# Patient Record
Sex: Female | Born: 1970 | Race: White | Hispanic: No | Marital: Married | Smoking: Former smoker
Health system: Southern US, Community
[De-identification: ages and names within clinical notes are randomized; demographics above are authoritative.]

## PROBLEM LIST (undated history)

## (undated) DIAGNOSIS — E039 Hypothyroidism, unspecified: Secondary | ICD-10-CM

## (undated) DIAGNOSIS — F419 Anxiety disorder, unspecified: Secondary | ICD-10-CM

## (undated) DIAGNOSIS — I10 Essential (primary) hypertension: Secondary | ICD-10-CM

## (undated) DIAGNOSIS — E559 Vitamin D deficiency, unspecified: Secondary | ICD-10-CM

## (undated) DIAGNOSIS — M722 Plantar fascial fibromatosis: Secondary | ICD-10-CM

## (undated) DIAGNOSIS — G4700 Insomnia, unspecified: Secondary | ICD-10-CM

## (undated) DIAGNOSIS — K219 Gastro-esophageal reflux disease without esophagitis: Secondary | ICD-10-CM

## (undated) DIAGNOSIS — F32A Depression, unspecified: Secondary | ICD-10-CM

## (undated) DIAGNOSIS — G4730 Sleep apnea, unspecified: Secondary | ICD-10-CM

## (undated) DIAGNOSIS — F329 Major depressive disorder, single episode, unspecified: Secondary | ICD-10-CM

## (undated) HISTORY — DX: Major depressive disorder, single episode, unspecified: F32.9

## (undated) HISTORY — DX: Depression, unspecified: F32.A

## (undated) HISTORY — PX: MYRINGOTOMY: SUR874

## (undated) HISTORY — DX: Vitamin D deficiency, unspecified: E55.9

## (undated) HISTORY — DX: Plantar fascial fibromatosis: M72.2

## (undated) HISTORY — DX: Insomnia, unspecified: G47.00

## (undated) HISTORY — DX: Sleep apnea, unspecified: G47.30

---

## 2000-10-26 ENCOUNTER — Other Ambulatory Visit: Admission: RE | Admit: 2000-10-26 | Discharge: 2000-10-26 | Payer: Self-pay | Admitting: Gynecology

## 2001-12-25 ENCOUNTER — Emergency Department (HOSPITAL_COMMUNITY): Admission: EM | Admit: 2001-12-25 | Discharge: 2001-12-25 | Payer: Self-pay | Admitting: Emergency Medicine

## 2003-11-04 ENCOUNTER — Ambulatory Visit (HOSPITAL_BASED_OUTPATIENT_CLINIC_OR_DEPARTMENT_OTHER): Admission: RE | Admit: 2003-11-04 | Discharge: 2003-11-04 | Payer: Self-pay | Admitting: Family Medicine

## 2006-04-28 ENCOUNTER — Other Ambulatory Visit: Admission: RE | Admit: 2006-04-28 | Discharge: 2006-04-28 | Payer: Self-pay | Admitting: Obstetrics and Gynecology

## 2009-01-16 ENCOUNTER — Ambulatory Visit: Payer: Self-pay | Admitting: Sports Medicine

## 2009-01-16 DIAGNOSIS — M76899 Other specified enthesopathies of unspecified lower limb, excluding foot: Secondary | ICD-10-CM

## 2010-02-08 ENCOUNTER — Encounter: Payer: Self-pay | Admitting: Podiatry

## 2010-06-05 NOTE — Procedures (Signed)
NAME:  Katherine Lewis, Katherine Lewis                ACCOUNT NO.:  [id number]   MEDICAL RECORD NO.:  [id number]          PATIENT TYPE:  OUT   LOCATION:  SLEEP CENTER                 FACILITY:  [facility]   PHYSICIAN:  Clinton D. Maple Hudson, M.D. DATE OF BIRTH:  [birth year]   DATE OF STUDY:  11/04/2003                              NOCTURNAL POLYSOMNOGRAM   STUDY DATE:  11/04/03   REFERRING PHYSICIAN:  Dario Guardian, M.D.   INDICATION FOR STUDY:  Insomnia with sleep apnea.  Other sleep disturbance.   Epworth Sleepiness score 6/24, neck size 14.5 inches, BMI 34.7, weight 209  pounds.   SLEEP ARCHITECTURE:  Ambien taken at 9:29 p.m. with sleep onset at 10:03  p.m.  She awakened spontaneously from 10:45 p.m. until midnight.  Total  sleep time:  353 minutes with sleep efficiency 77%.  Stage I was 5%, stage  II 72% and stages III and IV 2%.  REM was 22% of total sleep time.  Latency  to sleep onset was 34 minutes.  Latency to REM was 203 minutes.  Awake after  sleep onset 72 minutes.  Arousal index 14.   RESPIRATORY DATA:  RDI 2.7 per hour which is within normal limits.  This  reflected 2 obstructive apneas and 14 hypopnea's.  Events were not  positional.  REM RDI was 6.2 per hour.   OXYGEN DATA:  Mild snoring with oxygen desaturation to a nadir of 86%.  Mean  oxygen saturation through the study was 95% to 96% on room air.   CARDIAC DATA:  Normal cardiac rhythm.   MOVEMENTS/PARASOMNIA:  A total of 111 limb jerks were recorded of which 7  were associated with arousal or awakening.  Poor periodic limb movement with  arousal index of 1.2 per hour which is of doubtful significance.   IMPRESSION/RECOMMENDATION:  Occasional sleep disordered breathing events,  obstructive apneas and hypopneas, at a frequency of 2.7 per hour which is  within normal limits.  Brief oxygen desaturation to 86% with mean saturation  of 95% to 96% on room air.  Minimal periodic limb movement with arousal.  Sleep architecture was  significant for an interval of spontaneous awakening  about 1 hour after sleep onset, despite Ambien.  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                                                           Clinton D. Maple Hudson, M.D.  Diplomate, [employer] Board  CDY/MEDQ  D:  11/10/2003 08:55:18  T:  11/10/2003 19:14:14  Job:  [id number]

## 2011-01-14 ENCOUNTER — Other Ambulatory Visit (HOSPITAL_COMMUNITY)
Admission: RE | Admit: 2011-01-14 | Discharge: 2011-01-14 | Disposition: A | Discharge status: Home / Self Care | Payer: BC Managed Care – PPO | Source: Ambulatory Visit | Attending: Obstetrics and Gynecology | Admitting: Obstetrics and Gynecology

## 2011-01-14 ENCOUNTER — Other Ambulatory Visit: Payer: Self-pay | Admitting: Nurse Practitioner

## 2011-01-14 DIAGNOSIS — Z1159 Encounter for screening for other viral diseases: Secondary | ICD-10-CM | POA: Insufficient documentation

## 2011-01-14 DIAGNOSIS — Z01419 Encounter for gynecological examination (general) (routine) without abnormal findings: Secondary | ICD-10-CM | POA: Insufficient documentation

## 2011-01-14 DIAGNOSIS — N760 Acute vaginitis: Secondary | ICD-10-CM | POA: Insufficient documentation

## 2011-06-11 ENCOUNTER — Ambulatory Visit (INDEPENDENT_AMBULATORY_CARE_PROVIDER_SITE_OTHER): Payer: BC Managed Care – PPO | Admitting: Family Medicine

## 2011-06-11 VITALS — BP 135/100 | Ht 65.0 in | Wt 235.0 lb

## 2011-06-11 DIAGNOSIS — M25561 Pain in right knee: Secondary | ICD-10-CM | POA: Insufficient documentation

## 2011-06-11 DIAGNOSIS — M25569 Pain in unspecified knee: Secondary | ICD-10-CM

## 2011-06-11 NOTE — Assessment & Plan Note (Signed)
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 Injection as above. Continue Aleve as needed. Ace wrap for compression. I will give her some exercises to do. X-ray. I would like to see her back in 3-4 weeks to reassess her symptoms.

## 2011-06-11 NOTE — Progress Notes (Signed)
  Subjective:    Patient ID: Katherine Lewis, female    DOB: 02-02-1970, 41 y.o.   MRN: [id number]  HPI Katherine Lewis is a very pleasant 41 year old female who comes in with approximately 3 week history of right knee pain. She localizes the pain to the anteromedial joint line. She first noted it when getting out of bed one day, and the knee buckled. Since then she has not had any other mechanical symptoms, however it has buckled on prior occasions. She does note some swelling, and as she is moving her furniture over the past couple of days, it is flaring. She has already used some Aleve, which is essentially ineffective. She is desiring a cortisone injection.  Past medical history, surgical history, family history, social history, allergies, and medications reviewed from the medical record and no changes needed. Review of Systems    No fevers, chills, night sweats, weight loss, chest pain, or shortness of breath.  Social History: Non-smoker. Objective:   Physical Exam General:  Well developed, well nourished, and in no acute distress. Neuro:  Alert and oriented x3, extra-ocular muscles intact. Skin: Warm and dry, no rashes noted. Respiratory:  Not using accessory muscles, speaking in full sentences. Musculoskeletal: Right Knee: Mild effusion present. Tender to palpation medial joint line. Range of motion 0-95, pain past this. Ligaments with solid consistent endpoints including ACL, PCL, LCL, MCL. Negative McMurray's, and Thessalonian tests. Non painful patellar compression. Patellar glide without crepitus. Patellar and quadriceps tendons unremarkable. Hamstring and quadriceps strength is normal.   Procedure: right knee injection. Verbal informed consent obtained. Time-out conducted. Noted no overlying erythema, induration, or other signs of local infection. Skin prepped in a sterile fashion. Topical analgesic spray: Ethyl chloride. Joint: right knee Completed without difficulty. Meds: 1 cc  Depo-Medrol 40, 4 cc lidocaine. Pain immediately improved suggesting accurate placement of the medication. Advised to call if fevers/chills, erythema, induration, drainage, or persistent bleeding.     Assessment & Plan:

## 2011-06-18 ENCOUNTER — Ambulatory Visit
Admission: RE | Admit: 2011-06-18 | Discharge: 2011-06-18 | Disposition: A | Discharge status: Home / Self Care | Payer: BC Managed Care – PPO | Source: Ambulatory Visit | Attending: Sports Medicine | Admitting: Sports Medicine

## 2011-06-18 DIAGNOSIS — M25561 Pain in right knee: Secondary | ICD-10-CM

## 2011-06-21 ENCOUNTER — Telehealth: Payer: Self-pay | Admitting: *Deleted

## 2011-06-21 NOTE — Telephone Encounter (Signed)
Spoke with pt- gave her x-ray results.  She is concerned because the injection has not helped her knee pain yet.  Advised sometimes it takes up to 10 days for injections to be effective, and sometimes they do not significantly relieve pain.  She would like Dr. Karie Schwalbe to call her back if he has any further suggestions.  Advised pt I would make sure he sees the x-rays and calls with any further instructions.

## 2011-06-21 NOTE — Telephone Encounter (Signed)
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  The next step if the steroid injection didn't work is a Management consultant of 5 shots qweekly.  If she would like to proceed then please set this up and I can start the series immediately.  Thanks!

## 2011-06-21 NOTE — Telephone Encounter (Signed)
Left pt a voicemail to return my call.

## 2011-06-22 NOTE — Telephone Encounter (Signed)
Called pt.  Left VM to return call.

## 2011-06-24 NOTE — Telephone Encounter (Signed)
Spoke with pt- she wants to do suppartz.  Scheduled her for next tues with Dr. Karie Schwalbe.

## 2011-06-25 NOTE — Telephone Encounter (Signed)
Can you make sure we have supartz available? Preesh!

## 2011-06-29 ENCOUNTER — Encounter: Payer: Self-pay | Admitting: Sports Medicine

## 2011-06-29 ENCOUNTER — Ambulatory Visit (INDEPENDENT_AMBULATORY_CARE_PROVIDER_SITE_OTHER): Payer: BC Managed Care – PPO | Admitting: Sports Medicine

## 2011-06-29 VITALS — BP 143/92 | HR 79

## 2011-06-29 DIAGNOSIS — M25561 Pain in right knee: Secondary | ICD-10-CM

## 2011-06-29 DIAGNOSIS — M25569 Pain in unspecified knee: Secondary | ICD-10-CM

## 2011-06-29 NOTE — Progress Notes (Signed)
Patient ID: Amalea Ottey, female   DOB: [birth year], 41 y.o.   MRN: [id number]  Subjective:   WU:[id number] right knee pain.  HPI:Thelma was seen here several weeks ago with pain that she localizes along the medial tibiofemoral joint line. At that point she was getting occasional buckling. X-rays showed medial compartment as well as patellofemoral compartment mild DJD. I injected her knee, unfortunately she only had an hour or so of benefit.  She returns here today for consideration of Visco supplementation, however today she is noting some locking of the knee. She still does not have any effusion or swelling.   Past medical history, surgical history, family history, social history, allergies, and medications reviewed from the medical record and no changes needed.  Review of Systems: No fevers, chills, night sweats, weight loss, chest pain, or shortness of breath.    Objective:  General:  Well Developed, well nourished, and in no acute distress. Neuro:  Alert and oriented x3, extra-ocular muscles intact. Skin: Warm and dry, no rashes noted. Respiratory:  Not using accessory muscles, speaking in full sentences. Musculoskeletal: Tender to palpation along the medial joint line. Otherwise full range of motion, and all ligamentous structures are stable and intact. She has a negative McMurray's.  Assessment & Plan:

## 2011-06-29 NOTE — Patient Instructions (Addendum)
You have been scheduled for an appointment for MRI of your knee at [facility] on 07/01/11 at 6:30 am.  The office is located at [address] and the phone number is [phone].

## 2011-06-29 NOTE — Assessment & Plan Note (Addendum)
Katherine Lewis continues to have knee pain, and is now with locking. I do suspect that she may have some meniscal pathology, as well as a plica. Certainly she will also have some chondromalacia in the tibiofemoral as well as patellofemoral compartments. Should she have a meniscal tear, it may be prudent to send her for an arthroscopy, however if there is only degenerative changes, she would certainly be an even stronger candidate for Visco supplementation. She will return to see [place] for followup of her MRI results.

## 2011-07-01 ENCOUNTER — Ambulatory Visit
Admission: RE | Admit: 2011-07-01 | Discharge: 2011-07-01 | Disposition: A | Discharge status: Home / Self Care | Payer: BC Managed Care – PPO | Source: Ambulatory Visit | Attending: Sports Medicine | Admitting: Sports Medicine

## 2011-07-01 DIAGNOSIS — M25561 Pain in right knee: Secondary | ICD-10-CM

## 2011-07-06 ENCOUNTER — Telehealth: Payer: Self-pay | Admitting: *Deleted

## 2011-07-06 NOTE — Telephone Encounter (Signed)
Left pt a VM to return my call  

## 2011-07-06 NOTE — Telephone Encounter (Signed)
Message copied by Mora Bellman on Tue Jul 06, 2011  3:35 PM ------      Message from: Monica Becton      Created: Tue Jul 06, 2011  1:20 PM      Regarding: RE: Shirlee Latch results      Contact: [phone]       I will be seeing patients till 10pm tonight, let her know the MRI showed a dent in the cartilage and stress injuries.  No further intervention needed yet but if painful when walking needs to be NWB on crutches or the rolling knee cruiser thing.            Ihor Austin. Benjamin Stain, M.D.                   ----- Message -----         From: Mora Bellman, RN         Sent: 07/05/2011   3:15 PM           To: Monica Becton, MD      Subject: Annell GreeningShirlee Latch results                                                      ----- Message -----         From: Lizbeth Bark         Sent: 07/05/2011   3:06 PM           To: Mora Bellman, RN      Subject: mri results

## 2011-07-07 NOTE — Telephone Encounter (Signed)
Called pt- gave her message from Dr. Karie Schwalbe.  She states she does not think she will use crutches, and she cannot put any pressure on knee so she does not think the knee cruiser would work.  Per Dr. Karie Schwalbe she must be weight bearing for injury to heal, crutches or wheel chair are best options.  Called pt to let her know this, left a VM.

## 2011-07-09 NOTE — Telephone Encounter (Signed)
Left pt a VM to return my call 07/08/11 at 07/09/11.

## 2011-07-12 NOTE — Telephone Encounter (Signed)
Left pt a message to return my call.

## 2011-07-13 NOTE — Telephone Encounter (Signed)
Spoke with pt- she says she will use crutches to be non-weight bearing.  Advised her she can get these at [facility] supply on [address].  Also advised pt to schedule a f/u appt the week of 08/04/11 with Dr. Margaretha Sheffield, and she should be completely non weight bearing on rt leg until then.

## 2011-08-05 ENCOUNTER — Ambulatory Visit (INDEPENDENT_AMBULATORY_CARE_PROVIDER_SITE_OTHER): Payer: BC Managed Care – PPO | Admitting: Sports Medicine

## 2011-08-05 ENCOUNTER — Encounter: Payer: Self-pay | Admitting: Sports Medicine

## 2011-08-05 VITALS — BP 141/89 | HR 78

## 2011-08-05 DIAGNOSIS — M25561 Pain in right knee: Secondary | ICD-10-CM

## 2011-08-05 DIAGNOSIS — M25569 Pain in unspecified knee: Secondary | ICD-10-CM

## 2011-08-05 NOTE — Patient Instructions (Addendum)
You have an osteochondral injury to your knee It is important for you to be nonweightbearing in order for this to heal Use your Ace wrap for compression It is okay to continue using naproxen See me again in 6 weeks. Make sure you get your x-rays done the day before that visit.

## 2011-08-06 NOTE — Progress Notes (Signed)
  Subjective:    Patient ID: Katherine Lewis, female    DOB: 1970-09-01, 41 y.o.   MRN: [id number]  HPI chief complaint: Followup on right knee pain  Patient comes in today for followup on her right knee. A recent MRI showed significant edema in the lateral aspect of the medial femoral condyle. This appears to be a stress reaction or potentially early osteonecrosis of the bone. No meniscal tear. No significant degenerative changes. Knee has been symptomatic now for about 3 months. No known trauma. She does get intermittent swelling. She admits that she has been somewhat noncompliant with her nonweightbearing status. She is supposed to be using crutches. Despite this, she feels like her symptoms are improving. Still getting pain and swelling and she's up on her legs too long.    Review of Systems     Objective:   Physical Exam Well-developed, well-nourished. No acute distress. Right knee: Range of motion is 0-110-120. Trace effusion. Slight tenderness along the medial joint line but a negative McMurray's. Knee is stable ligamentous exam. Neurovascular intact distally. Appears to walk with only a slight limp.  MRI is as above      Assessment & Plan:  #1. Right knee pain with MRI evidence of medial femoral condyle edema concerning for possible early osteonecrosis versus stress reaction  I reviewed the MRI scan with the patient. I explained to her my concern that this may be early osteonecrosis and that she should continue to be nonweightbearing on crutches, especially if she is having symptoms. She's taking naproxen for pain and swelling. She can continue with this. We will also give her Ace wrap for compression. I'll see the patient back in 6 weeks for followup. I will check an AP, lateral, and tunnel view plain x-ray prior to that visit. The possibility of Visco supplementation had been raised at an earlier visit. I do not see significant degenerative changes in with the edema in the medial  femoral condyle, I do not believe this will be helpful.

## 2011-09-08 ENCOUNTER — Other Ambulatory Visit: Payer: Self-pay | Admitting: Sports Medicine

## 2011-09-08 ENCOUNTER — Ambulatory Visit
Admission: RE | Admit: 2011-09-08 | Discharge: 2011-09-08 | Disposition: A | Discharge status: Home / Self Care | Payer: BC Managed Care – PPO | Source: Ambulatory Visit | Attending: Sports Medicine | Admitting: Sports Medicine

## 2011-09-08 DIAGNOSIS — M25561 Pain in right knee: Secondary | ICD-10-CM

## 2011-09-14 ENCOUNTER — Ambulatory Visit (INDEPENDENT_AMBULATORY_CARE_PROVIDER_SITE_OTHER): Payer: BC Managed Care – PPO | Admitting: Sports Medicine

## 2011-09-14 VITALS — BP 136/90 | Ht 65.0 in | Wt 235.0 lb

## 2011-09-14 DIAGNOSIS — M8430XA Stress fracture, unspecified site, initial encounter for fracture: Secondary | ICD-10-CM

## 2011-09-14 DIAGNOSIS — M25569 Pain in unspecified knee: Secondary | ICD-10-CM

## 2011-09-14 MED ORDER — MELOXICAM 15 MG PO TABS: 15.0000 mg | ORAL_TABLET | Freq: Every day | ORAL | Status: DC

## 2011-09-14 NOTE — Progress Notes (Signed)
  Subjective:    Patient ID: Katherine Lewis, female    DOB: November 08, 1970, 41 y.o.   MRN: [id number]  HPI Lexus comes in today for followup on her right knee. She's been followed for a stress reaction in the medial femoral condyle. She was completely pain-free up until about a week ago. That was when she decided to wean off of her crutches. Since then she's had returning pain and swelling. She's noticed some stiffness in the knee as well as some muscle weakness. Pain is a little different in nature than what she was experiencing previous. Some catching and popping. She's taking intermittent naproxen.  Plain x-rays done last week showed no evidence of OCD or osteonecrosis.    Review of Systems     Objective:   Physical Exam Well-developed, well-nourished. No acute distress.  Right knee: Range of motion 0-100. Trace to 1+ effusion. Slight tenderness along the medial femoral condyle but not marked. No tenderness laterally. Negative McMurray's. Knee is stable to ligamentous exam. She walks with only a minimal limp and is neurovascular intact distally.       Assessment & Plan:  1. Right knee pain secondary to medial femoral condyle stress reaction versus possible early osteonecrosis  Patient will resume nonweightbearing status for 2 additional week. Prescription for Mobic 15 mg daily with food for the next 2 weeks. In 2 weeks she will start physical therapy at [facility] and will followup with me in 6 weeks. If symptoms persist, I may need to repeat her MRI scan to ensure that she has not developing osteonecrosis. She will let me know if she has problems in the interim.

## 2012-02-10 ENCOUNTER — Other Ambulatory Visit: Payer: Self-pay | Admitting: Family Medicine

## 2012-02-10 DIAGNOSIS — Z1231 Encounter for screening mammogram for malignant neoplasm of breast: Secondary | ICD-10-CM

## 2012-02-24 ENCOUNTER — Ambulatory Visit: Payer: BC Managed Care – PPO

## 2012-02-25 ENCOUNTER — Ambulatory Visit: Payer: BC Managed Care – PPO

## 2012-03-09 ENCOUNTER — Ambulatory Visit
Admission: RE | Admit: 2012-03-09 | Discharge: 2012-03-09 | Disposition: A | Discharge status: Home / Self Care | Payer: BC Managed Care – PPO | Source: Ambulatory Visit | Attending: Family Medicine | Admitting: Family Medicine

## 2012-03-31 ENCOUNTER — Encounter: Payer: Self-pay | Admitting: Pulmonary Disease

## 2012-03-31 ENCOUNTER — Encounter: Payer: Self-pay | Admitting: *Deleted

## 2012-04-03 ENCOUNTER — Ambulatory Visit (INDEPENDENT_AMBULATORY_CARE_PROVIDER_SITE_OTHER): Payer: BC Managed Care – PPO | Admitting: Pulmonary Disease

## 2012-04-03 ENCOUNTER — Encounter: Payer: Self-pay | Admitting: Pulmonary Disease

## 2012-04-03 VITALS — BP 142/98 | HR 84 | Temp 98.0°F | Ht 64.0 in | Wt 253.8 lb

## 2012-04-03 DIAGNOSIS — R05 Cough: Secondary | ICD-10-CM | POA: Insufficient documentation

## 2012-04-03 MED ORDER — PANTOPRAZOLE SODIUM 40 MG PO TBEC: 40.0000 mg | DELAYED_RELEASE_TABLET | Freq: Two times a day (BID) | ORAL | Status: DC

## 2012-04-03 NOTE — Progress Notes (Signed)
Subjective:    Patient ID: Katherine Lewis, female    DOB: 1970-12-19, 42 y.o.   MRN: [id number]  HPI The patient is a 42 year old female who I have been asked to see for recurrent pulmonary symptoms.  For the last one year, the patient states that she has had 7 episodes that involved pulmonary symptoms, and each lasts a prolonged period of time.  She describes them as a feeling of fatigue, followed by a persistent dry hacking cough with occasional mucus in small amounts.  She does not expectorate the mucus, and therefore is unsure of its color.  She also has sinus congestion with this, and some purulence from her nose.  She also has postnasal drip with this.  The past 2 episodes, she has developed some chest congestion and rattling with some increased shortness of breath.  She will typically have symptoms for 3-4 weeks, they will slowly improved, and then they recur in a few weeks.  She has been on numerous antibiotics, and really does not feel they help.  She is also been on prednisone.  She currently has a tickle in her throat with throat clearing, but feels this has not started until she has been on the inhaler.  She feels that her cough is better since she has been on the antihistamine, proton pump inhibitor, and the dulera, but it is unclear which has helped her the most.  She denies any issues with postnasal drip in between her episodes.  She has had intermittent reflux symptoms, but no chronic GERD symptoms.  She has no history of asthma as a child or during adult, and denies any allergy issues.  She has had a few issues with sinus infections.  She has had a chest x-ray in December of last year that showed small lung volumes secondary to obesity, as well as prominent bronchovascular markings.  She has not had pulmonary function studies.   Review of Systems  Constitutional: Negative for fever and unexpected weight change.  HENT: Positive for congestion, sore throat, rhinorrhea, sneezing and postnasal  drip. Negative for ear pain, nosebleeds, trouble swallowing, dental problem and sinus pressure.   Eyes: Negative for redness and itching.  Respiratory: Positive for cough and shortness of breath. Negative for chest tightness and wheezing.   Cardiovascular: Negative for palpitations and leg swelling.  Gastrointestinal: Negative for nausea and vomiting.  Genitourinary: Negative for dysuria.  Musculoskeletal: Positive for joint swelling.  Skin: Negative for rash.  Neurological: Positive for headaches.  Hematological: Does not bruise/bleed easily.  Psychiatric/Behavioral: Negative for dysphoric mood. The patient is not nervous/anxious.        Objective:   Physical Exam Constitutional:  Obese female, no acute distress  HENT:  Nares patent without discharge, but mild mucosal erythema  Oropharynx without exudate, palate and uvula are elongated with mild tonsillar hypertrophy  Eyes:  Perrla, eomi, no scleral icterus  Neck:  No JVD, no TMG  Cardiovascular:  Normal rate, regular rhythm, no rubs or gallops.  2/6 sem        Intact distal pulses  Pulmonary :  Normal breath sounds, no stridor or respiratory distress   No rales, rhonchi, or wheezing  Abdominal:  Soft, nondistended, bowel sounds present.  No tenderness noted.   Musculoskeletal:  No lower extremity edema noted.  Lymph Nodes:  No cervical lymphadenopathy noted  Skin:  No cyanosis noted  Neurologic:  Alert, appropriate, moves all 4 extremities without obvious deficit.         Assessment &  Plan:

## 2012-04-03 NOTE — Assessment & Plan Note (Signed)
The patient is having recurrent episodes of chronic cough that may or may not be related to lower airway disease.  Based on her history and exam, I am suspicious that she has more of an upper airway issue, and this can include chronic sinus disease, allergies with postnasal drip, and finally laryngopharyngeal reflux.  I am most suspicious of the latter.  I would like to discontinue her current inhaler, and treat her more aggressively for postnasal drip and reflux.  I will see her back in 3 weeks, and we'll check spirometry at that time looking for evidence for airflow obstruction.  The patient is agreeable with this approach.

## 2012-04-03 NOTE — Patient Instructions (Addendum)
Stop dulera starting today Would take protonix 40mg  one in am AND pm for next 3 weeks. Get chlorpheniramine 4mg  otc and take 2 at bedtime.  Can also continue claritin in the am if still having postnasal drip Try to avoid throat clearing if this is an issue for you, and can use hard candy during the day to help with this.  Will see you back in 3 weeks.

## 2012-04-19 ENCOUNTER — Other Ambulatory Visit: Payer: Self-pay | Admitting: Pulmonary Disease

## 2012-04-20 NOTE — Telephone Encounter (Signed)
LMOM x 1  Patient needs appt for 3 week follow up. Patient is requesting refill of Pantoprazole 40mg  but in [facility] note it states that the pt was to take this medication for the next 3 weeks then follow up to review with KC.  Looks as though when patient was here for OV she never made her f/u appt.

## 2012-04-27 NOTE — Telephone Encounter (Signed)
KC, pt is refusing appt due to high co pay Please advise thanks

## 2012-04-27 NOTE — Telephone Encounter (Signed)
Pt returned call. I offered to schedule an rov. However pt states that the med is working "fine" and she couldn't really afford a 100.00 co-pay at this time. Call her back at the same # as before. Katherine Lewis

## 2012-04-28 ENCOUNTER — Telehealth: Payer: Self-pay | Admitting: *Deleted

## 2012-04-28 ENCOUNTER — Telehealth: Payer: Self-pay | Admitting: Pulmonary Disease

## 2012-04-28 NOTE — Telephone Encounter (Signed)
ATC patient, no answer LMOMTCB 

## 2012-04-28 NOTE — Telephone Encounter (Signed)
Error

## 2012-04-28 NOTE — Telephone Encounter (Signed)
Spoke with patient, refusing to make 3wk f/u appt. Patient wants refills of Protonix. States that her symptoms have improved; she never took Chlorpheniramine bc she states she does not have allergies. Pt states that she is taking Protonix bid  FYI patient seen by Laurann [place] PCP on regular basis.  KC please advise if okay for refills. Thanks

## 2012-04-28 NOTE — Telephone Encounter (Signed)
If she is doing that much better, would give her another 30 days of protonix with no refills, and she doesn't need further pulmonary followup.  That is a medication her primary can fill in the future for her since it seems to have helped.

## 2012-05-01 NOTE — Telephone Encounter (Signed)
Did you call this patient? Katherine Lewis, CMA

## 2012-05-01 NOTE — Telephone Encounter (Signed)
Pt states she is returning a call from [place].  She can be reached @ [phone]. Leanora Ivanoff

## 2012-05-01 NOTE — Telephone Encounter (Signed)
Yes this is a duplicate message. I have attempted to call patient in regards to meds that have been called into pharm and recs per [facility]. Patient is very hard to get in touch with.  Please refer to refill message encounter. (04/19/12) Pantoprazole has already been sent to patients [facility] on 04/28/12  #30 with NO REFILLS per KC> Pt needs to follow up with PCP for refills on this medication if in need of further refills.

## 2012-05-03 ENCOUNTER — Telehealth: Payer: Self-pay | Admitting: Pulmonary Disease

## 2012-05-03 NOTE — Telephone Encounter (Signed)
Per 4.11.14 phone note: Nita Sells, CMA at 05/01/2012 3:51 PM     Yes this is a duplicate message. I have attempted to call patient in regards to meds that have been called into pharm and recs per [facility]. Patient is very hard to get in touch with.  Please refer to refill message encounter. (04/19/12)  Pantoprazole has already been sent to patients Pharm[facility] on 04/28/12 #30 with NO REFILLS per KC> Pt needs to follow up with PCP for refills on this medication if in need of further refills.    Called [place], spoke with Lawson Fiscal who verified that they have an order on hold for pt for this pantoprazole, but it is for a 15-day supply > pt takes twice daily Advised Lawson Fiscal this amount is correct and requested a note be added that future refill requests are to go to pt's PCP - Lawson Fiscal verbalized her understanding Called spoke with patient, advised of the above Pt very grateful and verbalized her understanding and seek ensure that she sees her PCP for future refills Nothing further needed; will sign off

## 2012-08-20 ENCOUNTER — Ambulatory Visit (INDEPENDENT_AMBULATORY_CARE_PROVIDER_SITE_OTHER): Payer: BC Managed Care – PPO | Admitting: Family Medicine

## 2012-08-20 DIAGNOSIS — S81851A Open bite, right lower leg, initial encounter: Secondary | ICD-10-CM

## 2012-08-20 DIAGNOSIS — M79609 Pain in unspecified limb: Secondary | ICD-10-CM

## 2012-08-20 DIAGNOSIS — Z23 Encounter for immunization: Secondary | ICD-10-CM

## 2012-08-20 DIAGNOSIS — S81809A Unspecified open wound, unspecified lower leg, initial encounter: Secondary | ICD-10-CM

## 2012-08-20 MED ORDER — AMOXICILLIN-POT CLAVULANATE 875-125 MG PO TABS: 1.0000 | ORAL_TABLET | Freq: Two times a day (BID) | ORAL | Status: DC

## 2012-08-20 NOTE — Patient Instructions (Addendum)

## 2012-08-20 NOTE — Progress Notes (Signed)
[address]   [place], [place]  [address]   [phone]  Subjective:    Patient ID: Katherine Lewis, female    DOB: 05-Aug-1970, 42 y.o.   MRN: [id number]  HPI This 42 y.o. female presents for evaluation of cat bite.  Cat bite this morning.  Stepped on cat and he bit her.  R posterior calf.  Washed with soap and water; applied Neosporin to area.  Last Tetanus vaccine unknown.  Cats immunizations UTD; behavior normal; inside cat.    Review of Systems  Constitutional: Negative for chills, diaphoresis and fatigue.  Skin: Positive for wound. Negative for color change, pallor and rash.  Neurological: Negative for weakness and numbness.  Hematological: Does not bruise/bleed easily.    Past Medical History  Diagnosis Date  . Depression   . Insomnia   . Plantar fasciitis   . Vitamin D deficiency   . Sleep apnea     Past Surgical History  Procedure Laterality Date  . No past surgeries      Prior to Admission medications   Medication Sig Start Date End Date Taking? Authorizing Provider  buPROPion (WELLBUTRIN XL) 150 MG 24 hr tablet Take 150 mg by mouth daily.   Yes Historical Provider, MD  ergocalciferol (VITAMIN D2) 50000 UNITS capsule Take 50,000 Units by mouth once a week.   Yes Historical Provider, MD  escitalopram (LEXAPRO) 5 MG tablet Take 5 mg by mouth daily.   Yes Historical Provider, MD  levonorgestrel (MIRENA) 20 MCG/24HR IUD 1 each by Intrauterine route once.   Yes Historical Provider, MD  pantoprazole (PROTONIX) 40 MG tablet TAKE 1 TABLET TWICE DAILY. 04/19/12  Yes Barbaraann Share, MD  amoxicillin-clavulanate (AUGMENTIN) 875-125 MG per tablet Take 1 tablet by mouth 2 (two) times daily. 08/20/12   Ethelda Chick, MD  mometasone-formoterol (DULERA) 100-5 MCG/ACT AERO Inhale 2 puffs into the lungs 2 (two) times daily.    Historical Provider, MD    Allergies  Allergen Reactions  . Codeine     History   Social History  . Marital Status: Single    Spouse Name: N/A    Number  of Children: N/A  . Years of Education: N/A   Occupational History  . Educator [employer]   Social History Main Topics  . Smoking status: Former Smoker -- 0.50 packs/day for 6 years    Types: Cigarettes    Quit date: 01/19/1992  . Smokeless tobacco: Never Used     Comment: social   . Alcohol Use: Yes     Comment: social   . Drug Use: No  . Sexually Active: Not on file   Other Topics Concern  . Not on file   Social History Narrative  . No narrative on file    Family History  Problem Relation Age of Onset  . Lung cancer Paternal Grandmother   . Lung cancer Paternal Grandfather   . Diabetes type II Mother   . Diabetes Mother        Objective:   Physical Exam  Nursing note and vitals reviewed. Constitutional: She is oriented to person, place, and time. She appears well-developed and well-nourished. No distress.  Neurological: She is alert and oriented to person, place, and time.  Skin: She is not diaphoretic.  RLE lateral-posterior calf with superficial abrasion linear distribution with adjacent puncture wound with dried residual blood.     TDAP administered by Georg Ruddle, CMA.    Assessment & Plan:  Pain, lower  leg, right  Cat bite of lower leg, right, initial encounter  1. Pain RLE:  New.  Secondary to cat bite; recommend Tylenol or Motrin. 2.  Cat bite RLE:  New.  Cleansed in office with peroxide; bandage applied.  S/p TDAP.  Rx for Augmentin provided if becomes painful, red, drainage, swelling.  Meds ordered this encounter  Medications  . DISCONTD: amoxicillin-clavulanate (AUGMENTIN) 875-125 MG per tablet    Sig: Take 1 tablet by mouth 2 (two) times daily.    Dispense:  20 tablet    Refill:  0  . amoxicillin-clavulanate (AUGMENTIN) 875-125 MG per tablet    Sig: Take 1 tablet by mouth 2 (two) times daily.    Dispense:  20 tablet    Refill:  0

## 2013-02-18 ENCOUNTER — Ambulatory Visit (INDEPENDENT_AMBULATORY_CARE_PROVIDER_SITE_OTHER): Payer: BC Managed Care – PPO | Admitting: Internal Medicine

## 2013-02-18 VITALS — BP 112/76 | HR 82 | Temp 98.1°F | Resp 16 | Ht 64.0 in | Wt 246.6 lb

## 2013-02-18 DIAGNOSIS — J019 Acute sinusitis, unspecified: Secondary | ICD-10-CM

## 2013-02-18 MED ORDER — AMOXICILLIN-POT CLAVULANATE 875-125 MG PO TABS: 1.0000 | ORAL_TABLET | Freq: Two times a day (BID) | ORAL | Status: DC

## 2013-02-18 NOTE — Progress Notes (Addendum)
   Subjective:    Patient ID: Katherine Lewis, female    DOB: [birth year], 43 y.o.   MRN: [id number] This chart was scribed for Tami Lin, MD by Vernell Barrier, Medical Scribe. This patient's care was started at 2:49 PM.  Sore Throat    HPI Comments: Katherine Lewis is a 43 y.o. female who presents to the [facility] complaining of constant sore throat, bloody /brown colored mucous with mild cough, onset 2 weeks ago. Pt may have had a fever but is not sure. Pt states she has felt bad for a while but not bad enough to see a doctor. Expresses no difficulty sleeping. Denies nasal pressure.  Review of Systems  Objective:   Physical Exam  Nursing note and vitals reviewed. Constitutional: She is oriented to person, place, and time. She appears well-developed and well-nourished. No distress.  HENT:  Head: Normocephalic and atraumatic.  Right Ear: External ear normal.  Left Ear: External ear normal.  Mild inflammation in the throat. Purulent  Discharge sinuses Lmax tend perc  Eyes: Conjunctivae and EOM are normal. Pupils are equal, round, and reactive to light.  Neck: Neck supple. No thyromegaly present.  Cardiovascular: Normal rate.   Pulmonary/Chest: Effort normal. No respiratory distress. She has no wheezes. She has no rales.  Musculoskeletal: Normal range of motion.  Lymphadenopathy:    She has no cervical adenopathy.  Neurological: She is alert and oriented to person, place, and time.  Skin: Skin is warm and dry.  Psychiatric: She has a normal mood and affect. Her behavior is normal.   Assessment & Plan:  Pt will be treated with Augmentin for 10 days. Pt is advised she can take probiotics if the antibiotic gives her any abdominal discomfort.  I have completed the patient encounter in its entirety as documented by the scribe, with editing by me where necessary. Sharnika Binney P. Laney Pastor, M.D.  Acute sinusitis, unspecified  Meds ordered this encounter    Medications  . amoxicillin-clavulanate (AUGMENTIN) 875-125 MG per tablet    Sig: Take 1 tablet by mouth 2 (two) times daily.    Dispense:  20 tablet    Refill:  0

## 2013-04-11 ENCOUNTER — Telehealth: Payer: Self-pay

## 2013-04-11 NOTE — Telephone Encounter (Signed)
Request came in to release immunizatons to [employer] at [place].   Waiting for authorization from patient to confirm she is okay with release   [phone]

## 2013-04-11 NOTE — Telephone Encounter (Signed)
Pt returned call and stated that she is authorizing [place] to release her immunizations to Harlan Stains at [place]. Thank you

## 2013-04-17 NOTE — Telephone Encounter (Signed)
Faxed records from [place].

## 2013-08-04 ENCOUNTER — Ambulatory Visit (INDEPENDENT_AMBULATORY_CARE_PROVIDER_SITE_OTHER): Payer: BC Managed Care – PPO | Admitting: Emergency Medicine

## 2013-08-04 VITALS — BP 142/96 | HR 85 | Temp 98.2°F | Resp 18 | Ht 64.5 in | Wt 256.8 lb

## 2013-08-04 DIAGNOSIS — J209 Acute bronchitis, unspecified: Secondary | ICD-10-CM

## 2013-08-04 DIAGNOSIS — R109 Unspecified abdominal pain: Secondary | ICD-10-CM

## 2013-08-04 LAB — CBC WITH DIFFERENTIAL/PLATELET
BASOS PCT: 0 % (ref 0–1)
Basophils Absolute: 0 10*3/uL (ref 0.0–0.1)
EOS ABS: 0.2 10*3/uL (ref 0.0–0.7)
EOS PCT: 2 % (ref 0–5)
HCT: 39.8 % (ref 36.0–46.0)
Hemoglobin: 13.9 g/dL (ref 12.0–15.0)
Lymphocytes Relative: 24 % (ref 12–46)
Lymphs Abs: 1.9 10*3/uL (ref 0.7–4.0)
MCH: 30.2 pg (ref 26.0–34.0)
MCHC: 34.9 g/dL (ref 30.0–36.0)
MCV: 86.5 fL (ref 78.0–100.0)
MONOS PCT: 7 % (ref 3–12)
Monocytes Absolute: 0.6 10*3/uL (ref 0.1–1.0)
NEUTROS PCT: 67 % (ref 43–77)
Neutro Abs: 5.4 10*3/uL (ref 1.7–7.7)
PLATELETS: 271 10*3/uL (ref 150–400)
RBC: 4.6 MIL/uL (ref 3.87–5.11)
RDW: 13.2 % (ref 11.5–15.5)
WBC: 8.1 10*3/uL (ref 4.0–10.5)

## 2013-08-04 LAB — COMPREHENSIVE METABOLIC PANEL
ALK PHOS: 59 U/L (ref 39–117)
ALT: 29 U/L (ref 0–35)
AST: 18 U/L (ref 0–37)
Albumin: 4 g/dL (ref 3.5–5.2)
BILIRUBIN TOTAL: 0.6 mg/dL (ref 0.2–1.2)
BUN: 10 mg/dL (ref 6–23)
CALCIUM: 8.7 mg/dL (ref 8.4–10.5)
CHLORIDE: 100 meq/L (ref 96–112)
CO2: 29 mEq/L (ref 19–32)
CREATININE: 0.85 mg/dL (ref 0.50–1.10)
Glucose, Bld: 93 mg/dL (ref 70–99)
Potassium: 3.9 mEq/L (ref 3.5–5.3)
Sodium: 139 mEq/L (ref 135–145)
Total Protein: 6.5 g/dL (ref 6.0–8.3)

## 2013-08-04 LAB — POCT UA - MICROSCOPIC ONLY
CASTS, UR, LPF, POC: NEGATIVE
Crystals, Ur, HPF, POC: NEGATIVE
MUCUS UA: NEGATIVE
YEAST UA: NEGATIVE

## 2013-08-04 LAB — POCT URINALYSIS DIPSTICK
BILIRUBIN UA: NEGATIVE
Glucose, UA: NEGATIVE
LEUKOCYTES UA: NEGATIVE
NITRITE UA: NEGATIVE
PH UA: 5.5
RBC UA: NEGATIVE
Spec Grav, UA: 1.015
UROBILINOGEN UA: 0.2

## 2013-08-04 LAB — LIPASE: Lipase: 11 U/L (ref 0–75)

## 2013-08-04 LAB — AMYLASE: Amylase: 25 U/L (ref 0–105)

## 2013-08-04 MED ORDER — AZITHROMYCIN 250 MG PO TABS: ORAL_TABLET | ORAL | Status: DC

## 2013-08-04 NOTE — Patient Instructions (Signed)

## 2013-08-04 NOTE — Progress Notes (Signed)
[facility] [address], [place] [address] [phone]  Date:  08/04/2013   Name:  Katherine Lewis   DOB:  06/05/1970   MRN:  [id number]  PCP:  Vidal Schwalbe, MD    Chief Complaint: Generalized Body Aches, Nausea and Headache   History of Present Illness:  Katherine Lewis is a 43 y.o. very pleasant female patient who presents with the following:  Ill since Monday with nausea and diarrhea.  Was dizzy.  Forced to sit down and rest.  Had a fever and chills.  Loose stools only on Monday.  The patient has no complaint of blood, mucous, or pus in her stools.. Continues to have dizziness and that is agravating her nausea.  No further diarrhea documented fever.  Appetite is mostly normal.  Pain in upper abdomen.   Some chronic food intolerance.  No improvement with over the counter medications or other home remedies. Denies other complaint or health concern today.   Patient Active Problem List   Diagnosis Date Noted  . Chronic cough 04/03/2012  . Right knee pain 06/11/2011  . TROCHANTERIC BURSITIS, LEFT 01/16/2009    Past Medical History  Diagnosis Date  . Depression   . Insomnia   . Plantar fasciitis   . Vitamin D deficiency   . Sleep apnea     Past Surgical History  Procedure Laterality Date  . No past surgeries      History  Substance Use Topics  . Smoking status: Former Smoker -- 0.50 packs/day for 6 years    Types: Cigarettes    Quit date: 01/19/1992  . Smokeless tobacco: Never Used     Comment: social   . Alcohol Use: Yes     Comment: social     Family History  Problem Relation Age of Onset  . Lung cancer Paternal Grandmother   . Lung cancer Paternal Grandfather   . Diabetes type II Mother   . Diabetes Mother     Allergies  Allergen Reactions  . Codeine Nausea Only    Medication list has been reviewed and updated.  Current Outpatient Prescriptions on File Prior to Visit  Medication Sig Dispense Refill  . buPROPion (WELLBUTRIN  XL) 150 MG 24 hr tablet Take 150 mg by mouth daily.      Marland Kitchen escitalopram (LEXAPRO) 5 MG tablet Take 5 mg by mouth daily.      Marland Kitchen levonorgestrel (MIRENA) 20 MCG/24HR IUD 1 each by Intrauterine route once.      . pantoprazole (PROTONIX) 40 MG tablet TAKE 1 TABLET TWICE DAILY.  30 tablet  0   No current facility-administered medications on file prior to visit.    Review of Systems:  As per HPI, otherwise negative.    Physical Examination: Filed Vitals:   08/04/13 1141  BP: 142/96  Pulse: 85  Temp: 98.2 F (36.8 C)  Resp: 18   Filed Vitals:   08/04/13 1141  Height: 5' 4.5" (1.638 m)  Weight: 256 lb 12.8 oz (116.484 kg)   Body mass index is 43.41 kg/(m^2). Ideal Body Weight: Weight in (lb) to have BMI = 25: 147.6  GEN: morbid obesity, NAD, Non-toxic, A & O x 3 HEENT: Atraumatic, Normocephalic. Neck supple. No masses, No LAD. Ears and Nose: No external deformity. CV: RRR, No M/G/R. No JVD. No thrill. No extra heart sounds. PULM: CTA B, no wheezes, crackles, rhonchi. No retractions. No resp. distress. No accessory muscle use. ABD: S, NT, ND, +BS. No rebound. No  HSM. EXTR: No c/c/e NEURO Normal gait.  PSYCH: Normally interactive. Conversant. Not depressed or anxious appearing.  Calm demeanor.    Assessment and Plan: Bronchitis zpak Abdominal discomfort  Labs  Signed,  Ellison Carwin, MD

## 2013-08-07 ENCOUNTER — Telehealth: Payer: Self-pay

## 2013-08-07 MED ORDER — FLUCONAZOLE 150 MG PO TABS: ORAL_TABLET | ORAL | Status: DC

## 2013-08-07 NOTE — Telephone Encounter (Signed)
Sent in Diflucan per protocol- Can pt have a script for Tessalon?

## 2013-08-07 NOTE — Telephone Encounter (Signed)
Patient requesting Diflucan two dozes.  Also Tessolon Pearl Drops for cough.   [employer]   [phone]

## 2013-12-19 ENCOUNTER — Other Ambulatory Visit: Payer: Self-pay

## 2013-12-19 DIAGNOSIS — Z1231 Encounter for screening mammogram for malignant neoplasm of breast: Secondary | ICD-10-CM

## 2014-01-02 ENCOUNTER — Ambulatory Visit (INDEPENDENT_AMBULATORY_CARE_PROVIDER_SITE_OTHER): Payer: BC Managed Care – PPO | Admitting: Physician Assistant

## 2014-01-02 VITALS — BP 122/86 | HR 103 | Temp 98.2°F | Resp 18 | Ht 65.0 in | Wt 259.0 lb

## 2014-01-02 DIAGNOSIS — J3489 Other specified disorders of nose and nasal sinuses: Secondary | ICD-10-CM

## 2014-01-02 DIAGNOSIS — R0981 Nasal congestion: Secondary | ICD-10-CM

## 2014-01-02 MED ORDER — FLUTICASONE PROPIONATE 50 MCG/ACT NA SUSP: 2.0000 | Freq: Every day | NASAL | Status: DC

## 2014-01-02 MED ORDER — CETIRIZINE HCL 10 MG PO TABS: 10.0000 mg | ORAL_TABLET | Freq: Every day | ORAL | Status: DC

## 2014-01-02 MED ORDER — PSEUDOEPHEDRINE HCL 60 MG PO TABS: 60.0000 mg | ORAL_TABLET | ORAL | Status: DC | PRN

## 2014-01-02 NOTE — Patient Instructions (Signed)
I think your symptoms at this point are most likely due to congestion from either a viral infection or allergies. Please take the zyrtec 49m once daily for allergies. Please take the sudafed every 4 hours as needed for congestion. Please use the flonase nasal spray two sprays in each nostril once daily for the congestion. If these measures don't help please contact [facility] a few days and I'll send in an antibiotic at that time. We may refer you to a specialist if your symptoms persist.

## 2014-01-02 NOTE — Progress Notes (Signed)
Subjective:    Patient ID: Katherine Lewis, female    DOB: Jun 08, 1970, 43 y.o.   MRN: [id number]  PCP: Vidal Schwalbe, MD  Chief Complaint  Patient presents with  . Sinus Problem    sxs started past few weeks  . Fever  . Sore Throat  . Adenopathy  . Ear Pain   Patient Active Problem List   Diagnosis Date Noted  . Chronic cough 04/03/2012  . Right knee pain 06/11/2011   Prior to Admission medications   Medication Sig Start Date End Date Taking? Authorizing Provider  buPROPion (WELLBUTRIN XL) 150 MG 24 hr tablet Take 150 mg by mouth daily.   Yes Historical Provider, MD  escitalopram (LEXAPRO) 5 MG tablet Take 5 mg by mouth daily.   Yes Historical Provider, MD  levonorgestrel (MIRENA) 20 MCG/24HR IUD 1 each by Intrauterine route once.   Yes Historical Provider, MD  pantoprazole (PROTONIX) 40 MG tablet TAKE 1 TABLET TWICE DAILY. 04/19/12  Yes Kathee Delton, MD  cetirizine (ZYRTEC) 10 MG tablet Take 1 tablet (10 mg total) by mouth daily. 01/02/14   Leann Mayweather, PA  fluticasone (FLONASE) 50 MCG/ACT nasal spray Place 2 sprays into both nostrils daily. 01/02/14   Araceli Bouche, PA  pseudoephedrine (SUDAFED) 60 MG tablet Take 1 tablet (60 mg total) by mouth every 4 (four) hours as needed for congestion. 01/02/14   Araceli Bouche, PA   Medications, allergies, past medical history, surgical history, family history, social history and problem list reviewed and updated.  HPI  72 yof with no pertinent [place] presents to clinic today with 2 wk h/o ear congestion and head congestion.   She feels as if her entire is swollen. She felt warm at work yest so went home, did not actually check her temp. She feels her congestion has intensified past couple days. Has also had a sore throat past couple days. Been around several sick contacts at work. Denies cough, abd pain, vomiting, diarrhea. No chills.   Has taken alkaseltzer cold and sinus along with doing daily saline nasal flushes.   She has never  been diagnosed with allergies. Denies itchy eyes or h/o seasonal allergies.   Was seen here 2/15 diagnosed with sinusitis given augmentin. Was seen here 7/15 diagnosed with bronchitis given azithro.  She is unsure if the abx have ever really helped her sx and feels that they often give her an upset stomach.   HR slightly elevated today. This is somewhat higher than her normal when she is here.   Review of Systems No CP, SOB.     Objective:   Physical Exam  Constitutional: She is oriented to person, place, and time. She appears well-developed and well-nourished.  Non-toxic appearance. She does not have a sickly appearance. She does not appear ill. No distress.  BP 122/86 mmHg  Pulse 103  Temp(Src) 98.2 F (36.8 C) (Oral)  Resp 18  Ht 5' 5"  (1.651 m)  Wt 259 lb (117.482 kg)  BMI 43.10 kg/m2  SpO2 94%   HENT:  Right Ear: Tympanic membrane is not erythematous and not bulging. A middle ear effusion is present.  Left Ear: Tympanic membrane is not erythematous and not bulging. A middle ear effusion is present.  Nose: Mucosal edema and rhinorrhea present. Right sinus exhibits maxillary sinus tenderness. Right sinus exhibits no frontal sinus tenderness. Left sinus exhibits maxillary sinus tenderness. Left sinus exhibits no frontal sinus tenderness.  Mouth/Throat: Uvula is midline and oropharynx is clear and moist. No  oropharyngeal exudate, posterior oropharyngeal edema, posterior oropharyngeal erythema or tonsillar abscesses.  Moderate TTP over maxillary sinuses bilaterally.   Eyes: Conjunctivae and EOM are normal. Pupils are equal, round, and reactive to light.  Neck: No Brudzinski's sign noted.  Pulmonary/Chest: Effort normal and breath sounds normal. No tachypnea. She has no decreased breath sounds. She has no wheezes. She has no rhonchi. She has no rales.  Lymphadenopathy:       Head (right side): No submental, no submandibular and no tonsillar adenopathy present.       Head (left  side): No submental, no submandibular and no tonsillar adenopathy present.    She has no cervical adenopathy.  Neurological: She is alert and oriented to person, place, and time.  Psychiatric: She has a normal mood and affect. Her speech is normal.      Assessment & Plan:   5 yof with no pertinent [place] presents to clinic today with 2 wk h/o ear congestion and head congestion.   Head congestion - Plan: cetirizine (ZYRTEC) 10 MG tablet, fluticasone (FLONASE) 50 MCG/ACT nasal spray, pseudoephedrine (SUDAFED) 60 MG tablet Nasal congestion - Plan: cetirizine (ZYRTEC) 10 MG tablet, fluticasone (FLONASE) 50 MCG/ACT nasal spray, pseudoephedrine (SUDAFED) 60 MG tablet Sinus pressure - Plan: cetirizine (ZYRTEC) 10 MG tablet, fluticasone (FLONASE) 50 MCG/ACT nasal spray, pseudoephedrine (SUDAFED) 60 MG tablet --doubt bacterial cause at this time, exam benign other than ttp over maxillary sinuses, no fever today --could be secondary to viral uri or allergies with signs of congestion on exam --trial of zyrtec/flonase/sudafed --pt instructed to contact office if sx not resolving in 3 days, will send abx at that time as pt did have sinus ttp today --pt informed that if sx do return even with new regimen she may want to f/u with ent as she has freq sinus infxs  Julieta Gutting, PA-C Physician Assistant-Certified [facility]  01/02/2014 4:28 PM

## 2014-01-03 ENCOUNTER — Telehealth: Payer: Self-pay | Admitting: Physician Assistant

## 2014-01-03 MED ORDER — FLUCONAZOLE 150 MG PO TABS: 150.0000 mg | ORAL_TABLET | Freq: Once | ORAL | Status: DC

## 2014-01-03 MED ORDER — AMOXICILLIN-POT CLAVULANATE 875-125 MG PO TABS: 1.0000 | ORAL_TABLET | Freq: Two times a day (BID) | ORAL | Status: DC

## 2014-01-03 NOTE — Telephone Encounter (Signed)
Pt called again regarding this. Pt is really wanting this done as she is not feeling well. Please advise.

## 2014-01-03 NOTE — Telephone Encounter (Signed)
Call and left detailed VM to give the antibiotic a couple more days; she was just seen yesterday.  Regarding the Diflucan, can we send that in? I have pended the Diflucan rx.  Please advise.

## 2014-01-03 NOTE — Telephone Encounter (Signed)
Spoke with [employer]. He said it was ok to send in Augmentin. Sent along with Diflucan. LMOM letting pt know

## 2014-01-03 NOTE — Telephone Encounter (Signed)
Patient states that she was instructed to call back if not feeling better and an antibiotic could be called in for her. Please note: patient has also requested Diflucan along with the antibiotic due to her having a reaction each time she takes antibiotics. [place]

## 2014-01-04 ENCOUNTER — Ambulatory Visit: Payer: BC Managed Care – PPO

## 2014-01-17 ENCOUNTER — Ambulatory Visit
Admission: RE | Admit: 2014-01-17 | Discharge: 2014-01-17 | Disposition: A | Discharge status: Home / Self Care | Payer: BC Managed Care – PPO | Source: Ambulatory Visit

## 2014-01-17 ENCOUNTER — Other Ambulatory Visit (INDEPENDENT_AMBULATORY_CARE_PROVIDER_SITE_OTHER): Payer: Self-pay | Admitting: Surgery

## 2014-01-17 ENCOUNTER — Other Ambulatory Visit (INDEPENDENT_AMBULATORY_CARE_PROVIDER_SITE_OTHER): Payer: Self-pay

## 2014-01-17 DIAGNOSIS — Z833 Family history of diabetes mellitus: Secondary | ICD-10-CM

## 2014-01-17 DIAGNOSIS — Z1231 Encounter for screening mammogram for malignant neoplasm of breast: Secondary | ICD-10-CM

## 2014-01-17 LAB — COMPREHENSIVE METABOLIC PANEL
ALBUMIN: 4 g/dL (ref 3.5–5.2)
ALT: 17 U/L (ref 0–35)
AST: 14 U/L (ref 0–37)
Alkaline Phosphatase: 62 U/L (ref 39–117)
BUN: 9 mg/dL (ref 6–23)
CALCIUM: 9.2 mg/dL (ref 8.4–10.5)
CHLORIDE: 96 meq/L (ref 96–112)
CO2: 27 mEq/L (ref 19–32)
Creat: 0.76 mg/dL (ref 0.50–1.10)
Glucose, Bld: 79 mg/dL (ref 70–99)
POTASSIUM: 3.6 meq/L (ref 3.5–5.3)
Sodium: 136 mEq/L (ref 135–145)
Total Bilirubin: 0.7 mg/dL (ref 0.2–1.2)
Total Protein: 6.6 g/dL (ref 6.0–8.3)

## 2014-01-17 LAB — CBC WITH DIFFERENTIAL/PLATELET
BASOS PCT: 0 % (ref 0–1)
Basophils Absolute: 0 10*3/uL (ref 0.0–0.1)
EOS ABS: 0.1 10*3/uL (ref 0.0–0.7)
Eosinophils Relative: 1 % (ref 0–5)
HEMATOCRIT: 40.4 % (ref 36.0–46.0)
Hemoglobin: 14.1 g/dL (ref 12.0–15.0)
Lymphocytes Relative: 26 % (ref 12–46)
Lymphs Abs: 2.3 10*3/uL (ref 0.7–4.0)
MCH: 30.6 pg (ref 26.0–34.0)
MCHC: 34.9 g/dL (ref 30.0–36.0)
MCV: 87.6 fL (ref 78.0–100.0)
MONOS PCT: 5 % (ref 3–12)
MPV: 9 fL (ref 8.6–12.4)
Monocytes Absolute: 0.4 10*3/uL (ref 0.1–1.0)
Neutro Abs: 6.1 10*3/uL (ref 1.7–7.7)
Neutrophils Relative %: 68 % (ref 43–77)
PLATELETS: 273 10*3/uL (ref 150–400)
RBC: 4.61 MIL/uL (ref 3.87–5.11)
RDW: 13.7 % (ref 11.5–15.5)
WBC: 8.9 10*3/uL (ref 4.0–10.5)

## 2014-01-17 LAB — IRON AND TIBC
%SAT: 31 % (ref 20–55)
IRON: 122 ug/dL (ref 42–145)
TIBC: 397 ug/dL (ref 250–470)
UIBC: 275 ug/dL (ref 125–400)

## 2014-01-17 LAB — URINALYSIS
Bilirubin Urine: NEGATIVE
Glucose, UA: NEGATIVE mg/dL
Hgb urine dipstick: NEGATIVE
KETONES UR: NEGATIVE mg/dL
Leukocytes, UA: NEGATIVE
NITRITE: NEGATIVE
Protein, ur: NEGATIVE mg/dL
SPECIFIC GRAVITY, URINE: 1.015 (ref 1.005–1.030)
UROBILINOGEN UA: 0.2 mg/dL (ref 0.0–1.0)
pH: 6 (ref 5.0–8.0)

## 2014-01-17 LAB — T4: T4, Total: 7.9 ug/dL (ref 4.5–12.0)

## 2014-01-17 LAB — HM MAMMOGRAPHY

## 2014-01-18 LAB — FOLATE: Folate: 13.6 ng/mL

## 2014-01-18 LAB — PROTIME-INR
INR: 0.97 (ref <1.50)
PROTHROMBIN TIME: 12.9 s (ref 11.6–15.2)

## 2014-01-18 LAB — TSH: TSH: 10.817 u[IU]/mL — ABNORMAL HIGH (ref 0.350–4.500)

## 2014-01-18 LAB — VITAMIN B12: Vitamin B-12: 529 pg/mL (ref 211–911)

## 2014-01-18 LAB — HEMOGLOBIN A1C
Hgb A1c MFr Bld: 5.2 % (ref <5.7)
MEAN PLASMA GLUCOSE: 103 mg/dL (ref <117)

## 2014-01-20 LAB — VITAMIN D 1,25 DIHYDROXY
VITAMIN D 1, 25 (OH) TOTAL: 75 pg/mL — AB (ref 18–72)
VITAMIN D2 1, 25 (OH): 40 pg/mL
VITAMIN D3 1, 25 (OH): 35 pg/mL

## 2014-01-21 LAB — H. PYLORI ANTIBODY, IGG: H Pylori IgG: 0.43 {ISR}

## 2014-01-26 ENCOUNTER — Encounter: Payer: BC Managed Care – PPO | Attending: Surgery | Admitting: Dietician

## 2014-01-26 DIAGNOSIS — Z713 Dietary counseling and surveillance: Secondary | ICD-10-CM | POA: Diagnosis not present

## 2014-01-26 DIAGNOSIS — Z6841 Body Mass Index (BMI) 40.0 and over, adult: Secondary | ICD-10-CM | POA: Diagnosis not present

## 2014-01-26 NOTE — Patient Instructions (Signed)
Follow Pre-Op Goals Try Protein Shakes Call [facility] at [phone] when surgery is scheduled to enroll in Pre-Op Class  Things to remember:  Please always be honest with [place]. We want to support you!  If you have any questions or concerns in between appointments, please call or email Ferol Luz, or Margarita Grizzle.  The diet after surgery will be high protein and low in carbohydrate.  Vitamins and calcium need to be taken for the rest of your life.  Feel free to include support people in any classes or appointments.

## 2014-01-26 NOTE — Progress Notes (Signed)
  Pre-Op Assessment Visit:  Pre-Operative Gastric sleeve Surgery  Medical Nutrition Therapy:  Appt start time: 1400   End time:  6568.  Patient was seen on 01/26/2014 for Pre-Operative Nutrition Assessment. Assessment and letter of approval faxed to [facility] Bariatric Surgery Program coordinator on 01/26/2014.   Preferred Learning Style:   No preference indicated   Learning Readiness:   Ready  Handouts given during visit include:  Pre-Op Goals Bariatric Surgery Protein Shakes   During the appointment today the following Pre-Op Goals were reviewed with the patient: Maintain or lose weight as instructed by your surgeon Make healthy food choices Begin to limit portion sizes Limited concentrated sugars and fried foods Keep fat/sugar in the single digits per serving on   food labels Practice CHEWING your food  (aim for 30 chews per bite or until applesauce consistency) Practice not drinking 15 minutes before, during, and 30 minutes after each meal/snack Avoid all carbonated beverages  Avoid/limit caffeinated beverages  Avoid all sugar-sweetened beverages Consume 3 meals per day; eat every 3-5 hours Make a list of non-food related activities Aim for 64-100 ounces of FLUID daily  Aim for at least 60-80 grams of PROTEIN daily Look for a liquid protein source that contain ?15 g protein and ?5 g carbohydrate  (ex: shakes, drinks, shots)  Patient-Centered Goals: Increased flexibility, comfort with exercise/activity, diabetes prevention  Specific/non-scale and confidence (9) / importance scale (10)  Demonstrated degree of understanding via:  Teach Back  Teaching Method Utilized:  Visual Auditory Hands on  Barriers to learning/adherence to lifestyle change: none  [facility] to [facility] to enroll in Pre-Op and Post-Op Nutrition Education when surgery date is scheduled.

## 2014-02-07 ENCOUNTER — Ambulatory Visit: Payer: BC Managed Care – PPO | Admitting: Dietician

## 2014-02-20 ENCOUNTER — Other Ambulatory Visit (HOSPITAL_COMMUNITY): Payer: BC Managed Care – PPO

## 2014-02-20 ENCOUNTER — Ambulatory Visit (HOSPITAL_COMMUNITY): Payer: BC Managed Care – PPO

## 2014-02-21 ENCOUNTER — Ambulatory Visit (HOSPITAL_COMMUNITY)
Admission: RE | Admit: 2014-02-21 | Discharge: 2014-02-21 | Disposition: A | Discharge status: Home / Self Care | Payer: BC Managed Care – PPO | Source: Ambulatory Visit | Attending: Surgery | Admitting: Surgery

## 2014-02-21 ENCOUNTER — Other Ambulatory Visit: Payer: Self-pay

## 2014-02-21 DIAGNOSIS — Z01818 Encounter for other preprocedural examination: Secondary | ICD-10-CM | POA: Insufficient documentation

## 2014-02-21 DIAGNOSIS — Z833 Family history of diabetes mellitus: Secondary | ICD-10-CM | POA: Diagnosis not present

## 2014-04-01 ENCOUNTER — Encounter: Payer: BC Managed Care – PPO | Attending: Surgery

## 2014-04-01 DIAGNOSIS — Z713 Dietary counseling and surveillance: Secondary | ICD-10-CM | POA: Diagnosis not present

## 2014-04-01 DIAGNOSIS — Z6841 Body Mass Index (BMI) 40.0 and over, adult: Secondary | ICD-10-CM | POA: Insufficient documentation

## 2014-04-02 NOTE — Progress Notes (Signed)
  Pre-Operative Nutrition Class:  Appt start time: 1155   End time:  1830.  Patient was seen on 04/01/2014 for Pre-Operative Bariatric Surgery Education at the [facility].   Surgery date:  Surgery type: gastric sleeve Start weight at [facility]: 261 lbs on 01/26/14 Weight today: 267 lbs  TANITA  BODY COMP RESULTS  04/01/14   BMI (kg/m^2) 44.4   Fat Mass (lbs) 127   Fat Free Mass (lbs) 140   Total Body Water (lbs) 102.5   Samples given per MNT protocol. Patient educated on appropriate usage: Premier protein shake (strawberry - qty 1) Lot #: [id number] Exp: 09/2014  Unjury protein powder (unflavored - qty 1) Lot #: [id number] Exp: 02/2015  Bariatric Advantage Calcium citrate chews (orange - qty 1) Lot #: [id number] Exp: 05/2014  PB2 (qty 1) Lot #: [id number] Exp: 08/2014   The following the learning objectives were met by the patient during this course:  Identify Pre-Op Dietary Goals and will begin 2 weeks pre-operatively  Identify appropriate sources of fluids and proteins   State protein recommendations and appropriate sources pre and post-operatively  Identify Post-Operative Dietary Goals and will follow for 2 weeks post-operatively  Identify appropriate multivitamin and calcium sources  Describe the need for physical activity post-operatively and will follow MD recommendations  State when to call healthcare provider regarding medication questions or post-operative complications  Handouts given during class include:  Pre-Op Bariatric Surgery Diet Handout  Protein Shake Handout  Post-Op Bariatric Surgery Nutrition Handout  [facility] Program Information Flyer  Support Group Information Flyer  [facility] Bariatric Supplements Price List  Follow-Up Plan: Patient will follow-up at [facility] 2 weeks post operatively for diet advancement per MD.

## 2014-04-10 ENCOUNTER — Ambulatory Visit: Payer: Self-pay | Admitting: Surgery

## 2014-04-10 NOTE — H&P (Signed)
Chief Complaint:  For sleeve gastectomy on April 5th  History of Present Illness:  Katherine Lewis is an 44 y.o. female who was seen by me on New Year's Eve in consultation for bariatric surgery.  He had been to warmer seminars and we had discussed all 3 options.  She does not have diabetes per the mother has type 2 diabetes.  We talked about a gastric bypass but in the meantime she's done a lot of research and wanted to have a sleeve gastrectomy.  She does have some gastroesophageal reflux disease but her upper GI series was negative for significant hiatal hernia. She's never had DVT. She is prepped minimally for sleeve gastrectomy.  Today's weight was 264.4 with a BMI of 44.  Plan to proceed with sleeve gastrectomy.  Past Medical History  Diagnosis Date  . Depression   . Insomnia   . Plantar fasciitis   . Vitamin D deficiency   . Sleep apnea     Past Surgical History  Procedure Laterality Date  . No past surgeries      Current Outpatient Prescriptions  Medication Sig Dispense Refill  . amoxicillin-clavulanate (AUGMENTIN) 875-125 MG per tablet Take 1 tablet by mouth 2 (two) times daily. 14 tablet 0  . buPROPion (WELLBUTRIN XL) 150 MG 24 hr tablet Take 150 mg by mouth daily.    . cetirizine (ZYRTEC) 10 MG tablet Take 1 tablet (10 mg total) by mouth daily. 30 tablet 11  . escitalopram (LEXAPRO) 5 MG tablet Take 5 mg by mouth daily.    . fluconazole (DIFLUCAN) 150 MG tablet Take 1 tablet (150 mg total) by mouth once. Repeat if needed 2 tablet 0  . fluticasone (FLONASE) 50 MCG/ACT nasal spray Place 2 sprays into both nostrils daily. 16 g 12  . levonorgestrel (MIRENA) 20 MCG/24HR IUD 1 each by Intrauterine route once.    . pantoprazole (PROTONIX) 40 MG tablet TAKE 1 TABLET TWICE DAILY. 30 tablet 0  . pseudoephedrine (SUDAFED) 60 MG tablet Take 1 tablet (60 mg total) by mouth every 4 (four) hours as needed for congestion. 30 tablet 0   No current facility-administered medications for this  visit.   Codeine Family History  Problem Relation Age of Onset  . Lung cancer Paternal Grandmother   . Lung cancer Paternal Grandfather   . Diabetes type II Mother   . Diabetes Mother    Social History:   reports that she quit smoking about 22 years ago. Her smoking use included Cigarettes. She has a 3 pack-year smoking history. She has never used smokeless tobacco. She reports that she drinks alcohol. She reports that she does not use illicit drugs.   REVIEW OF SYSTEMS : Negative except for negative history of DVT.  Physical Exam:   There were no vitals taken for this visit. There is no weight on file to calculate BMI.  Gen:  WDWN white female NAD  Neurological: Alert and oriented to person, place, and time. Motor and sensory function is grossly intact  Head: Normocephalic and atraumatic.  Eyes: Conjunctivae are normal. Pupils are equal, round, and reactive to light. No scleral icterus.  Neck: Normal range of motion. Neck supple. No tracheal deviation or thyromegaly present.  Cardiovascular:  SR without murmurs or gallops.  No carotid bruits Breast:  Not examined Respiratory: Effort normal.  No respiratory distress. No chest wall tenderness. Breath sounds normal.  No wheezes, rales or rhonchi.  Abdomen:  Obese nontender GU:  Not examined Musculoskeletal: Normal range of motion.  Extremities are nontender. No cyanosis, edema or clubbing noted Lymphadenopathy: No cervical, preauricular, postauricular or axillary adenopathy is present Skin: Skin is warm and dry. No rash noted. No diaphoresis. No erythema. No pallor. Pscyh: Normal mood and affect. Behavior is normal. Judgment and thought content normal.   LABORATORY RESULTS: No results found for this or any previous visit (from the past 48 hour(s)).   RADIOLOGY RESULTS:  Upper GI was negative No results found.  Problem List: Patient Active Problem List   Diagnosis Date Noted  . Chronic cough 04/03/2012  . Right knee pain  06/11/2011    Assessment & Plan: Morbid obesity BMI 61fr laparoscopic sleeve gastrectomy.    Matt B. MHassell Done MD, [facility] [phone] [phone] 04/10/2014 1:36 PM

## 2014-04-11 NOTE — Patient Instructions (Addendum)
Katherine Lewis  04/11/2014   Your procedure is scheduled on: 04/23/2014    Report to [facility] Main  Entrance and follow signs to               [place] at     [place] AM.  Call this number if you have problems the morning of surgery [phone]   Remember:  Do not eat food or drink liquids :After Midnight.     Take these medicines the morning of surgery with A SIP OF WATER:  Wellbutrin, Lexapro, Protonix                                You may not have any metal on your body including hair pins and              piercings  Do not wear jewelry, make-up, lotions, powders or perfumes., deodorant.              Do not wear nail polish.  Do not shave  48 hours prior to surgery.                Do not bring valuables to the hospital. [place].  Contacts, dentures or bridgework may not be worn into surgery.  Leave suitcase in the car. After surgery it may be brought to your room.     Marland Kitchen  Special Instructions: coughing and deep breathing exercises, leg exercises               Please read over the following fact sheets you were given: _____________________________________________________________________             [facility] - Preparing for Surgery Before surgery, you can play an important role.  Because skin is not sterile, your skin needs to be as free of germs as possible.  You can reduce the number of germs on your skin by washing with CHG (chlorahexidine gluconate) soap before surgery.  CHG is an antiseptic cleaner which kills germs and bonds with the skin to continue killing germs even after washing. Please DO NOT use if you have an allergy to CHG or antibacterial soaps.  If your skin becomes reddened/irritated stop using the CHG and inform your nurse when you arrive at Short Stay. Do not shave (including legs and underarms) for at least 48 hours prior to the first CHG shower.  You may shave your  face/neck. Please follow these instructions carefully:  1.  Shower with CHG Soap the night before surgery and the  morning of Surgery.  2.  If you choose to wash your hair, wash your hair first as usual with your  normal  shampoo.  3.  After you shampoo, rinse your hair and body thoroughly to remove the  shampoo.                           4.  Use CHG as you would any other liquid soap.  You can apply chg directly  to the skin and wash                       Gently with a scrungie or clean washcloth.  5.  Apply the CHG Soap to your body ONLY FROM THE NECK DOWN.   Do not use on face/ open                           Wound or open sores. Avoid contact with eyes, ears mouth and genitals (private parts).                       Wash face,  Genitals (private parts) with your normal soap.             6.  Wash thoroughly, paying special attention to the area where your surgery  will be performed.  7.  Thoroughly rinse your body with warm water from the neck down.  8.  DO NOT shower/wash with your normal soap after using and rinsing off  the CHG Soap.                9.  Pat yourself dry with a clean towel.            10.  Wear clean pajamas.            11.  Place clean sheets on your bed the night of your first shower and do not  sleep with pets. Day of Surgery : Do not apply any lotions/deodorants the morning of surgery.  Please wear clean clothes to the hospital/surgery center.  FAILURE TO FOLLOW THESE INSTRUCTIONS MAY RESULT IN THE CANCELLATION OF YOUR SURGERY PATIENT SIGNATURE_________________________________  NURSE SIGNATURE__________________________________  ________________________________________________________________________

## 2014-04-18 ENCOUNTER — Encounter (HOSPITAL_COMMUNITY): Payer: Self-pay

## 2014-04-18 ENCOUNTER — Encounter (HOSPITAL_COMMUNITY)
Admission: RE | Admit: 2014-04-18 | Discharge: 2014-04-18 | Disposition: A | Discharge status: Home / Self Care | Payer: BC Managed Care – PPO | Source: Ambulatory Visit | Attending: Surgery | Admitting: Surgery

## 2014-04-18 DIAGNOSIS — Z01812 Encounter for preprocedural laboratory examination: Secondary | ICD-10-CM | POA: Insufficient documentation

## 2014-04-18 HISTORY — DX: Anxiety disorder, unspecified: F41.9

## 2014-04-18 HISTORY — DX: Gastro-esophageal reflux disease without esophagitis: K21.9

## 2014-04-18 HISTORY — DX: Essential (primary) hypertension: I10

## 2014-04-18 HISTORY — DX: Hypothyroidism, unspecified: E03.9

## 2014-04-18 LAB — COMPREHENSIVE METABOLIC PANEL
ALT: 37 U/L — AB (ref 0–35)
AST: 34 U/L (ref 0–37)
Albumin: 4.7 g/dL (ref 3.5–5.2)
Alkaline Phosphatase: 64 U/L (ref 39–117)
Anion gap: 9 (ref 5–15)
BUN: 14 mg/dL (ref 6–23)
CO2: 30 mmol/L (ref 19–32)
Calcium: 10 mg/dL (ref 8.4–10.5)
Chloride: 99 mmol/L (ref 96–112)
Creatinine, Ser: 0.81 mg/dL (ref 0.50–1.10)
GFR calc non Af Amer: 88 mL/min — ABNORMAL LOW (ref >90)
GLUCOSE: 104 mg/dL — AB (ref 70–99)
Potassium: 3.4 mmol/L — ABNORMAL LOW (ref 3.5–5.1)
SODIUM: 138 mmol/L (ref 135–145)
TOTAL PROTEIN: 7.7 g/dL (ref 6.0–8.3)
Total Bilirubin: 0.5 mg/dL (ref 0.3–1.2)

## 2014-04-18 LAB — CBC WITH DIFFERENTIAL/PLATELET
Basophils Absolute: 0 10*3/uL (ref 0.0–0.1)
Basophils Relative: 0 % (ref 0–1)
EOS ABS: 0.2 10*3/uL (ref 0.0–0.7)
Eosinophils Relative: 2 % (ref 0–5)
HEMATOCRIT: 41.2 % (ref 36.0–46.0)
HEMOGLOBIN: 14.2 g/dL (ref 12.0–15.0)
Lymphocytes Relative: 30 % (ref 12–46)
Lymphs Abs: 2.8 10*3/uL (ref 0.7–4.0)
MCH: 31.1 pg (ref 26.0–34.0)
MCHC: 34.5 g/dL (ref 30.0–36.0)
MCV: 90.4 fL (ref 78.0–100.0)
MONO ABS: 0.6 10*3/uL (ref 0.1–1.0)
MONOS PCT: 6 % (ref 3–12)
Neutro Abs: 5.7 10*3/uL (ref 1.7–7.7)
Neutrophils Relative %: 62 % (ref 43–77)
Platelets: 254 10*3/uL (ref 150–400)
RBC: 4.56 MIL/uL (ref 3.87–5.11)
RDW: 13.6 % (ref 11.5–15.5)
WBC: 9.2 10*3/uL (ref 4.0–10.5)

## 2014-04-18 NOTE — Progress Notes (Signed)
AT time of preop appointment patient stated she had already been given her postop pain medication prescription for surgeon.  Patient concerned due to codeine allergy that the [facility] prescription which she had with her and showed to nurse had codeine in prescription.  Looked on Internet x 2 and informed patient that [facility] had hydrocodone and tylenol in the prescription but instructed her to check with pharmacist when she gets it filled to double check and make sure there is no codeine in the presciption.  Patient voiced understanding.

## 2014-04-18 NOTE — Progress Notes (Signed)
EGK doen 02/21/2014 in EPIC .

## 2014-04-22 ENCOUNTER — Ambulatory Visit: Payer: Self-pay | Admitting: Surgery

## 2014-04-23 ENCOUNTER — Inpatient Hospital Stay (HOSPITAL_COMMUNITY): Payer: BC Managed Care – PPO | Admitting: Certified Registered Nurse Anesthetist

## 2014-04-23 ENCOUNTER — Inpatient Hospital Stay (HOSPITAL_COMMUNITY)
Admission: RE | Admit: 2014-04-23 | Discharge: 2014-04-25 | DRG: 621 | Disposition: A | Discharge status: Home / Self Care | Payer: BC Managed Care – PPO | Source: Ambulatory Visit | Attending: Surgery | Admitting: Surgery

## 2014-04-23 ENCOUNTER — Encounter (HOSPITAL_COMMUNITY): Payer: Self-pay | Admitting: *Deleted

## 2014-04-23 ENCOUNTER — Encounter (HOSPITAL_COMMUNITY)
Admission: RE | Disposition: A | Discharge status: Home / Self Care | Payer: Self-pay | Source: Ambulatory Visit | Attending: Surgery

## 2014-04-23 DIAGNOSIS — Z6841 Body Mass Index (BMI) 40.0 and over, adult: Secondary | ICD-10-CM

## 2014-04-23 DIAGNOSIS — G4730 Sleep apnea, unspecified: Secondary | ICD-10-CM | POA: Diagnosis present

## 2014-04-23 DIAGNOSIS — Z87891 Personal history of nicotine dependence: Secondary | ICD-10-CM | POA: Diagnosis not present

## 2014-04-23 DIAGNOSIS — K219 Gastro-esophageal reflux disease without esophagitis: Secondary | ICD-10-CM | POA: Diagnosis present

## 2014-04-23 DIAGNOSIS — Z79899 Other long term (current) drug therapy: Secondary | ICD-10-CM | POA: Diagnosis not present

## 2014-04-23 DIAGNOSIS — Z01812 Encounter for preprocedural laboratory examination: Secondary | ICD-10-CM

## 2014-04-23 DIAGNOSIS — Z833 Family history of diabetes mellitus: Secondary | ICD-10-CM

## 2014-04-23 DIAGNOSIS — Z801 Family history of malignant neoplasm of trachea, bronchus and lung: Secondary | ICD-10-CM | POA: Diagnosis not present

## 2014-04-23 DIAGNOSIS — Z9884 Bariatric surgery status: Secondary | ICD-10-CM

## 2014-04-23 HISTORY — PX: LAPAROSCOPIC GASTRIC SLEEVE RESECTION: SHX5895

## 2014-04-23 LAB — CBC
HCT: 37.9 % (ref 36.0–46.0)
Hemoglobin: 13 g/dL (ref 12.0–15.0)
MCH: 31.5 pg (ref 26.0–34.0)
MCHC: 34.3 g/dL (ref 30.0–36.0)
MCV: 91.8 fL (ref 78.0–100.0)
Platelets: 201 10*3/uL (ref 150–400)
RBC: 4.13 MIL/uL (ref 3.87–5.11)
RDW: 13.8 % (ref 11.5–15.5)
WBC: 6.7 10*3/uL (ref 4.0–10.5)

## 2014-04-23 LAB — CREATININE, SERUM
CREATININE: 1.25 mg/dL — AB (ref 0.50–1.10)
GFR calc Af Amer: 60 mL/min — ABNORMAL LOW (ref >90)
GFR, EST NON AFRICAN AMERICAN: 52 mL/min — AB (ref >90)

## 2014-04-23 LAB — GLUCOSE, CAPILLARY: Glucose-Capillary: 100 mg/dL — ABNORMAL HIGH (ref 70–99)

## 2014-04-23 LAB — PREGNANCY, URINE: Preg Test, Ur: NEGATIVE

## 2014-04-23 SURGERY — GASTRECTOMY, SLEEVE, LAPAROSCOPIC
Anesthesia: General | Site: Abdomen

## 2014-04-23 MED ORDER — PROPOFOL 10 MG/ML IV BOLUS
INTRAVENOUS | Status: DC | PRN
  Administered 2014-04-23: 160 mg via INTRAVENOUS
  Administered 2014-04-23: 40 mg via INTRAVENOUS

## 2014-04-23 MED ORDER — LABETALOL HCL 5 MG/ML IV SOLN
INTRAVENOUS | Status: AC
  Filled 2014-04-23: qty 4

## 2014-04-23 MED ORDER — GLYCOPYRROLATE 0.2 MG/ML IJ SOLN
INTRAMUSCULAR | Status: AC
  Filled 2014-04-23: qty 1

## 2014-04-23 MED ORDER — NEOSTIGMINE METHYLSULFATE 10 MG/10ML IV SOLN
INTRAVENOUS | Status: AC
  Filled 2014-04-23: qty 1

## 2014-04-23 MED ORDER — CISATRACURIUM BESYLATE (PF) 10 MG/5ML IV SOLN
INTRAVENOUS | Status: DC | PRN
  Administered 2014-04-23: 10 mg via INTRAVENOUS
  Administered 2014-04-23: 4 mg via INTRAVENOUS

## 2014-04-23 MED ORDER — SUCCINYLCHOLINE CHLORIDE 20 MG/ML IJ SOLN
INTRAMUSCULAR | Status: DC | PRN
  Administered 2014-04-23: 100 mg via INTRAVENOUS

## 2014-04-23 MED ORDER — ONDANSETRON HCL 4 MG/2ML IJ SOLN
INTRAMUSCULAR | Status: AC
  Filled 2014-04-23: qty 2

## 2014-04-23 MED ORDER — LACTATED RINGERS IV SOLN
INTRAVENOUS | Status: DC | PRN
  Administered 2014-04-23 (×2): via INTRAVENOUS

## 2014-04-23 MED ORDER — SODIUM CHLORIDE 0.9 % IJ SOLN
INTRAMUSCULAR | Status: AC
  Filled 2014-04-23: qty 10

## 2014-04-23 MED ORDER — LABETALOL HCL 5 MG/ML IV SOLN
INTRAVENOUS | Status: DC | PRN
  Administered 2014-04-23 (×2): 5 mg via INTRAVENOUS

## 2014-04-23 MED ORDER — DEXTROSE 5 % IV SOLN
INTRAVENOUS | Status: AC
  Filled 2014-04-23: qty 2

## 2014-04-23 MED ORDER — GLYCOPYRROLATE 0.2 MG/ML IJ SOLN
INTRAMUSCULAR | Status: DC | PRN
  Administered 2014-04-23: 0.4 mg via INTRAVENOUS

## 2014-04-23 MED ORDER — CISATRACURIUM BESYLATE 20 MG/10ML IV SOLN
INTRAVENOUS | Status: AC
  Filled 2014-04-23: qty 10

## 2014-04-23 MED ORDER — GLYCOPYRROLATE 0.2 MG/ML IJ SOLN
INTRAMUSCULAR | Status: AC
  Filled 2014-04-23: qty 2

## 2014-04-23 MED ORDER — CHLORHEXIDINE GLUCONATE 0.12 % MT SOLN
15.0000 mL | Freq: Two times a day (BID) | OROMUCOSAL | Status: DC
  Administered 2014-04-23 – 2014-04-25 (×5): 15 mL via OROMUCOSAL
  Filled 2014-04-23 (×7): qty 15

## 2014-04-23 MED ORDER — HYDROMORPHONE HCL 2 MG/ML IJ SOLN
INTRAMUSCULAR | Status: AC
  Filled 2014-04-23: qty 1

## 2014-04-23 MED ORDER — ONDANSETRON HCL 4 MG/2ML IJ SOLN
4.0000 mg | INTRAMUSCULAR | Status: DC | PRN
  Administered 2014-04-24: 4 mg via INTRAVENOUS
  Filled 2014-04-23 (×2): qty 2

## 2014-04-23 MED ORDER — LIDOCAINE HCL (CARDIAC) 20 MG/ML IV SOLN
INTRAVENOUS | Status: AC
  Filled 2014-04-23: qty 5

## 2014-04-23 MED ORDER — KCL IN DEXTROSE-NACL 20-5-0.45 MEQ/L-%-% IV SOLN
INTRAVENOUS | Status: DC
  Administered 2014-04-23 – 2014-04-24 (×3): 100 mL/h via INTRAVENOUS
  Filled 2014-04-23 (×7): qty 1000

## 2014-04-23 MED ORDER — HYDROMORPHONE HCL 1 MG/ML IJ SOLN: 0.2500 mg | INTRAMUSCULAR | Status: DC | PRN

## 2014-04-23 MED ORDER — CHLORHEXIDINE GLUCONATE CLOTH 2 % EX PADS: 6.0000 | MEDICATED_PAD | Freq: Once | CUTANEOUS | Status: DC

## 2014-04-23 MED ORDER — ONDANSETRON HCL 4 MG/2ML IJ SOLN
INTRAMUSCULAR | Status: DC | PRN
  Administered 2014-04-23: 4 mg via INTRAVENOUS

## 2014-04-23 MED ORDER — DEXTROSE 5 % IV SOLN
2.0000 g | INTRAVENOUS | Status: AC
  Administered 2014-04-23 (×2): 2 g via INTRAVENOUS

## 2014-04-23 MED ORDER — BUPIVACAINE LIPOSOME 1.3 % IJ SUSP
20.0000 mL | Freq: Once | INTRAMUSCULAR | Status: AC
  Administered 2014-04-23: 20 mL
  Filled 2014-04-23: qty 20

## 2014-04-23 MED ORDER — UNJURY VANILLA POWDER: 2.0000 [oz_av] | Freq: Four times a day (QID) | ORAL | Status: DC

## 2014-04-23 MED ORDER — MIDAZOLAM HCL 2 MG/2ML IJ SOLN
INTRAMUSCULAR | Status: AC
  Filled 2014-04-23: qty 2

## 2014-04-23 MED ORDER — HEPARIN SODIUM (PORCINE) 5000 UNIT/ML IJ SOLN
5000.0000 [IU] | INTRAMUSCULAR | Status: AC
  Administered 2014-04-23: 5000 [IU] via SUBCUTANEOUS
  Filled 2014-04-23: qty 1

## 2014-04-23 MED ORDER — FENTANYL CITRATE 0.05 MG/ML IJ SOLN
INTRAMUSCULAR | Status: AC
  Filled 2014-04-23: qty 5

## 2014-04-23 MED ORDER — MEPERIDINE HCL 50 MG/ML IJ SOLN: 6.2500 mg | INTRAMUSCULAR | Status: DC | PRN

## 2014-04-23 MED ORDER — FENTANYL CITRATE 0.05 MG/ML IJ SOLN
INTRAMUSCULAR | Status: AC
  Filled 2014-04-23: qty 2

## 2014-04-23 MED ORDER — UNJURY CHOCOLATE CLASSIC POWDER
2.0000 [oz_av] | Freq: Four times a day (QID) | ORAL | Status: DC
  Administered 2014-04-25: 2 [oz_av] via ORAL

## 2014-04-23 MED ORDER — LIDOCAINE HCL 1 % IJ SOLN
INTRAMUSCULAR | Status: DC | PRN
  Administered 2014-04-23: 100 mg via INTRADERMAL

## 2014-04-23 MED ORDER — MORPHINE SULFATE 2 MG/ML IJ SOLN
2.0000 mg | INTRAMUSCULAR | Status: DC | PRN
  Administered 2014-04-23: 2 mg via INTRAVENOUS
  Administered 2014-04-23 – 2014-04-24 (×2): 4 mg via INTRAVENOUS
  Administered 2014-04-24: 2 mg via INTRAVENOUS
  Filled 2014-04-23: qty 2
  Filled 2014-04-23: qty 1
  Filled 2014-04-23: qty 2
  Filled 2014-04-23: qty 1

## 2014-04-23 MED ORDER — CETYLPYRIDINIUM CHLORIDE 0.05 % MT LIQD
7.0000 mL | Freq: Two times a day (BID) | OROMUCOSAL | Status: DC
  Administered 2014-04-23 – 2014-04-24 (×4): 7 mL via OROMUCOSAL

## 2014-04-23 MED ORDER — FENTANYL CITRATE 0.05 MG/ML IJ SOLN
INTRAMUSCULAR | Status: DC | PRN
  Administered 2014-04-23: 50 ug via INTRAVENOUS
  Administered 2014-04-23 (×2): 100 ug via INTRAVENOUS

## 2014-04-23 MED ORDER — HYDROMORPHONE HCL 1 MG/ML IJ SOLN
INTRAMUSCULAR | Status: DC | PRN
  Administered 2014-04-23 (×2): 1 mg via INTRAVENOUS

## 2014-04-23 MED ORDER — 0.9 % SODIUM CHLORIDE (POUR BTL) OPTIME
TOPICAL | Status: DC | PRN
  Administered 2014-04-23: 1000 mL

## 2014-04-23 MED ORDER — UNJURY CHICKEN SOUP POWDER: 2.0000 [oz_av] | Freq: Four times a day (QID) | ORAL | Status: DC

## 2014-04-23 MED ORDER — OXYCODONE HCL 5 MG/5ML PO SOLN
5.0000 mg | ORAL | Status: DC | PRN
  Administered 2014-04-24 – 2014-04-25 (×3): 10 mg via ORAL
  Filled 2014-04-23 (×3): qty 10

## 2014-04-23 MED ORDER — ACETAMINOPHEN 160 MG/5ML PO SOLN: 325.0000 mg | ORAL | Status: DC | PRN

## 2014-04-23 MED ORDER — LACTATED RINGERS IR SOLN
Status: DC | PRN
  Administered 2014-04-23: 1000 mL

## 2014-04-23 MED ORDER — ONDANSETRON HCL 4 MG/2ML IJ SOLN: 4.0000 mg | Freq: Once | INTRAMUSCULAR | Status: DC | PRN

## 2014-04-23 MED ORDER — PROPOFOL 10 MG/ML IV BOLUS
INTRAVENOUS | Status: AC
  Filled 2014-04-23: qty 20

## 2014-04-23 MED ORDER — ACETAMINOPHEN 160 MG/5ML PO SOLN
650.0000 mg | ORAL | Status: DC | PRN
  Administered 2014-04-25: 650 mg via ORAL
  Filled 2014-04-23: qty 20.3

## 2014-04-23 MED ORDER — NEOSTIGMINE METHYLSULFATE 10 MG/10ML IV SOLN
INTRAVENOUS | Status: DC | PRN
  Administered 2014-04-23: 3 mg via INTRAVENOUS

## 2014-04-23 MED ORDER — HEPARIN SODIUM (PORCINE) 5000 UNIT/ML IJ SOLN
5000.0000 [IU] | Freq: Three times a day (TID) | INTRAMUSCULAR | Status: DC
  Administered 2014-04-23 – 2014-04-25 (×5): 5000 [IU] via SUBCUTANEOUS
  Filled 2014-04-23 (×8): qty 1

## 2014-04-23 MED ORDER — MIDAZOLAM HCL 5 MG/5ML IJ SOLN
INTRAMUSCULAR | Status: DC | PRN
  Administered 2014-04-23 (×2): 1 mg via INTRAVENOUS

## 2014-04-23 SURGICAL SUPPLY — 67 items
APL SRG 32X5 SNPLK LF DISP (MISCELLANEOUS)
APPLICATOR COTTON TIP 6IN STRL (MISCELLANEOUS) IMPLANT
APPLIER CLIP 5 13 M/L LIGAMAX5 (MISCELLANEOUS)
APPLIER CLIP ROT 10 11.4 M/L (STAPLE)
APPLIER CLIP ROT 13.4 12 LRG (CLIP)
APR CLP LRG 13.4X12 ROT 20 MLT (CLIP)
APR CLP MED LRG 11.4X10 (STAPLE)
APR CLP MED LRG 5 ANG JAW (MISCELLANEOUS)
BLADE SURG 15 STRL LF DISP TIS (BLADE) ×1 IMPLANT
BLADE SURG 15 STRL SS (BLADE) ×3
CABLE HIGH FREQUENCY MONO STRZ (ELECTRODE) ×2 IMPLANT
CLIP APPLIE 5 13 M/L LIGAMAX5 (MISCELLANEOUS) IMPLANT
CLIP APPLIE ROT 10 11.4 M/L (STAPLE) IMPLANT
CLIP APPLIE ROT 13.4 12 LRG (CLIP) IMPLANT
DEVICE SUT QUICK LOAD TK 5 (STAPLE) IMPLANT
DEVICE SUT TI-KNOT TK 5X26 (MISCELLANEOUS) IMPLANT
DEVICE SUTURE ENDOST 10MM (ENDOMECHANICALS) IMPLANT
DEVICE TI KNOT TK5 (MISCELLANEOUS)
DEVICE TROCAR PUNCTURE CLOSURE (ENDOMECHANICALS) ×3 IMPLANT
DISSECTOR BLUNT TIP ENDO 5MM (MISCELLANEOUS) ×3 IMPLANT
DRAPE CAMERA CLOSED 9X96 (DRAPES) ×3 IMPLANT
ELECT REM PT RETURN 9FT ADLT (ELECTROSURGICAL) ×3
ELECTRODE REM PT RTRN 9FT ADLT (ELECTROSURGICAL) ×1 IMPLANT
GAUZE SPONGE 4X4 12PLY STRL (GAUZE/BANDAGES/DRESSINGS) IMPLANT
GLOVE BIOGEL M 8.0 STRL (GLOVE) ×3 IMPLANT
GOWN STRL REUS W/TWL XL LVL3 (GOWN DISPOSABLE) ×12 IMPLANT
HANDLE STAPLE EGIA 4 XL (STAPLE) ×3 IMPLANT
HOVERMATT SINGLE USE (MISCELLANEOUS) ×3 IMPLANT
KIT BASIN OR (CUSTOM PROCEDURE TRAY) ×3 IMPLANT
LIQUID BAND (GAUZE/BANDAGES/DRESSINGS) ×2 IMPLANT
NDL SPNL 22GX3.5 QUINCKE BK (NEEDLE) ×1 IMPLANT
NEEDLE SPNL 22GX3.5 QUINCKE BK (NEEDLE) ×3 IMPLANT
PACK UNIVERSAL I (CUSTOM PROCEDURE TRAY) ×3 IMPLANT
PEN SKIN MARKING BROAD (MISCELLANEOUS) ×3 IMPLANT
QUICK LOAD TK 5 (STAPLE)
RELOAD STAPLE 45 PURP MED/THCK (STAPLE) IMPLANT
RELOAD TRI 45 ART MED THCK BLK (STAPLE) ×1 IMPLANT
RELOAD TRI 45 ART MED THCK PUR (STAPLE) ×3 IMPLANT
RELOAD TRI 60 ART MED THCK BLK (STAPLE) ×5 IMPLANT
RELOAD TRI 60 ART MED THCK PUR (STAPLE) ×7 IMPLANT
SCISSORS LAP 5X45 EPIX DISP (ENDOMECHANICALS) ×2 IMPLANT
SCRUB PCMX 4 OZ (MISCELLANEOUS) ×6 IMPLANT
SEALANT SURGICAL APPL DUAL CAN (MISCELLANEOUS) IMPLANT
SET IRRIG TUBING LAPAROSCOPIC (IRRIGATION / IRRIGATOR) ×3 IMPLANT
SHEARS CURVED HARMONIC AC 45CM (MISCELLANEOUS) ×3 IMPLANT
SLEEVE ADV FIXATION 5X100MM (TROCAR) ×6 IMPLANT
SLEEVE GASTRECTOMY 36FR VISIGI (MISCELLANEOUS) ×3 IMPLANT
SOLUTION ANTI FOG 6CC (MISCELLANEOUS) ×3 IMPLANT
SPONGE LAP 18X18 X RAY DECT (DISPOSABLE) ×3 IMPLANT
STAPLER VISISTAT 35W (STAPLE) ×3 IMPLANT
SUT SURGIDAC NAB ES-9 0 48 120 (SUTURE) IMPLANT
SUT VIC AB 4-0 SH 18 (SUTURE) ×3 IMPLANT
SUT VICRYL 0 TIES 12 18 (SUTURE) ×3 IMPLANT
SYR 20CC LL (SYRINGE) ×3 IMPLANT
SYR 50ML LL SCALE MARK (SYRINGE) ×3 IMPLANT
TOWEL OR 17X26 10 PK STRL BLUE (TOWEL DISPOSABLE) ×6 IMPLANT
TOWEL OR NON WOVEN STRL DISP B (DISPOSABLE) ×3 IMPLANT
TRAY FOLEY CATH 14FRSI W/METER (CATHETERS) IMPLANT
TROCAR ADV FIXATION 12X100MM (TROCAR) ×3 IMPLANT
TROCAR ADV FIXATION 5X100MM (TROCAR) ×3 IMPLANT
TROCAR BLADELESS 15MM (ENDOMECHANICALS) ×3 IMPLANT
TROCAR BLADELESS OPT 5 100 (ENDOMECHANICALS) ×3 IMPLANT
TUBE CALIBRATION LAPBAND (TUBING) IMPLANT
TUBING CONNECTING 10 (TUBING) ×2 IMPLANT
TUBING CONNECTING 10' (TUBING) ×1
TUBING ENDO SMARTCAP (MISCELLANEOUS) ×3 IMPLANT
TUBING FILTER THERMOFLATOR (ELECTROSURGICAL) ×3 IMPLANT

## 2014-04-23 NOTE — Progress Notes (Signed)
Dr. Conrad Walton made aware of patient's ETCO2  Numbers- to see patient.

## 2014-04-23 NOTE — Interval H&P Note (Signed)
History and Physical Interval Note:  04/23/2014 6:59 AM  Katherine Lewis  has presented today for surgery, with the diagnosis of MORBID OBESITY  The various methods of treatment have been discussed with the patient and family. After consideration of risks, benefits and other options for treatment, the patient has consented to  Procedure(s): LAPAROSCOPIC GASTRIC SLEEVE RESECTION (N/A) as a surgical intervention .  The patient's history has been reviewed, patient examined, no change in status, stable for surgery.  I have reviewed the patient's chart and labs.  Questions were answered to the patient's satisfaction.     Azlee Monforte B

## 2014-04-23 NOTE — Progress Notes (Signed)
CBC and Serum Creatinine drawn by lab.

## 2014-04-23 NOTE — Transfer of Care (Signed)
Immediate Anesthesia Transfer of Care Note  Patient: Katherine Lewis  Procedure(s) Performed: Procedure(s): LAPAROSCOPIC GASTRIC SLEEVE RESECTION WITH UPPER ENDOSCOPY (N/A)  Patient Location: PACU  Anesthesia Type:General  Level of Consciousness: awake, alert  and oriented  Airway & Oxygen Therapy: Patient Spontanous Breathing and Patient connected to face mask oxygen  Post-op Assessment: Report given to RN  Post vital signs: Reviewed and stable  Last Vitals:  Filed Vitals:   04/23/14 0522  BP: 145/85  Pulse: 95  Temp: 37 C  Resp: 18    Complications: No apparent anesthesia complications

## 2014-04-23 NOTE — H&P (View-Only) (Signed)
Chief Complaint:  For sleeve gastectomy on April 5th  History of Present Illness:  Katherine Lewis is an 44 y.o. female who was seen by me on New Year's Eve in consultation for bariatric surgery.  He had been to warmer seminars and we had discussed all 3 options.  She does not have diabetes per the mother has type 2 diabetes.  We talked about a gastric bypass but in the meantime she's done a lot of research and wanted to have a sleeve gastrectomy.  She does have some gastroesophageal reflux disease but her upper GI series was negative for significant hiatal hernia. She's never had DVT. She is prepped minimally for sleeve gastrectomy.  Today's weight was 264.4 with a BMI of 44.  Plan to proceed with sleeve gastrectomy.  Past Medical History  Diagnosis Date  . Depression   . Insomnia   . Plantar fasciitis   . Vitamin D deficiency   . Sleep apnea     Past Surgical History  Procedure Laterality Date  . No past surgeries      Current Outpatient Prescriptions  Medication Sig Dispense Refill  . amoxicillin-clavulanate (AUGMENTIN) 875-125 MG per tablet Take 1 tablet by mouth 2 (two) times daily. 14 tablet 0  . buPROPion (WELLBUTRIN XL) 150 MG 24 hr tablet Take 150 mg by mouth daily.    . cetirizine (ZYRTEC) 10 MG tablet Take 1 tablet (10 mg total) by mouth daily. 30 tablet 11  . escitalopram (LEXAPRO) 5 MG tablet Take 5 mg by mouth daily.    . fluconazole (DIFLUCAN) 150 MG tablet Take 1 tablet (150 mg total) by mouth once. Repeat if needed 2 tablet 0  . fluticasone (FLONASE) 50 MCG/ACT nasal spray Place 2 sprays into both nostrils daily. 16 g 12  . levonorgestrel (MIRENA) 20 MCG/24HR IUD 1 each by Intrauterine route once.    . pantoprazole (PROTONIX) 40 MG tablet TAKE 1 TABLET TWICE DAILY. 30 tablet 0  . pseudoephedrine (SUDAFED) 60 MG tablet Take 1 tablet (60 mg total) by mouth every 4 (four) hours as needed for congestion. 30 tablet 0   No current facility-administered medications for this  visit.   Codeine Family History  Problem Relation Age of Onset  . Lung cancer Paternal Grandmother   . Lung cancer Paternal Grandfather   . Diabetes type II Mother   . Diabetes Mother    Social History:   reports that she quit smoking about 22 years ago. Her smoking use included Cigarettes. She has a 3 pack-year smoking history. She has never used smokeless tobacco. She reports that she drinks alcohol. She reports that she does not use illicit drugs.   REVIEW OF SYSTEMS : Negative except for negative history of DVT.  Physical Exam:   There were no vitals taken for this visit. There is no weight on file to calculate BMI.  Gen:  WDWN white female NAD  Neurological: Alert and oriented to person, place, and time. Motor and sensory function is grossly intact  Head: Normocephalic and atraumatic.  Eyes: Conjunctivae are normal. Pupils are equal, round, and reactive to light. No scleral icterus.  Neck: Normal range of motion. Neck supple. No tracheal deviation or thyromegaly present.  Cardiovascular:  SR without murmurs or gallops.  No carotid bruits Breast:  Not examined Respiratory: Effort normal.  No respiratory distress. No chest wall tenderness. Breath sounds normal.  No wheezes, rales or rhonchi.  Abdomen:  Obese nontender GU:  Not examined Musculoskeletal: Normal range of motion.  Extremities are nontender. No cyanosis, edema or clubbing noted Lymphadenopathy: No cervical, preauricular, postauricular or axillary adenopathy is present Skin: Skin is warm and dry. No rash noted. No diaphoresis. No erythema. No pallor. Pscyh: Normal mood and affect. Behavior is normal. Judgment and thought content normal.   LABORATORY RESULTS: No results found for this or any previous visit (from the past 48 hour(s)).   RADIOLOGY RESULTS:  Upper GI was negative No results found.  Problem List: Patient Active Problem List   Diagnosis Date Noted  . Chronic cough 04/03/2012  . Right knee pain  06/11/2011    Assessment & Plan: Morbid obesity BMI 38fr laparoscopic sleeve gastrectomy.    Matt B. MHassell Done MD, FB[facility] [phone] [phone] 04/10/2014 1:36 PM

## 2014-04-23 NOTE — Anesthesia Postprocedure Evaluation (Signed)
Anesthesia Post Note  Patient: Katherine Lewis  Procedure(s) Performed: Procedure(s) (LRB): LAPAROSCOPIC GASTRIC SLEEVE RESECTION WITH UPPER ENDOSCOPY (N/A)  Anesthesia type: general  Patient location: PACU  Post pain: Pain level controlled  Post assessment: Patient's Cardiovascular Status Stable  Last Vitals:  Filed Vitals:   04/23/14 1330  BP: 135/79  Pulse: 79  Temp: 36.4 C  Resp: 16    Post vital signs: Reviewed and stable  Level of consciousness: sedated  Complications: No apparent anesthesia complications

## 2014-04-23 NOTE — Progress Notes (Signed)
CBC and Serum Creatinine results noted.

## 2014-04-23 NOTE — Progress Notes (Signed)
Dr. Conrad  in to see patient- O.K. To go to floor.

## 2014-04-23 NOTE — Anesthesia Preprocedure Evaluation (Signed)
Anesthesia Evaluation  Patient identified by MRN, date of birth, ID band Patient awake    Reviewed: Allergy & Precautions, NPO status , Patient's Chart, lab work & pertinent test results  Airway Mallampati: I  TM Distance: >3 FB Neck ROM: Full    Dental   Pulmonary sleep apnea , former smoker,          Cardiovascular hypertension, Pt. on medications     Neuro/Psych    GI/Hepatic GERD-  Medicated and Controlled,  Endo/Other    Renal/GU      Musculoskeletal   Abdominal   Peds  Hematology   Anesthesia Other Findings   Reproductive/Obstetrics                             Anesthesia Physical Anesthesia Plan  ASA: III  Anesthesia Plan: General   Post-op Pain Management:    Induction: Intravenous  Airway Management Planned: Oral ETT  Additional Equipment:   Intra-op Plan:   Post-operative Plan: Extubation in OR  Informed Consent: I have reviewed the patients History and Physical, chart, labs and discussed the procedure including the risks, benefits and alternatives for the proposed anesthesia with the patient or authorized representative who has indicated his/her understanding and acceptance.     Plan Discussed with: CRNA and Surgeon  Anesthesia Plan Comments:         Anesthesia Quick Evaluation

## 2014-04-23 NOTE — Op Note (Signed)
Surgeon: Kaylyn Lim, MD, FACS  Asst:  Adonis Housekeeper, MD, FACS  Anes:  General endotracheal  Procedure: Laparoscopic sleeve gastrectomy and upper endoscopy by Dr. Excell Seltzer  Diagnosis: Morbid obesity  Complications: none  EBL:   15 cc  Description of Procedure:  The patient was take to OR 1 and given general anesthesia.  The abdomen was prepped with PCMX and draped sterilely.  A timeout was performed.  Access to the abdomen was achieved with a 5 mm Optiview through the left upper quadrant.  All 5 mm trocars and one 15 mm trocar was used with the larger trocar lower and to the right of midline.  The Nathanson retractor was used and no dimple was seen.  UGI was normal.    Following insufflation, the state of the abdomen was found to be free of adhesions.  The ViSiGi 36Fr tube was inserted to deflate the stomach and was pulled back into the esophagus.    The pylorus was identified and we measured 5 cm back and marked the antrum.  At that point we began dissection to take down the greater curvature of the stomach using the Harmonic scalpel.  This dissection was taken all the way up to the left crus.  Posterior attachments of the stomach were also taken down.    The ViSiGi tube was then passed into the antrum and suction applied so that it was snug along the lessor curvature.  The "crow's foot" or incisura was identified.  The sleeve gastrectomy was begun using the [employer] stapler beginning with a 4.5 cm black load with TRS.  This was followed by two 6 black load and then the rest were purple load with TRS.  Marland Kitchen  When the sleeve was complete the tube was taken off suction and insufflated briefly.  The tube was withdrawn.  Upper endoscopy was then performed by Dr. Excell Seltzer.  No bleeding or leaks were noted.     The specimen was extracted through the 15 trocar site.  Wounds were infiltrated with Exaparel  and closed with 4-0 vicryl and Liquiban.    Katherine Lewis Done, [place], [facility]  Surgery, [place]

## 2014-04-24 ENCOUNTER — Inpatient Hospital Stay (HOSPITAL_COMMUNITY): Payer: BC Managed Care – PPO

## 2014-04-24 ENCOUNTER — Encounter (HOSPITAL_COMMUNITY): Payer: Self-pay | Admitting: Surgery

## 2014-04-24 LAB — CBC WITH DIFFERENTIAL/PLATELET
Basophils Absolute: 0 10*3/uL (ref 0.0–0.1)
Basophils Relative: 0 % (ref 0–1)
EOS ABS: 0.1 10*3/uL (ref 0.0–0.7)
Eosinophils Relative: 1 % (ref 0–5)
HCT: 36.1 % (ref 36.0–46.0)
Hemoglobin: 12.1 g/dL (ref 12.0–15.0)
LYMPHS ABS: 1.5 10*3/uL (ref 0.7–4.0)
LYMPHS PCT: 19 % (ref 12–46)
MCH: 31.3 pg (ref 26.0–34.0)
MCHC: 33.5 g/dL (ref 30.0–36.0)
MCV: 93.5 fL (ref 78.0–100.0)
Monocytes Absolute: 0.5 10*3/uL (ref 0.1–1.0)
Monocytes Relative: 7 % (ref 3–12)
NEUTROS PCT: 73 % (ref 43–77)
Neutro Abs: 5.8 10*3/uL (ref 1.7–7.7)
PLATELETS: 188 10*3/uL (ref 150–400)
RBC: 3.86 MIL/uL — AB (ref 3.87–5.11)
RDW: 13.9 % (ref 11.5–15.5)
WBC: 7.9 10*3/uL (ref 4.0–10.5)

## 2014-04-24 LAB — HEMOGLOBIN AND HEMATOCRIT, BLOOD
HCT: 36.9 % (ref 36.0–46.0)
Hemoglobin: 12.4 g/dL (ref 12.0–15.0)

## 2014-04-24 MED ORDER — IOHEXOL 300 MG/ML  SOLN
50.0000 mL | Freq: Once | INTRAMUSCULAR | Status: AC | PRN
  Administered 2014-04-24: 30 mL via ORAL

## 2014-04-24 NOTE — Progress Notes (Signed)
Patient ID: Katherine Lewis, female   DOB: March 06, 1970, 44 y.o.   MRN: [id number] [facility] Progress Note:   1 Day Post-Op  Subjective: Mental status is clear.  Feeling pretty good.  Objective: Vital signs in last 24 hours: Temp:  [97.6 F (36.4 C)-98.7 F (37.1 C)] 98.6 F (37 C) (04/06 0503) Pulse Rate:  [71-97] 85 (04/06 0503) Resp:  [7-21] 19 (04/06 0503) BP: (126-154)/(61-97) 142/84 mmHg (04/06 0503) SpO2:  [92 %-100 %] 99 % (04/06 0503) Weight:  [118.525 kg (261 lb 4.8 oz)] 118.525 kg (261 lb 4.8 oz) (04/06 0503)  Intake/Output from previous day: 04/05 0701 - 04/06 0700 In: 2548.3 [I.V.:2548.3] Out: 1550 [Urine:1550] Intake/Output this shift: Total I/O In: -  Out: 300 [Urine:300]  Physical Exam: Work of breathing is not elevated.  Sore as expected.    Lab Results:  Results for orders placed or performed during the hospital encounter of 04/23/14 (from the past 48 hour(s))  Pregnancy, urine STAT morning of surgery     Status: None   Collection Time: 04/23/14  5:42 AM  Result Value Ref Range   Preg Test, Ur NEGATIVE NEGATIVE    Comment:        THE SENSITIVITY OF THIS METHODOLOGY IS >20 mIU/mL.   Glucose, capillary     Status: Abnormal   Collection Time: 04/23/14  5:45 AM  Result Value Ref Range   Glucose-Capillary 100 (H) 70 - 99 mg/dL   Comment 1 Notify RN   CBC     Status: None   Collection Time: 04/23/14  9:57 AM  Result Value Ref Range   WBC 6.7 4.0 - 10.5 K/uL   RBC 4.13 3.87 - 5.11 MIL/uL   Hemoglobin 13.0 12.0 - 15.0 g/dL   HCT 37.9 36.0 - 46.0 %   MCV 91.8 78.0 - 100.0 fL   MCH 31.5 26.0 - 34.0 pg   MCHC 34.3 30.0 - 36.0 g/dL   RDW 13.8 11.5 - 15.5 %   Platelets 201 150 - 400 K/uL  Creatinine, serum     Status: Abnormal   Collection Time: 04/23/14  9:57 AM  Result Value Ref Range   Creatinine, Ser 1.25 (H) 0.50 - 1.10 mg/dL   GFR calc non Af Amer 52 (L) >90 mL/min   GFR calc Af Amer 60 (L) >90 mL/min    Comment: (NOTE) The eGFR has  been calculated using the CKD EPI equation. This calculation has not been validated in all clinical situations. eGFR's persistently <90 mL/min signify possible Chronic Kidney Disease.   CBC WITH DIFFERENTIAL     Status: Abnormal   Collection Time: 04/24/14  5:25 AM  Result Value Ref Range   WBC 7.9 4.0 - 10.5 K/uL   RBC 3.86 (L) 3.87 - 5.11 MIL/uL   Hemoglobin 12.1 12.0 - 15.0 g/dL   HCT 36.1 36.0 - 46.0 %   MCV 93.5 78.0 - 100.0 fL   MCH 31.3 26.0 - 34.0 pg   MCHC 33.5 30.0 - 36.0 g/dL   RDW 13.9 11.5 - 15.5 %   Platelets 188 150 - 400 K/uL   Neutrophils Relative % 73 43 - 77 %   Neutro Abs 5.8 1.7 - 7.7 K/uL   Lymphocytes Relative 19 12 - 46 %   Lymphs Abs 1.5 0.7 - 4.0 K/uL   Monocytes Relative 7 3 - 12 %   Monocytes Absolute 0.5 0.1 - 1.0 K/uL   Eosinophils Relative 1 0 - 5 %  Eosinophils Absolute 0.1 0.0 - 0.7 K/uL   Basophils Relative 0 0 - 1 %   Basophils Absolute 0.0 0.0 - 0.1 K/uL    Radiology/Results: No results found.  Anti-infectives: Anti-infectives    Start     Dose/Rate Route Frequency Ordered Stop   04/23/14 0554  cefOXitin (MEFOXIN) 2 g in dextrose 5 % 50 mL IVPB     2 g 100 mL/hr over 30 Minutes Intravenous On call to O.R. 04/23/14 0554 04/23/14 0852      Assessment/Plan: Problem List: Patient Active Problem List   Diagnosis Date Noted  . 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 Chronic cough 04/03/2012  . Right knee pain 06/11/2011    Await UGI.  Appears to be doing well thus far.  1 Day Post-Op    LOS: 1 day   Matt B. Hassell Done, MD, [facility] [phone] beeper [phone]  04/24/2014 8:25 AM

## 2014-04-24 NOTE — Progress Notes (Signed)
Nutrition Education Note  Received consult for diet education per DROP protocol.   Per pt she has not found a protein shake that she likes. She is lactose intolerant and does not like milk based drinks. We discussed alternatives. Pt plans to go by [facility] outpatient pharmacy to try strawberry Unjury.   Discussed 2 week post op diet with pt. Emphasized that liquids must be non carbonated, non caffeinated, and sugar free. Fluid goals discussed. Pt to follow up with outpatient bariatric RD for further diet progression after 2 weeks. Multivitamins and minerals also reviewed. Teach back method used, pt expressed understanding, expect good compliance.   Diet: First 2 Weeks  You will see the nutritionist about two (2) weeks after your surgery. The nutritionist will increase the types of foods you can eat if you are handling liquids well:  If you have severe vomiting or nausea and cannot handle clear liquids lasting longer than 1 day, call your surgeon  Protein Shake  Drink at least 2 ounces of shake 5-6 times per day  Each serving of protein shakes (usually 8 - 12 ounces) should have a minimum of:  15 grams of protein  And no more than 5 grams of carbohydrate  Goal for protein each day:  Men = 80 grams per day  Women = 60 grams per day  Protein powder may be added to fluids such as non-fat milk or Lactaid milk or Soy milk (limit to 35 grams added protein powder per serving)   Hydration  Slowly increase the amount of water and other clear liquids as tolerated (See Acceptable Fluids)  Slowly increase the amount of protein shake as tolerated  Sip fluids slowly and throughout the day  May use sugar substitutes in small amounts (no more than 6 - 8 packets per day; i.e. Splenda)   Fluid Goal  The first goal is to drink at least 8 ounces of protein shake/drink per day (or as directed by the nutritionist); some examples of protein shakes are Johnson & Johnson, AMR Corporation, EAS Edge HP, and Unjury. See  handout from pre-op Bariatric Education Class:  Slowly increase the amount of protein shake you drink as tolerated  You may find it easier to slowly sip shakes throughout the day  It is important to get your proteins in first  Your fluid goal is to drink 64 - 100 ounces of fluid daily  It may take a few weeks to build up to this  32 oz (or more) should be clear liquids  And  32 oz (or more) should be full liquids (see below for examples)  Liquids should not contain sugar, caffeine, or carbonation   Clear Liquids:  Water or Sugar-free flavored water (i.e. Fruit H2O, Propel)  Decaffeinated coffee or tea (sugar-free)  Crystal Lite, Wyler's Lite, Minute Maid Lite  Sugar-free Jell-O  Bouillon or broth  Sugar-free Popsicle: *Less than 20 calories each; Limit 1 per day   Full Liquids:  Protein Shakes/Drinks + 2 choices per day of other full liquids  Full liquids must be:  No More Than 12 grams of Carbs per serving  No More Than 3 grams of Fat per serving  Strained low-fat cream soup  Non-Fat milk  Fat-free Lactaid Milk  Sugar-free yogurt (Dannon Lite & Fit, Short Pump yogurt)    [place] RD, [place], [place] Pager [phone] After Hours Pager

## 2014-04-24 NOTE — Progress Notes (Signed)
Patient alert and oriented, Post op day 1.  Provided support and encouragement.  Encouraged pulmonary toilet, ambulation and small sips of liquids.  All questions answered.  Will continue to monitor.

## 2014-04-24 NOTE — Care Management Note (Signed)
    Page 1 of 1   04/24/2014     12:54:51 PM CARE MANAGEMENT NOTE 04/24/2014  Patient:  [facility]   Account Number:  [id number]  Date Initiated:  04/24/2014  Documentation initiated by:  Sunday Spillers  Subjective/Objective Assessment:   44 yo female admitted s/p sleeve gastrectomy. PTA lived at home with significant other.     Action/Plan:   home when stable   Anticipated DC Date:  04/26/2014   Anticipated DC Plan:  [place]  CM consult      Choice offered to / List presented to:             Status of service:  Completed, signed off Medicare Important Message given?   (If response is "NO", the following Medicare IM given date fields will be blank) Date Medicare IM given:   Medicare IM given by:   Date Additional Medicare IM given:   Additional Medicare IM given by:    Discharge Disposition:  HOME/SELF CARE  Per UR Regulation:  Reviewed for med. necessity/level of care/duration of stay  If discussed at [place] of Stay Meetings, dates discussed:    Comments:

## 2014-04-25 NOTE — Discharge Instructions (Signed)
GASTRIC BYPASS/SLEEVE  Home Care Instructions   These instructions are to help you care for yourself when you go home.  Call: If you have any problems.  Call [phone] and ask for the surgeon on call  If you need immediate assistance come to the ER at [facility]. Tell the ER staff you are a new post-op gastric bypass or gastric sleeve patient  Signs and symptoms to report:  Severe  vomiting or nausea o If you cannot handle clear liquids for longer than 1 day, call your surgeon  Abdominal pain which does not get better after taking your pain medication  Fever greater than 100.4  F and chills  Heart rate over 100 beats a minute  Trouble breathing  Chest pain  Redness,  swelling, drainage, or foul odor at incision (surgical) sites  If your incisions open or pull apart  Swelling or pain in calf (lower leg)  Diarrhea (Loose bowel movements that happen often), frequent watery, uncontrolled bowel movements  Constipation, (no bowel movements for 3 days) if this happens: o Take Milk of Magnesia, 2 tablespoons by mouth, 3 times a day for 2 days if needed o Stop taking Milk of Magnesia once you have had a bowel movement o Call your doctor if constipation continues Or o Take Miralax  (instead of Milk of Magnesia) following the label instructions o Stop taking Miralax once you have had a bowel movement o Call your doctor if constipation continues  Anything you think is abnormal for you   Normal side effects after surgery:  Unable to sleep at night or unable to concentrate  Irritability  Being tearful (crying) or depressed  These are common complaints, possibly related to your anesthesia, stress of surgery, and change in lifestyle, that usually go away a few weeks after surgery. If these feelings continue, call your medical doctor.  Wound Care: You may have surgical glue, steri-strips, or staples over your incisions after surgery  Surgical glue: Looks like clear  film over your incisions and will wear off a little at a time  Steri-strips: Adhesive strips of tape over your incisions. You may notice a yellowish color on skin under the steri-strips. This is used to make the steri-strips stick better. Do not pull the steri-strips off - let them fall off  Staples: Staples may be removed before you leave the hospital o If you go home with staples, call [facility] for an appointment with your surgeons nurse to have staples removed 10 days after surgery, [phone]  Showering: You may shower two (2) days after your surgery unless your surgeon tells you differently o Wash gently around incisions with warm soapy water, rinse well, and gently pat dry o If you have a drain (tube from your incision), you may need someone to hold this while you shower o No tub baths until staples are removed and incisions are healed   Medications:  Medications should be liquid or crushed if larger than the size of a dime  Extended release pills (medication that releases a little bit at a time through the  day) should not be crushed  Depending on the size and number of medications you take, you may need to space (take a few throughout the day)/change the time you take your medications so that you do not over-fill your pouch (smaller stomach)  Make sure you follow-up with you primary care physician to make medication changes needed during rapid weight loss and life -style changes  If you have diabetes, follow up with your doctor that orders your diabetes medication(s) within one week after surgery and check your blood sugar regularly   Do not drive while taking narcotics (pain medications)   Do not take acetaminophen (Tylenol) and Roxicet or Lortab Elixir at the same time since these pain medications contain acetaminophen   Diet:  First 2 Weeks You will see the nutritionist about two (2) weeks after your surgery. The nutritionist will increase the types of  foods you can eat if you are handling liquids well:  If you have severe vomiting or nausea and cannot handle clear liquids lasting longer than 1 day call your surgeon Protein Shake  Drink at least 2 ounces of shake 5-6 times per day  Each serving of protein shakes (usually 8-12 ounces) should have a minimum of: o 15 grams of protein o And no more than 5 grams of carbohydrate  Goal for protein each day: o Men = 80 grams per day o Women = 60 grams per day     Protein powder may be added to fluids such as non-fat milk or Lactaid milk or Soy milk (limit to 35 grams added protein powder per serving)  Hydration  Slowly increase the amount of water and other clear liquids as tolerated (See Acceptable Fluids)  Slowly increase the amount of protein shake as tolerated  Sip fluids slowly and throughout the day  May use sugar substitutes in small amounts (no more than 6-8 packets per day; i.e. Splenda)  Fluid Goal  The first goal is to drink at least 8 ounces of protein shake/drink per day (or as directed by the nutritionist); some examples of protein shakes are Johnson & Johnson, AMR Corporation, EAS Edge HP, and Unjury. - See handout from pre-op Bariatric Education Class: o Slowly increase the amount of protein shake you drink as tolerated o You may find it easier to slowly sip shakes throughout the day o It is important to get your proteins in first  Your fluid goal is to drink 64-100 ounces of fluid daily o It may take a few weeks to build up to this   32 oz. (or more) should be clear liquids And  32 oz. (or more) should be full liquids (see below for examples)  Liquids should not contain sugar, caffeine, or carbonation  Clear Liquids:  Water of Sugar-free flavored water (i.e. Fruit HO, Propel)  Decaffeinated coffee or tea (sugar-free)  Crystal lite, Wylers Lite, Minute Maid Lite  Sugar-free Jell-O  Bouillon or broth  Sugar-free Popsicle:    - Less than 20 calories  each; Limit 1 per day  Full Liquids:                   Protein Shakes/Drinks + 2 choices per day of other full liquids  Full liquids must be: o No More Than 12 grams of Carbs per serving o No More Than 3 grams of Fat per serving  Strained low-fat cream soup  Non-Fat milk  Fat-free Lactaid Milk  Sugar-free yogurt (Dannon Lite & Fit, Greek yogurt)    Vitamins and Minerals  Start 1 day after surgery unless otherwise directed by your surgeon  2 Chewable Multivitamin / Multimineral Supplement with iron (i.e. Centrum for Adults)  Vitamin B-12, 350-500 micrograms sub-lingual (place tablet under the tongue) each day  Chewable Calcium Citrate with Vitamin D-3 (Example: 3 Chewable Calcium  Plus 600 with Vitamin D-3) o Take 500 mg three (3) times a day for  a total of 1500 mg each day o Do not take all 3 doses of calcium at one time as it may cause constipation, and you can only absorb 500 mg at a time o Do not mix multivitamins containing iron with calcium supplements;  take 2 hours apart o Do not substitute Tums (calcium carbonate) for your calcium  Menstruating women and those at risk for anemia ( a blood disease that causes weakness) may need extra iron o Talk to your doctor to see if you need more iron  If you need extra iron: Total daily Iron recommendation (including Vitamins) is 50 to 100 mg Iron/day  Do not stop taking or change any vitamins or minerals until you talk to your nutritionist or surgeon  Your nutritionist and/or surgeon must approve all vitamin and mineral supplements   Activity and Exercise: It is important to continue walking at home. Limit your physical activity as instructed by your doctor. During this time, use these guidelines:  Do not lift anything greater than ten  (10) pounds for at least two (2) weeks  Do not go back to work or drive until Engineer, production says you can  You may have sex when you feel comfortable o It is VERY important for female  patients to use a reliable birth control method; fertility often increase after surgery o Do not get pregnant for at least 18 months  Start exercising as soon as your doctor tells you that you can o Make sure your doctor approves any physical activity  Start with a simple walking program  Walk 5-15 minutes each day, 7 days per week  Slowly increase until you are walking 30-45 minutes per day  Consider joining our [place] program. [phone] or email [email or web address]   Special Instructions Things to remember:  Free counseling is available for you and your family through collaboration between [facility] and [place]. Please call [phone] and leave a message  Use your CPAP when sleeping if this applies to you  Consider buying a medical alert bracelet that says you had lap-band surgery     You will likely have your first fill (fluid added to your band) 6 - 8 weeks after surgery  [facility] has a free Bariatric Surgery Support Group that meets monthly, the 3rd Thursday, [place]. You can see classes online at [email or web address]  It is very important to keep all follow up appointments with your surgeon, nutritionist, primary care physician, and behavioral health practitioner o After the first year, please follow up with your bariatric surgeon and nutritionist at least once a year in order to maintain best weight loss results                    [facility]:  [place]: [phone]               Bariatric Nurse Coordinator: [phone]  Gastric Bypass/Sleeve Home Care Instructions  Rev. 02/2012                                                         Reviewed and Vilinda Boehringer  by [facility] Patient Education Committee, Jan, 2014

## 2014-04-25 NOTE — Discharge Summary (Signed)
Physician Discharge Summary  Patient ID: Katherine Lewis MRN: [id number] DOB/AGE: 1970-03-18 44 y.o.  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  UGI on PD 1 looked good.  Started on liquids and advanced.  Ready for discharge on PD 2.  Unable to draw blood before discharge.  Consults: none  Significant Diagnostic Studies: UGI    Discharge Exam: Blood pressure 167/92, pulse 93, temperature 98.8 F (37.1 C), temperature source Oral, resp. rate 18, height 5' 5"  (1.651 m), weight 117.981 kg (260 lb 1.6 oz), SpO2 95 %. Incisions ok  Disposition: Final discharge disposition not confirmed  Discharge Instructions    Ambulate hourly while awake    Complete by:  As directed      Call MD for:  difficulty breathing, headache or visual disturbances    Complete by:  As directed      Call MD for:  persistant dizziness or light-headedness    Complete by:  As directed      Call MD for:  persistant nausea and vomiting    Complete by:  As directed      Call MD for:  redness, tenderness, or signs of infection (pain, swelling, redness, odor or green/yellow discharge around incision site)    Complete by:  As directed      Call MD for:  severe uncontrolled pain    Complete by:  As directed      Call MD for:  temperature >101 F    Complete by:  As directed      Diet bariatric full liquid    Complete by:  As directed      Discharge instructions    Complete by:  As directed   Shower ad lib Follow dietary guidelines     Incentive spirometry    Complete by:  As directed   Perform hourly while awake            Medication List    STOP taking these medications        amoxicillin-clavulanate 875-125 MG per tablet  Commonly known as:  AUGMENTIN      TAKE  these medications        cetirizine 10 MG tablet  Commonly known as:  ZYRTEC  Take 1 tablet (10 mg total) by mouth daily.     cholecalciferol 1000 UNITS tablet  Commonly known as:  VITAMIN D  Take 1,000 Units by mouth daily.     escitalopram 5 MG tablet  Commonly known as:  LEXAPRO  Take 5 mg by mouth every morning.     fluconazole 150 MG tablet  Commonly known as:  DIFLUCAN  Take 1 tablet (150 mg total) by mouth once. Repeat if needed     fluticasone 50 MCG/ACT nasal spray  Commonly known as:  FLONASE  Place 2 sprays into both nostrils daily.     levonorgestrel 20 MCG/24HR IUD  Commonly known as:  MIRENA  1 each by Intrauterine route once.     losartan-hydrochlorothiazide 50-12.5 MG per tablet  Commonly known as:  HYZAAR  Take 1 tablet by mouth every evening.     pantoprazole 40 MG tablet  Commonly known as:  PROTONIX  TAKE 1 TABLET TWICE DAILY.     pseudoephedrine 60 MG tablet  Commonly known as:  SUDAFED  Take 1 tablet (60 mg total) by mouth every 4 (four) hours as needed for congestion.     WELLBUTRIN XL 150 MG 24 hr tablet  Generic drug:  buPROPion  Take 150 mg by mouth every morning.           Follow-up Information    Follow up with Pedro Earls, MD. Go on 05/09/2014.   Specialty:  General Surgery   Why:  at 10:45AM , For Post-Op Check   Contact information:   [address] [place] [address] [phone]       Signed: Pedro Earls 04/25/2014, 8:11 AM

## 2014-04-25 NOTE — Progress Notes (Signed)
Patient alert and oriented, pain is controlled. Patient is tolerating fluid, advanced to protein shake today, patient tolerated well.  Reviewed Gastric sleeve discharge instructions with patient and patient is able to articulate understanding.  Provided information on BELT program, Support Group and [facility] outpatient pharmacy. All questions answered, will continue to monitor.

## 2014-04-25 NOTE — Progress Notes (Signed)
Discharge instructions given. Questions answered

## 2014-04-26 ENCOUNTER — Telehealth (HOSPITAL_COMMUNITY): Payer: Self-pay

## 2014-04-26 NOTE — Telephone Encounter (Addendum)
Patient Asleep, left message with mother for patient to return call.  Patient returned call 11:25AM  Made discharge phone call to patient per DROP protocol. Asking the following questions.    1. Do you have someone to care for you now that you are home?  yes 2. Are you having pain now that is not relieved by your pain medication?  no 3. Are you able to drink the recommended daily amount of fluids (48 ounces minimum/day) and protein (60-80 grams/day) as prescribed by the dietitian or nutritional counselor?  i didn't get it all in yesterday but I am having trouble with protein shakes that don't taste good.   4. Are you taking the vitamins and minerals as prescribed?  yes 5. Do you have the "on call" number to contact your surgeon if you have a problem or question?  yes 6. Are your incisions free of redness, swelling or drainage? (If steri strips, address that these can fall off, shower as tolerated) yes 7. Have your bowels moved since your surgery?  If not, are you passing gas? yes  8. Are you up and walking 3-4 times per day?  yes    1. Do you have an appointment made to see your surgeon in the next month?  yes 2. Were you provided your discharge medications before your surgery or before you were discharged from the hospital and are you taking them without problem?  yes 3. Were you provided phone numbers to the clinic/surgeon's office?  yes 4. Did you watch the patient education video module in the (clinic, surgeon's office, etc.) before your surgery? yes 5. Do you have a discharge checklist that was provided to you in the hospital to reference with instructions on how to take care of yourself after surgery?  yes 6. Did you see a dietitian or nutritional counselor while you were in the hospital?  yes 7. Do you have an appointment to see a dietitian or nutritional counselor in the next month?  yes

## 2014-05-07 ENCOUNTER — Encounter: Payer: BC Managed Care – PPO | Attending: Surgery

## 2014-05-07 DIAGNOSIS — Z6841 Body Mass Index (BMI) 40.0 and over, adult: Secondary | ICD-10-CM | POA: Diagnosis not present

## 2014-05-07 DIAGNOSIS — Z713 Dietary counseling and surveillance: Secondary | ICD-10-CM | POA: Diagnosis not present

## 2014-05-07 NOTE — Progress Notes (Signed)
Bariatric Class:  Appt start time: 1530 end time:  1630.  2 Week Post-Operative Nutrition Class  Patient was seen on 05/07/2014 for Post-[facility]ative Nutrition [facility]   Surgery date: 04/22/2104 Surgery type: gastric sleeve Start weight at [facility]: 261 lbs on 01/26/14 Weight today: 245 lbs  Weight change: 22 lbs  TANITA  BODY COMP RESULTS  04/01/14 05/07/14   BMI (kg/m^2) 44.4 40.8   Fat Mass (lbs) 127 119.0   Fat Free Mass (lbs) 140 126.0   Total Body Water (lbs) 102.5 92.0    The following the learning objectives were met by the patient during this course:  Identifies Phase 3A (Soft, High Proteins) Dietary Goals and will begin from 2 weeks post-operatively to 2 months post-operatively  Identifies appropriate sources of fluids and proteins   States protein recommendations and appropriate sources post-operatively  Identifies the need for appropriate texture modifications, mastication, and bite sizes when consuming solids  Identifies appropriate multivitamin and calcium sources post-operatively  Describes the need for physical activity post-operatively and will follow MD recommendations  States when to call healthcare provider regarding medication questions or post-operative complications  Handouts given during class include:  Phase 3A: Soft, High Protein Diet Handout  Follow-Up Plan: Patient will follow-up at [facility] in 6 weeks for 2 month post-op nutrition visit for diet advancement per MD.

## 2014-06-18 ENCOUNTER — Ambulatory Visit: Payer: BC Managed Care – PPO | Admitting: Dietician

## 2014-08-01 ENCOUNTER — Other Ambulatory Visit (INDEPENDENT_AMBULATORY_CARE_PROVIDER_SITE_OTHER): Payer: Self-pay | Admitting: Physician Assistant

## 2014-08-01 DIAGNOSIS — R1011 Right upper quadrant pain: Secondary | ICD-10-CM

## 2014-08-08 ENCOUNTER — Other Ambulatory Visit: Payer: BC Managed Care – PPO

## 2014-08-16 ENCOUNTER — Ambulatory Visit
Admission: RE | Admit: 2014-08-16 | Discharge: 2014-08-16 | Disposition: A | Discharge status: Home / Self Care | Payer: BC Managed Care – PPO | Source: Ambulatory Visit | Attending: Physician Assistant | Admitting: Physician Assistant

## 2014-08-16 DIAGNOSIS — R1011 Right upper quadrant pain: Secondary | ICD-10-CM

## 2014-11-02 ENCOUNTER — Ambulatory Visit (INDEPENDENT_AMBULATORY_CARE_PROVIDER_SITE_OTHER): Payer: BC Managed Care – PPO | Admitting: Family Medicine

## 2014-11-02 ENCOUNTER — Encounter: Payer: Self-pay | Admitting: Family Medicine

## 2014-11-02 VITALS — BP 130/83 | HR 75 | Temp 97.9°F | Resp 18 | Ht 65.0 in | Wt 216.0 lb

## 2014-11-02 DIAGNOSIS — L710 Perioral dermatitis: Secondary | ICD-10-CM

## 2014-11-02 MED ORDER — MINOCYCLINE HCL 50 MG PO CAPS: 50.0000 mg | ORAL_CAPSULE | Freq: Every day | ORAL | Status: DC

## 2014-11-02 NOTE — Patient Instructions (Signed)
The rash is called perioral dermatitis

## 2014-11-02 NOTE — Progress Notes (Signed)
@[facility] @  This chart was scribed for Katherine Haber, MD by Thea Alken, ED Scribe. This patient was seen in room 8 and the patient's care was started at 11:04 AM.  Patient ID: Katherine Lewis MRN: [id number], DOB: 02/27/1970, 44 y.o. Date of Encounter: 11/02/2014, 11:06 AM  Primary Physician: Vidal Schwalbe, MD  Chief Complaint:  Chief Complaint  Patient presents with   Eczema    on face x 2 month-tried neosporin    HPI: 44 y.o. year old female with history below presents with a erythematous dry skin around her mouth for the past 2 months.  Pt states she has been breaking out around her mouth with dry, peeling, red skin. She has had similar symptoms in the past. She has tried going to the tanning bed which has helped in the past but did not have relief to current symptoms. Pt recently started a new job within  [employer], working with [employer]. States her job is stressful possibly causing symptoms.  Pt reports hx of bariatric surgery about 6 months ago.  Past Medical History  Diagnosis Date   Depression    Insomnia    Plantar fasciitis    Vitamin D deficiency    Hypertension    Sleep apnea     does not wear Cpap   Hypothyroidism    Anxiety    GERD (gastroesophageal reflux disease)      Home Meds: Prior to Admission medications   Medication Sig Start Date End Date Taking? Authorizing Provider  buPROPion (WELLBUTRIN XL) 150 MG 24 hr tablet Take 150 mg by mouth every morning.    Yes Historical Provider, MD  cholecalciferol (VITAMIN D) 1000 UNITS tablet Take 1,000 Units by mouth daily.   Yes Historical Provider, MD  escitalopram (LEXAPRO) 5 MG tablet Take 5 mg by mouth every morning.    Yes Historical Provider, MD  levonorgestrel (MIRENA) 20 MCG/24HR IUD 1 each by Intrauterine route once.   Yes Historical Provider, MD  cetirizine (ZYRTEC) 10 MG tablet Take 1 tablet (10 mg total) by mouth daily. Patient not taking: Reported on 04/15/2014 01/02/14    Araceli Bouche, PA  fluconazole (DIFLUCAN) 150 MG tablet Take 1 tablet (150 mg total) by mouth once. Repeat if needed Patient not taking: Reported on 04/15/2014 01/03/14   Araceli Bouche, PA  fluticasone (FLONASE) 50 MCG/ACT nasal spray Place 2 sprays into both nostrils daily. Patient not taking: Reported on 04/15/2014 01/02/14   Araceli Bouche, PA  losartan-hydrochlorothiazide (HYZAAR) 50-12.5 MG per tablet Take 1 tablet by mouth every evening.    Historical Provider, MD  pantoprazole (PROTONIX) 40 MG tablet TAKE 1 TABLET TWICE DAILY. Patient not taking: Reported on 11/02/2014 04/19/12   Kathee Delton, MD  pseudoephedrine (SUDAFED) 60 MG tablet Take 1 tablet (60 mg total) by mouth every 4 (four) hours as needed for congestion. Patient not taking: Reported on 04/15/2014 01/02/14   Araceli Bouche, PA    Allergies:  Allergies  Allergen Reactions   Codeine Nausea Only    Social History   Social History   Marital Status: Single    Spouse Name: N/A   Number of Children: N/A   Years of Education: N/A   Occupational History   Educator [employer]   Social History Main Topics   Smoking status: Former Smoker -- 0.50 packs/day for 6 years    Types: Cigarettes    Quit date: 01/19/1992   Smokeless tobacco: Never Used     Comment: social  Alcohol Use: Yes     Comment: social    Drug Use: No   Sexual Activity: Not on file   Other Topics Concern   Not on file   Social History Narrative     Review of Systems: Constitutional: negative for chills, fever, night sweats, weight changes, or fatigue  HEENT: negative for vision changes, hearing loss, congestion, rhinorrhea, ST, epistaxis, or sinus pressure Cardiovascular: negative for chest pain or palpitations Respiratory: negative for hemoptysis, wheezing, shortness of breath, or cough Abdominal: negative for abdominal pain, nausea, vomiting, diarrhea, or constipation Dermatological: negative for rash Neurologic: negative  for headache, dizziness, or syncope All other systems reviewed and are otherwise negative with the exception to those above and in the HPI.   Physical Exam: Blood pressure 130/83, pulse 75, temperature 97.9 F (36.6 C), temperature source Oral, resp. rate 18, height 5' 5"  (1.651 m), weight 216 lb (97.977 kg), SpO2 98 %., Body mass index is 35.94 kg/(m^2). General: Well developed, well nourished, in no acute distress. Head: Normocephalic, atraumatic, eyes without discharge, sclera non-icteric, nares are without discharge. Bilateral auditory canals clear, TM's are without perforation, pearly grey and translucent with reflective cone of light bilaterally. Oral cavity moist, posterior pharynx without exudate, erythema, peritonsillar abscess, or post nasal drip.  Neck: Supple. No thyromegaly. Full ROM. No lymphadenopathy. Lungs: Clear bilaterally to auscultation without wheezes, rales, or rhonchi. Breathing is unlabored. Heart: RRR with S1 S2. No murmurs, rubs, or gallops appreciated. Abdomen: Soft, non-tender, non-distended with normoactive bowel sounds. No hepatomegaly. No rebound/guarding. No obvious abdominal masses. Msk:  Strength and tone normal for age. Extremities/Skin: Warm and dry. No clubbing or cyanosis. No edema. Perioral papules with crusting Neuro: Alert and oriented X 3. Moves all extremities spontaneously. Gait is normal. CNII-XII grossly in tact. Psych:  Responds to questions appropriately with a normal affect.   ASSESSMENT AND PLAN:  44 y.o. year old female with  This chart was scribed in my presence and reviewed by me personally.    ICD-9-CM ICD-10-CM   1. Perioral dermatitis 695.3 L71.0 minocycline (MINOCIN) 50 MG capsule    By signing my name below, I, Raven Small, attest that this documentation has been prepared under the direction and in the presence of Katherine Haber, MD.  Electronically Signed: Thea Alken, ED Scribe. 11/02/2014. 11:16 AM.  Signed, Katherine Haber,  MD 11/02/2014 11:06 AM

## 2014-12-25 ENCOUNTER — Ambulatory Visit (INDEPENDENT_AMBULATORY_CARE_PROVIDER_SITE_OTHER): Payer: BC Managed Care – PPO | Admitting: Podiatry

## 2014-12-25 ENCOUNTER — Ambulatory Visit (INDEPENDENT_AMBULATORY_CARE_PROVIDER_SITE_OTHER): Payer: BC Managed Care – PPO

## 2014-12-25 VITALS — BP 134/97 | HR 79 | Resp 16 | Ht 65.0 in | Wt 210.0 lb

## 2014-12-25 DIAGNOSIS — M2041 Other hammer toe(s) (acquired), right foot: Secondary | ICD-10-CM | POA: Diagnosis not present

## 2014-12-25 DIAGNOSIS — M79671 Pain in right foot: Secondary | ICD-10-CM

## 2014-12-25 DIAGNOSIS — L600 Ingrowing nail: Secondary | ICD-10-CM

## 2014-12-25 NOTE — Patient Instructions (Signed)
ANTIBACTERIAL SOAP INSTRUCTIONS  THE DAY AFTER PROCEDURE  Please follow the instructions your doctor has marked.   Shower as usual. Before getting out, place a drop of antibacterial liquid soap (Dial) on a wet, clean washcloth.  Gently wipe washcloth over affected area.  Afterward, rinse the area with warm water.  Blot the area dry with a soft cloth and cover with antibiotic ointment (neosporin, polysporin, bacitracin) and band aid or gauze and tape  Place 3-4 drops of antibacterial liquid soap in a quart of warm tap water.  Submerge foot into water for 20 minutes.  If bandage was applied after your procedure, leave on to allow for easy lift off, then remove and continue with soak for the remaining time.  Next, blot area dry with a soft cloth and cover with a bandage.  Apply other medications as directed by your doctor, such as cortisporin otic solution (eardrops) or neosporin antibiotic ointment   Long Term Care Instructions-Post Nail Surgery  You have had your ingrown toenail and root treated with a chemical.  This chemical causes a burn that will drain and ooze like a blister.  This can drain for 6-8 weeks or longer.  It is important to keep this area clean, covered, and follow the soaking instructions dispensed at the time of your surgery.  This area will eventually dry and form a scab.  Once the scab forms you no longer need to soak or apply a dressing.  If at any time you experience an increase in pain, redness, swelling, or drainage, you should contact the office as soon as possible.

## 2014-12-25 NOTE — Progress Notes (Signed)
   Subjective:    Patient ID: Katherine Lewis, female    DOB: 01-Oct-1970, 44 y.o.   MRN: [id number]  HPI Patient presents with callouses on their right foot; 5th toe-both sides; 4th toe-lateral side; x6 years.  Patient presents with having a bilateral nail problem, great toes-both sides. Pt wants checked for ingrown toenails; pt stated, "feels pain in both nails"; x10 years.     Review of Systems  All other systems reviewed and are negative.      Objective:   Physical Exam        Assessment & Plan:

## 2014-12-25 NOTE — Progress Notes (Signed)
Subjective:     Patient ID: Katherine Lewis, female   DOB: 11/28/1970, 44 y.o.   MRN: [id number]  HPI patient states I'm having a lot of pain between my fourth and fifth toes of my right foot and it's been going on for years and I've tried trimming and padding it's not getting better and getting worse and I need to have it fixed and I also have ingrown toenails on both my big toes that I could no longer cut and they are becoming increasingly painful   Review of Systems  All other systems reviewed and are negative.      Objective:   Physical Exam  Constitutional: She is oriented to person, place, and time.  Cardiovascular: Intact distal pulses.   Musculoskeletal: Normal range of motion.  Neurological: She is oriented to person, place, and time.  Skin: Skin is warm.  Nursing note and vitals reviewed.  neurovascular status found to be intact with muscle strength adequate range of motion within normal limits. Patient's noted to have keratotic lesion fourth digit right fifth digit right and between the fourth and fifth toes on the right fifth toe with pain and corn formation and failure to respond to trimming techniques and medicated pads. 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 We discussed this condition at great length and reviewed the causes of the digital deformities and the long-term nature of them and the failure to respond to conservative care along with chronic ingrown toenails. Patient wants to have them fixed and at this time I allowed her to read consent form for correction reviewing arthroplasty digits 45 right and exostectomy medial side digit 5 right. I then  discussed with her ingrown toenail removal of the medial lateral borders which she wants to get done will be scheduled for a week from Friday. As far as a hammertoes I allowed her to read consent form reviewing alternative treatments and complications and she understands all this as outlined on the consent form signed consent form is given all preoperative instructions. Scheduled for outpatient surgery

## 2014-12-30 ENCOUNTER — Telehealth: Payer: Self-pay | Admitting: *Deleted

## 2014-12-30 NOTE — Telephone Encounter (Signed)
Called patient at [phone] (Home #) to check to see how they were doing from their ingrown toenail procedure that was performed on Wednesday, December 25, 2014. Patient's partner stated, "Katherine Lewis is doing okay".

## 2015-01-03 ENCOUNTER — Ambulatory Visit (INDEPENDENT_AMBULATORY_CARE_PROVIDER_SITE_OTHER): Payer: BC Managed Care – PPO | Admitting: Podiatry

## 2015-01-03 ENCOUNTER — Encounter: Payer: Self-pay | Admitting: Podiatry

## 2015-01-03 VITALS — BP 138/103 | HR 82 | Resp 16

## 2015-01-03 DIAGNOSIS — L600 Ingrowing nail: Secondary | ICD-10-CM | POA: Diagnosis not present

## 2015-01-03 MED ORDER — NEOMYCIN-POLYMYXIN-HC 3.5-10000-1 OT SOLN: OTIC | Status: DC

## 2015-01-03 NOTE — Patient Instructions (Signed)
ANTIBACTERIAL SOAP INSTRUCTIONS  THE DAY AFTER PROCEDURE  Please follow the instructions your doctor has marked.   Shower as usual. Before getting out, place a drop of antibacterial liquid soap (Dial) on a wet, clean washcloth.  Gently wipe washcloth over affected area.  Afterward, rinse the area with warm water.  Blot the area dry with a soft cloth and cover with antibiotic ointment (neosporin, polysporin, bacitracin) and band aid or gauze and tape  Place 3-4 drops of antibacterial liquid soap in a quart of warm tap water.  Submerge foot into water for 20 minutes.  If bandage was applied after your procedure, leave on to allow for easy lift off, then remove and continue with soak for the remaining time.  Next, blot area dry with a soft cloth and cover with a bandage.  Apply other medications as directed by your doctor, such as cortisporin otic solution (eardrops) or neosporin antibiotic ointment   Long Term Care Instructions-Post Nail Surgery  You have had your ingrown toenail and root treated with a chemical.  This chemical causes a burn that will drain and ooze like a blister.  This can drain for 6-8 weeks or longer.  It is important to keep this area clean, covered, and follow the soaking instructions dispensed at the time of your surgery.  This area will eventually dry and form a scab.  Once the scab forms you no longer need to soak or apply a dressing.  If at any time you experience an increase in pain, redness, swelling, or drainage, you should contact the office as soon as possible.

## 2015-01-12 NOTE — Progress Notes (Signed)
Subjective:     Patient ID: Katherine Lewis, female   DOB: Dec 15, 1970, 44 y.o.   MRN: [id number]  HPI patient states she's ready to get these ingrown toenails fixed and states they bother her a lot   Review of Systems     Objective:   Physical Exam  neurovascular status intact with incurvated hallux nailbeds bilateral with pain in the medial and lateral borders and inability to wear shoe gear with any degree of  comfort    Assessment:      ingrown toenail deformity hallux bilateral medial lateral borders    Plan:      H&P conditions reviewed and recommended correction of the ingrown toenails. At this point I did explain procedures and risk and I infiltrated each hallux 60 mg Xylocaine Marcaine mixture and remove the medial and lateral border and exposed the matrix applying phenol 3 applications 30 seconds followed by alcohol lavage and sterile dressing. Given instructions on soaks and reappoint

## 2015-01-15 ENCOUNTER — Telehealth: Payer: Self-pay | Admitting: *Deleted

## 2015-01-15 DIAGNOSIS — M2041 Other hammer toe(s) (acquired), right foot: Secondary | ICD-10-CM | POA: Diagnosis not present

## 2015-01-15 DIAGNOSIS — M25774 Osteophyte, right foot: Secondary | ICD-10-CM | POA: Diagnosis not present

## 2015-01-15 NOTE — Telephone Encounter (Signed)
Called patient at [phone] (Home #) to check to see how they were doing from their ingrown toenail procedure that was performed on Friday, January 03, 2015. Waiting for a response.

## 2015-01-17 ENCOUNTER — Telehealth: Payer: Self-pay | Admitting: *Deleted

## 2015-01-17 NOTE — Telephone Encounter (Signed)
Pt states she has questions about her surgery foot.  Pt states had surgery couple days ago, and her dressing is very tight and the foot feels itchy.  I told pt to remove the boot, open-ended sock, ace wrap without disturbing the gauze dressing, elevate foot 15 minutes, then place foot level and rewrap ace looser than previously from the toes up the leg if necessary, replace the sock and surgical boot.  Stay in the boot at all times, may open boot and rotate foot if going to rest, but must sleep in the boot, and walk in the boot.

## 2015-01-22 ENCOUNTER — Ambulatory Visit (INDEPENDENT_AMBULATORY_CARE_PROVIDER_SITE_OTHER): Payer: BC Managed Care – PPO

## 2015-01-22 ENCOUNTER — Ambulatory Visit (INDEPENDENT_AMBULATORY_CARE_PROVIDER_SITE_OTHER): Payer: BC Managed Care – PPO | Admitting: Podiatry

## 2015-01-22 ENCOUNTER — Encounter: Payer: Self-pay | Admitting: Podiatry

## 2015-01-22 VITALS — Temp 98.0°F

## 2015-01-22 DIAGNOSIS — M2041 Other hammer toe(s) (acquired), right foot: Secondary | ICD-10-CM

## 2015-01-22 DIAGNOSIS — Z9889 Other specified postprocedural states: Secondary | ICD-10-CM

## 2015-01-22 DIAGNOSIS — M79671 Pain in right foot: Secondary | ICD-10-CM

## 2015-01-23 ENCOUNTER — Other Ambulatory Visit: Payer: Self-pay

## 2015-01-27 NOTE — Progress Notes (Signed)
Subjective:     Patient ID: Katherine Lewis, female   DOB: 1970/07/28, 45 y.o.   MRN: [id number]  HPI patient states I'm doing well with my right foot with minimal discomfort   Review of Systems     Objective:   Physical Exam Neurovascular status intact muscle strength adequate and patient having well-healing digits fourth and fifth right and keratotic lesion inside fifth doing right with good alignment and wound edges well coapted    Assessment:     Doing well post surgery right    Plan:     Advised on physical therapy and at this time all stitches removed advised on wide to-type shoes continued open toed shoes and reappoint to recheck in the next several weeks or earlier if any issues should occur

## 2015-01-28 ENCOUNTER — Telehealth: Payer: Self-pay | Admitting: *Deleted

## 2015-01-28 NOTE — Telephone Encounter (Signed)
Pt states the skin on her little toe appears to be stretching away and pulling at the suture and oozing.  I told pt I would prefer she be seen in the office tomorrow and to rest the foot.  Transferred to schedulers to get in to be seen by Dr. Paulla Dolly 01/29/2015.

## 2015-01-29 ENCOUNTER — Ambulatory Visit (INDEPENDENT_AMBULATORY_CARE_PROVIDER_SITE_OTHER): Payer: BC Managed Care – PPO

## 2015-01-29 ENCOUNTER — Encounter: Payer: Self-pay | Admitting: Podiatry

## 2015-01-29 ENCOUNTER — Ambulatory Visit (INDEPENDENT_AMBULATORY_CARE_PROVIDER_SITE_OTHER): Payer: BC Managed Care – PPO | Admitting: Podiatry

## 2015-01-29 DIAGNOSIS — Z9889 Other specified postprocedural states: Secondary | ICD-10-CM | POA: Diagnosis not present

## 2015-01-29 DIAGNOSIS — L600 Ingrowing nail: Secondary | ICD-10-CM

## 2015-01-29 DIAGNOSIS — M2041 Other hammer toe(s) (acquired), right foot: Secondary | ICD-10-CM

## 2015-01-31 NOTE — Progress Notes (Signed)
Subjective:     Patient ID: Katherine Lewis, female   DOB: 1970-03-07, 45 y.o.   MRN: [id number]  HPI patient states I'm doing fine but I wanted to get my toe checked. Also my ingrown's are healing okay   Review of Systems     Objective:   Physical Exam  neurovascular status intact negative Homans sign noted with well coapted incision site right fifth digit and inner side right fifth toe with good alignment of the fifth toe and well-healed surgical sites big toenail bilateral with slight redness and cross but no proximal edema erythema drainage noted    Assessment:      patient's doing well with well-healing surgical sites and no current drainage or indications of infective process    Plan:      H&P x-ray of right foot reviewed with patient. Debrided tissue and went ahead and removed stitches with wound edges coapted well and advised on continued open toed shoes elevation and compression. Patient will be seen back in the next several weeks and was given strict instructions to call if any issues should occur

## 2015-02-24 ENCOUNTER — Ambulatory Visit (INDEPENDENT_AMBULATORY_CARE_PROVIDER_SITE_OTHER): Payer: BC Managed Care – PPO | Admitting: Podiatry

## 2015-02-24 ENCOUNTER — Ambulatory Visit (INDEPENDENT_AMBULATORY_CARE_PROVIDER_SITE_OTHER): Payer: BC Managed Care – PPO

## 2015-02-24 DIAGNOSIS — Z9889 Other specified postprocedural states: Secondary | ICD-10-CM

## 2015-02-24 DIAGNOSIS — M2041 Other hammer toe(s) (acquired), right foot: Secondary | ICD-10-CM

## 2015-02-24 DIAGNOSIS — L03031 Cellulitis of right toe: Secondary | ICD-10-CM

## 2015-02-24 MED ORDER — FLUCONAZOLE 150 MG PO TABS: 150.0000 mg | ORAL_TABLET | Freq: Once | ORAL | Status: DC

## 2015-02-24 MED ORDER — CEPHALEXIN 500 MG PO CAPS: 500.0000 mg | ORAL_CAPSULE | Freq: Three times a day (TID) | ORAL | Status: DC

## 2015-02-24 NOTE — Patient Instructions (Signed)

## 2015-02-25 NOTE — Progress Notes (Signed)
Patient ID: Katherine Lewis, female   DOB: 1970/04/08, 45 y.o.   MRN: [id number]  Subjective: Katherine Lewis is a 45 y.o. is seen today in office s/p right 4th and 5th toe repair preformed on 01/15/15 with Dr. Paulla Dolly.  She states that her right fifth toes become increasing swelling as well as redness and she has noticed a small amount of drainage coming from part of the incision. She denies any pus  But states it is more of a clear drainage. She has been wearing a regular shoe. Denies any systemic complaints such as fevers, chills, nausea, vomiting. No calf pain, chest pain, shortness of breath.   Objective: General: No acute distress, AAOx3  DP/PT pulses palpable 2/4, CRT < 3 sec to all digits.  Protective sensation intact. Motor function intact.  Right foot:  On the fourth of the incision is well coapted without any evidence of dehiscence and there is no clinical signs of infection. Is no edema, erythema, increase in warmth. On the fifth digit there does appear to be a small amount of drainage on the very distal part of the incision around the toenail and there  Does appear to be a small amount of drainage more from the  Medial nail border. There is edema and erythema to the fifth toe without any ascending  Saline is. There is mild increase in warmth. No other areas of tenderness to bilateral lower extremities.  No other open lesions or pre-ulcerative lesions.  No pain with calf compression, swelling, warmth, erythema.   Assessment and Plan:  Status post right 4th and 5th toe repair, with infection 5th toe  -Treatment options discussed including all alternatives, risks, and complications -X-rays were obtained and reviewed with the patient.  -Prescribed Keflex. -Epson salt soaks. -Antibiotic ointment and a bandage over one area daily. -I discussed with her possible fifth digit toenail removal /partial nail avulsion however she wishes or offer now. Discussed that if symptoms persist she may need to  have the nail removed. -Monitor for any clinical signs or symptoms of infection and DVT/PE and directed to call the office immediately should any occur or go to the ER. -Follow-up as scheduled or sooner if any problems arise. In the meantime, encouraged to call the office with any questions, concerns, change in symptoms.   Celesta Gentile, DPM

## 2015-03-17 ENCOUNTER — Ambulatory Visit: Payer: BC Managed Care – PPO | Admitting: Podiatry

## 2015-03-17 ENCOUNTER — Ambulatory Visit (INDEPENDENT_AMBULATORY_CARE_PROVIDER_SITE_OTHER): Payer: BC Managed Care – PPO

## 2015-03-17 ENCOUNTER — Ambulatory Visit (INDEPENDENT_AMBULATORY_CARE_PROVIDER_SITE_OTHER): Payer: BC Managed Care – PPO | Admitting: Podiatry

## 2015-03-17 ENCOUNTER — Encounter: Payer: Self-pay | Admitting: Podiatry

## 2015-03-17 DIAGNOSIS — Z9889 Other specified postprocedural states: Secondary | ICD-10-CM

## 2015-03-17 DIAGNOSIS — M2041 Other hammer toe(s) (acquired), right foot: Secondary | ICD-10-CM

## 2015-03-18 NOTE — Progress Notes (Signed)
Subjective:     Patient ID: Katherine Lewis, female   DOB: 08-01-1970, 45 y.o.   MRN: [id number]  HPI patient states my toes are doing a lot better with minimal discomfort swelling   Review of Systems     Objective:   Physical Exam Neurovascular status intact with good alignment of the fourth and fifth digits right with wound edges that are coapted well with mild edema fifth digit    Assessment:     Patient's doing well post arthroplasty digits 45 right    Plan:     Reviewed condition and reviewed x-rays and at this time gradual return to all types shoe gear. Reappoint as needed  X-ray report indicate satisfactory section had a proximal phalanx digits 4 and 5 right

## 2015-06-19 ENCOUNTER — Encounter: Payer: Self-pay | Admitting: Dietician

## 2015-06-19 ENCOUNTER — Encounter: Payer: BC Managed Care – PPO | Attending: Surgery | Admitting: Dietician

## 2015-06-19 DIAGNOSIS — Z09 Encounter for follow-up examination after completed treatment for conditions other than malignant neoplasm: Secondary | ICD-10-CM | POA: Insufficient documentation

## 2015-06-19 DIAGNOSIS — Z9884 Bariatric surgery status: Secondary | ICD-10-CM | POA: Insufficient documentation

## 2015-06-19 NOTE — Progress Notes (Signed)
  Follow-up visit:  14 months Post-Operative sleeve gastrectomy Surgery  Medical Nutrition Therapy:  Appt start time: 0800 end time:  0845.  Primary concerns today: Post-operative Bariatric Surgery Nutrition Management. Katherine Lewis returns today for nutrition follow up. She is frustrated with weight loss plateau and states, "I know what to do" but follow-through is difficult. Katherine Lewis states that she discussed Phentermine with Dr. Hassell Done but is concerned about the side effects. She states that vegetables cause cramping and diarrhea, no dumping symptoms. She is feeling "hungry all the time and can eat as much as I want." Has felt like she has been able to eat too much almost immediately post op. However, she reports she does not eat a lot at one time, feels like she is choosing the wrong types of foods. Craves sweets and breads. Tolerates protein foods well.     Foods not tolerated: lettuce, asparagus, and other fibrous vegetables (tolerates well-cooked, low fiber vegetables) Food dislikes: most vegetables  Starting weight: 261 lbs on 01/26/2014 Weight today: 223.3 lbs  Weight change: 37.7 lbs  Preferred Learning Style:   No preference indicated   Learning Readiness:  Ready  24-hr recall: Wakes up around 6:30am B (7:30-8 AM): 4 [employer] chicken minis and Diet Katherine Lewis Snk (AM): none  L (11:30 AM): burger with only part of the bun OR pizza OR hot dog Snk (PM): none  D (PM): chicken skewer and hummus OR [place] taco Snk (PM): occasionally [employer] or nuts or cheese and crackers  Fluid intake: Diet Katherine Lewis, wine, water  Estimated total protein intake: predicted sufficient protein intake as patient reports she is able to tolerate large amounts of food  Medications: see list Supplementation: taking multivitamin, not taking Calcium   Drinking while eating: yes Hair loss: yes, having some regrowth Carbonated beverages: yes N/V/D/C: some vomiting with too much volume in stomach Dumping  syndrome: none  Recent physical activity:  None, started BELT but was unable to continue due to work schedule  Progress Towards Goal(s):  In progress.  Handouts given during visit include:  Phase 3A lean proteins  Phase 3B lean proteins + non starchy vegetables  Bariatric fast food guide   Nutritional Diagnosis:  [place]-3.3 Overweight/obesity As related to history of inappropriate food and beverage choices, physical inactivity, and lack of adherence to nutrition recommendations following bariatric surgery as evidenced by BMI of 37.2 and patient report.    Intervention:  Nutrition counseling provided. Reviewed nutrition recommendations following bariatric surgery. Used motivational interviewing techniques to encourage realistic lifestyle goals. Goals: -Work on not eating out as much  -Be sure to have a snack if you are going to have a meal later than usual -Breakfast: try the low carb platter at [place] or [place] to avoid keeping trigger foods in the house -Look up "Mindless Eating" by Dia Crawford  -Increase physical activity  -Walking the dogs when you don't have an evening meeting -Reduce wine -Set goals for increasing 1. exercise and 2. reducing wine and 3. making a good food choice (make a visual chart with stickers - aim for 10 stickers a week!)  Teaching Method Utilized:  Visual Auditory  Barriers to learning/adherence to lifestyle change: work schedule, food preferences, ability to tolerate large amounts of food  Demonstrated degree of understanding via:  Teach Back   Monitoring/Evaluation:  Dietary intake, exercise, and body weight. Patient to call for follow up appointment at [facility].

## 2015-06-19 NOTE — Patient Instructions (Signed)
-  Work on not eating out as much   -Be sure to have a snack if you are going to have a meal later than usual  -Breakfast: try the low carb platter at [employer] or [place] to avoid keeping trigger foods in the house  -Look up "Mindless Eating" by Dia Crawford   -Increase physical activity  -Walking the dogs when you don't have an evening meeting -Reduce wine  -Set goals for increasing 1. exercise and 2. reducing wine and 3. making a good food choice (make a visual chart with stickers - aim for 10 stickers a week!)

## 2015-06-23 ENCOUNTER — Ambulatory Visit (INDEPENDENT_AMBULATORY_CARE_PROVIDER_SITE_OTHER): Payer: BC Managed Care – PPO | Admitting: Physician Assistant

## 2015-06-23 VITALS — BP 140/90 | HR 96 | Temp 98.5°F | Resp 18 | Ht 65.0 in | Wt 223.0 lb

## 2015-06-23 DIAGNOSIS — R197 Diarrhea, unspecified: Secondary | ICD-10-CM

## 2015-06-23 DIAGNOSIS — J069 Acute upper respiratory infection, unspecified: Secondary | ICD-10-CM

## 2015-06-23 DIAGNOSIS — A09 Infectious gastroenteritis and colitis, unspecified: Secondary | ICD-10-CM

## 2015-06-23 MED ORDER — IPRATROPIUM BROMIDE 0.03 % NA SOLN: 2.0000 | Freq: Two times a day (BID) | NASAL | Status: DC

## 2015-06-23 MED ORDER — GUAIFENESIN ER 1200 MG PO TB12: 1.0000 | ORAL_TABLET | Freq: Two times a day (BID) | ORAL | Status: DC | PRN

## 2015-06-23 MED ORDER — CETIRIZINE HCL 10 MG PO TABS: 10.0000 mg | ORAL_TABLET | Freq: Every day | ORAL | Status: DC

## 2015-06-23 NOTE — Progress Notes (Signed)
Patient ID: Katherine Lewis, female    DOB: 06/15/1970, 45 y.o.   MRN: [id number]  PCP: Vidal Schwalbe, MD  Subjective:   Chief Complaint  Patient presents with  . URI  . Sore Throat    HPI Presents for evaluation of illness x 4 days. Initially had a headache. Developed flushing, nausea and body "feels fuzzy, sore not not like flu achy."  Reports sore throat, ear pain, subjective fever. Also notes abdominal pain and diarrhea (6 episodes in the past 4 days). No vomiting. No cough. No GU symptoms.  Her partner has been ill for the past 6 weeks, reportedly with a virus, treated with supportive/symptomatic care.   Review of Systems As above.    Patient Active Problem List   Diagnosis Date Noted  . 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 Chronic cough 04/03/2012  . Right knee pain 06/11/2011     Prior to Admission medications   Medication Sig Start Date End Date Taking? Authorizing Provider  buPROPion (WELLBUTRIN XL) 150 MG 24 hr tablet Take 150 mg by mouth every morning. Reported on 06/23/2015   Yes Historical Provider, MD  cholecalciferol (VITAMIN D) 1000 UNITS tablet Take 1,000 Units by mouth daily. Reported on 06/23/2015   Yes Historical Provider, MD  escitalopram (LEXAPRO) 5 MG tablet Take 5 mg by mouth every morning. Reported on 06/23/2015   Yes Historical Provider, MD  levonorgestrel (MIRENA) 20 MCG/24HR IUD 1 each by Intrauterine route once. Reported on 06/23/2015   Yes Historical Provider, MD  cetirizine (ZYRTEC) 10 MG tablet Take 1 tablet (10 mg total) by mouth daily. Patient not taking: Reported on 06/23/2015 01/02/14   Araceli Bouche, PA  fluticasone (FLONASE) 50 MCG/ACT nasal spray Place 2 sprays into both nostrils daily. Patient not taking: Reported on 06/23/2015 01/02/14   Araceli Bouche, PA  losartan-hydrochlorothiazide (HYZAAR) 50-12.5 MG per tablet Take 1 tablet by mouth every evening. Reported on 06/23/2015    Historical Provider, MD  pantoprazole  (PROTONIX) 40 MG tablet TAKE 1 TABLET TWICE DAILY. Patient not taking: Reported on 06/23/2015 04/19/12   Kathee Delton, MD     Allergies  Allergen Reactions  . Codeine Nausea Only       Objective:  Physical Exam  Constitutional: She is oriented to person, place, and time. She appears well-developed and well-nourished. She is active and cooperative. No distress.  BP 140/90 mmHg  Pulse 96  Temp(Src) 98.5 F (36.9 C) (Oral)  Resp 18  Ht 5' 5"  (1.651 m)  Wt 223 lb (101.152 kg)  BMI 37.11 kg/m2  SpO2 97%  HENT:  Head: Normocephalic and atraumatic.  Right Ear: Hearing normal.  Left Ear: Hearing normal.  Eyes: Conjunctivae are normal. No scleral icterus.  Neck: Normal range of motion. Neck supple. No thyromegaly present.  Cardiovascular: Normal rate, regular rhythm and normal heart sounds.   Pulses:      Radial pulses are 2+ on the right side, and 2+ on the left side.  Pulmonary/Chest: Effort normal and breath sounds normal.  Abdominal: Soft. Bowel sounds are normal. She exhibits no distension and no mass. There is no tenderness. There is no rebound and no guarding.  Lymphadenopathy:       Head (right side): No tonsillar, no preauricular, no posterior auricular and no occipital adenopathy present.       Head (left side): No tonsillar, no preauricular, no posterior auricular and no occipital adenopathy present.    She has no cervical adenopathy.  Right: No supraclavicular adenopathy present.       Left: No supraclavicular adenopathy present.  Neurological: She is alert and oriented to person, place, and time. No sensory deficit.  Skin: Skin is warm, dry and intact. No rash noted. No cyanosis or erythema. Nails show no clubbing.  Psychiatric: She has a normal mood and affect. Her speech is normal and behavior is normal.           Assessment & Plan:   1. Acute upper respiratory infection 2. Diarrhea of presumed infectious origin Likely viral illness. Supportive care.  Rest, Hydration. RTC if symptoms worsen/persist. - cetirizine (ZYRTEC) 10 MG tablet; Take 1 tablet (10 mg total) by mouth daily.  Dispense: 30 tablet; Refill: 11 - Guaifenesin (MUCINEX MAXIMUM STRENGTH) 1200 MG TB12; Take 1 tablet (1,200 mg total) by mouth every 12 (twelve) hours as needed.  Dispense: 14 tablet; Refill: 1 - ipratropium (ATROVENT) 0.03 % nasal spray; Place 2 sprays into both nostrils 2 (two) times daily.  Dispense: 30 mL; Refill: 0    Fara Chute, PA-C Physician Assistant-Certified [facility]

## 2015-06-23 NOTE — Patient Instructions (Addendum)
Get plenty of rest and drink at least 64 ounces of water daily.     IF you received an x-ray today, you will receive an invoice from [facility]. Please contact [facility] at [phone] with questions or concerns regarding your invoice.   IF you received labwork today, you will receive an invoice from [employer]. Please contact [facility] at [phone] with questions or concerns regarding your invoice.   Our billing staff will not be able to assist you with questions regarding bills from these companies.  You will be contacted with the lab results as soon as they are available. The fastest way to get your results is to activate your My Chart account. Instructions are located on the last page of this paperwork. If you have not heard from [place] regarding the results in 2 weeks, please contact this office.    We recommend that you schedule a mammogram for breast cancer screening. Typically, you do not need a referral to do this. Please contact a local imaging center to schedule your mammogram.  [facility] - [phone]  *ask for the Radiology Department [facility] - [phone] or [phone]  [facility] - [phone] [place] [phone] [facility] - [phone]  *ask for the [facility] - [phone]  *ask for the Radiology Department [facility] - [phone]  *ask for the [facility] - [phone]

## 2015-06-23 NOTE — Progress Notes (Signed)
   Subjective:    Patient ID: Katherine Lewis, female    DOB: 1970/04/07, 45 y.o.   MRN: [id number] Chief Complaint  Patient presents with  . URI  . Sore Throat    HPI  Patient presents today with complaints of not feeling well for 4 days.  States "started with headache, then I felt flush, nauseated, and body feels fuzzy, sore but not like a flu achy."  Admits to sore throat, ear pain, tactile fever, chills, abdominal pain, diarrhea x6.    -Admits partner has had a "virus" for about 6 weeks and has received symptomatic treatment from provider.  Denies vomiting, constipaiton, history of migranes, cough, chest pain or change in urination.  Review of Systems All others negative except those listed in HPI.     Objective: BP 140/90 mmHg  Pulse 96  Temp(Src) 98.5 F (36.9 C) (Oral)  Resp 18  Ht 5' 5"  (1.651 m)  Wt 223 lb (101.152 kg)  BMI 37.11 kg/m2  SpO2 97%   Physical Exam  Constitutional: She appears well-developed and well-nourished.  Eyes: Pupils are equal, round, and reactive to light.       Assessment & Plan:  1. Acute upper respiratory infection - cetirizine (ZYRTEC) 10 MG tablet; Take 1 tablet (10 mg total) by mouth daily.  Dispense: 30 tablet; Refill: 11 - Guaifenesin (MUCINEX MAXIMUM STRENGTH) 1200 MG TB12; Take 1 tablet (1,200 mg total) by mouth every 12 (twelve) hours as needed.  Dispense: 14 tablet; Refill: 1 - ipratropium (ATROVENT) 0.03 % nasal spray; Place 2 sprays into both nostrils 2 (two) times daily.  Dispense: 30 mL; Refill: 0 2. Diarrhea of presumed infectious origin  Based on presenting symptoms, and history patient has a viral URI accompanied with GI upset.  Prescribed antihistamine to help dry out secretions, mucinex to help loosen and breakup mucus and nasal spray to widen nasal passages and facilitate drainage.  Discussed treatment with patient and advised to get plenty of rest and drink at least 64oz of water daily.  Patient advised if symptoms continue  or worsen return to clinic for evaluation.  Katherine Lewis P. Chrislyn Seedorf, PA-S

## 2015-08-09 LAB — RESULTS CONSOLE HPV: CHL HPV: NEGATIVE

## 2015-08-09 LAB — HM PAP SMEAR

## 2015-10-25 ENCOUNTER — Ambulatory Visit (INDEPENDENT_AMBULATORY_CARE_PROVIDER_SITE_OTHER): Payer: BC Managed Care – PPO | Admitting: Physician Assistant

## 2015-10-25 VITALS — BP 130/80 | HR 83 | Temp 98.5°F | Resp 17 | Ht 65.0 in | Wt 230.0 lb

## 2015-10-25 DIAGNOSIS — N898 Other specified noninflammatory disorders of vagina: Secondary | ICD-10-CM

## 2015-10-25 DIAGNOSIS — L298 Other pruritus: Secondary | ICD-10-CM | POA: Diagnosis not present

## 2015-10-25 LAB — POCT WET + KOH PREP
Trich by wet prep: ABSENT
YEAST BY KOH: ABSENT
YEAST BY WET PREP: ABSENT

## 2015-10-25 MED ORDER — FLUCONAZOLE 150 MG PO TABS: 150.0000 mg | ORAL_TABLET | Freq: Once | ORAL | 0 refills | Status: AC

## 2015-10-25 NOTE — Patient Instructions (Signed)
     IF you received an x-ray today, you will receive an invoice from [facility]. Please contact [facility] at [phone] with questions or concerns regarding your invoice.   IF you received labwork today, you will receive an invoice from [facility]. Please contact [facility] at [phone] with questions or concerns regarding your invoice.   Our billing staff will not be able to assist you with questions regarding bills from these companies.  You will be contacted with the lab results as soon as they are available. The fastest way to get your results is to activate your My Chart account. Instructions are located on the last page of this paperwork. If you have not heard from us regarding the results in 2 weeks, please contact this office.      

## 2015-10-25 NOTE — Progress Notes (Signed)
   Katherine Lewis  MRN: [id number] DOB: [birth year]  Subjective:  Pt presents to clinic with vaginal irritation and itching.  She used terazol but her symptoms did not get better.  She finished an abx 10 days ago and this is typical for her - in the past she has had to have a diflucan to make the infection go away.  Review of Systems  Genitourinary: Positive for vaginal discharge. Negative for dysuria.    Patient Active Problem List   Diagnosis Date Noted  . 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 Chronic cough 04/03/2012  . Right knee pain 06/11/2011    Current Outpatient Prescriptions on File Prior to Visit  Medication Sig Dispense Refill  . buPROPion (WELLBUTRIN XL) 150 MG 24 hr tablet Take 150 mg by mouth every morning. Reported on 06/23/2015    . cholecalciferol (VITAMIN D) 1000 UNITS tablet Take 1,000 Units by mouth daily. Reported on 06/23/2015    . escitalopram (LEXAPRO) 5 MG tablet Take 5 mg by mouth every morning. Reported on 06/23/2015    . levonorgestrel (MIRENA) 20 MCG/24HR IUD 1 each by Intrauterine route once. Reported on 06/23/2015     No current facility-administered medications on file prior to visit.     Allergies  Allergen Reactions  . Codeine Nausea Only    Pt patients past, family and social history were reviewed and updated.  Objective:  BP 130/80 (BP Location: Right Arm, Patient Position: Sitting, Cuff Size: Large)   Pulse 83   Temp 98.5 F (36.9 C) (Oral)   Resp 17   Ht 5' 5"  (1.651 m)   Wt 230 lb (104.3 kg)   SpO2 95%   BMI 38.27 kg/m   Physical Exam  Constitutional: She is oriented to person, place, and time and well-developed, well-nourished, and in no distress.  HENT:  Head: Normocephalic and atraumatic.  Right Ear: Hearing and external ear normal.  Left Ear: Hearing and external ear normal.  Eyes: Conjunctivae are normal.  Neck: Normal range of motion.  Pulmonary/Chest: Effort normal.  Neurological: She is alert and  oriented to person, place, and time. Gait normal.  Skin: Skin is warm and dry.  Psychiatric: Mood, memory, affect and judgment normal.  Vitals reviewed.  Results for orders placed or performed in visit on 10/25/15  POCT Wet + KOH Prep  Result Value Ref Range   Yeast by KOH Absent Present, Absent   Yeast by wet prep Absent Present, Absent   WBC by wet prep Few None, Few, Too numerous to count   Clue Cells Wet Prep HPF POC None None, Too numerous to count   Trich by wet prep Absent Present, Absent   Bacteria Wet Prep HPF POC Moderate (A) None, Few, Too numerous to count   Epithelial Cells By [employer] ([facility]) Many (A) None, Few, Too numerous to count   RBC,UR,HPF,POC None None RBC/hpf    Assessment and Plan :  Vaginal itching - Plan: POCT Wet + KOH Prep, fluconazole (DIFLUCAN) 150 MG tablet  Pt has continued symptoms even after treatment of terazol after abx use - we will go ahead and treat due to her history of having problems with irritation until she has diflucan.  Windell Hummingbird PA-C  [facility] 10/25/2015 11:20 AM

## 2016-01-19 IMAGING — RF DG UGI W/ KUB
13 series · 13 of 13 positions shown · non-contrast
Comparison: None.

CLINICAL DATA: Bariatric screening

EXAM:
UPPER GI SERIES WITH KUB
TECHNIQUE: After obtaining a scout radiograph a routine upper GI series was
performed using thin barium
FLUOROSCOPY TIME:  Fluoroscopy Time (in minutes and seconds): 48
seconds
Number of Acquired Images:  13

[Series 1: run · 1 of 1 slices shown (1 of 11)]
[im 1/1]
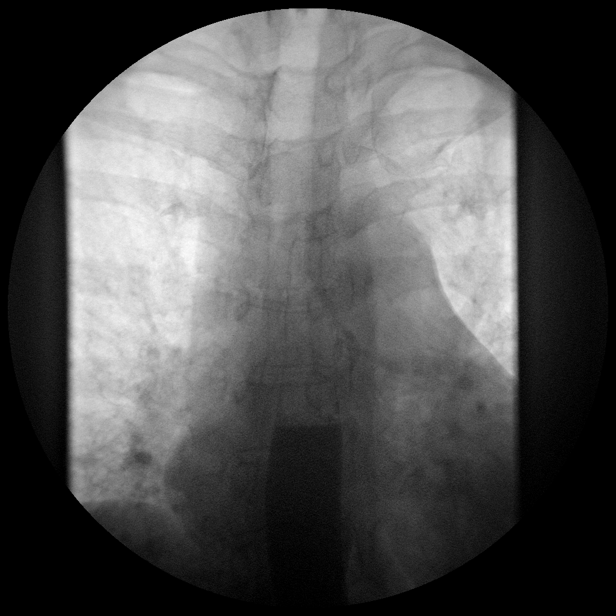

[Series 1: abdomen kub · 0.14mm/px · 1 of 1 slices shown (1 of 2)]
[im 1/1]
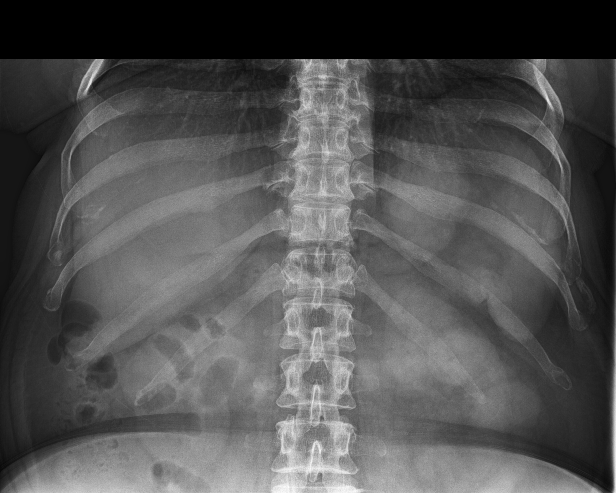

[Series 2: run · 1 of 1 slices shown (2 of 11)]
[im 1/1]
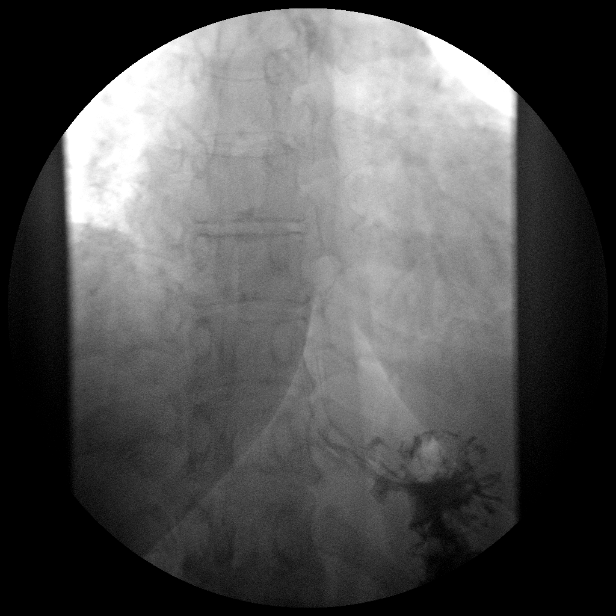

[Series 2: abdomen kub · 0.14mm/px · 1 of 1 slices shown (2 of 2)]
[im 1/1]
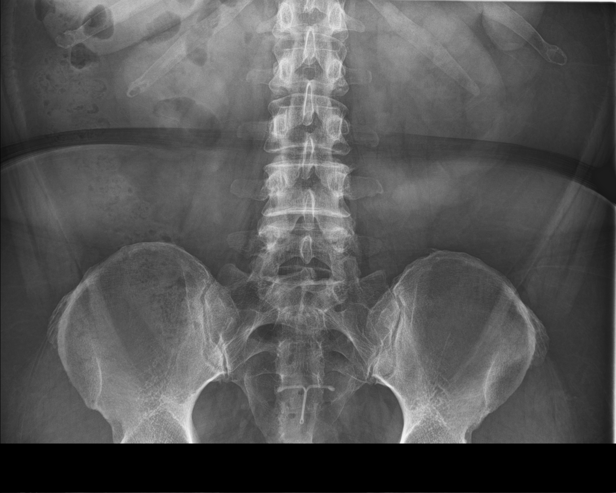

[Series 3: run · 1 of 1 slices shown (3 of 11)]
[im 1/1]
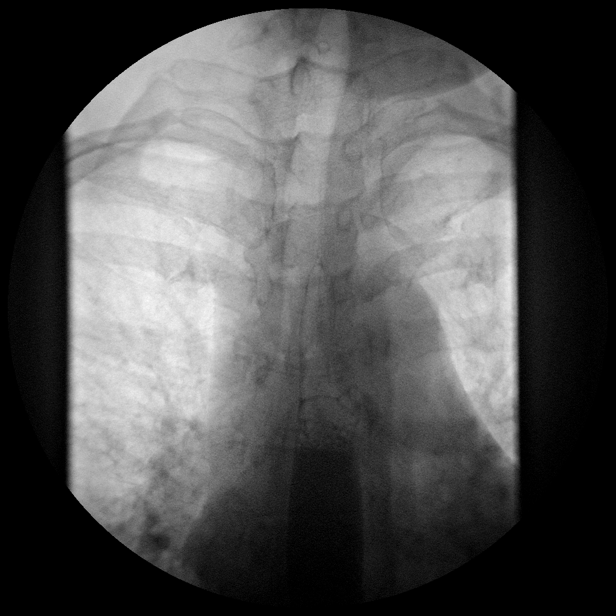

[Series 4: run · 1 of 1 slices shown (4 of 11)]
[im 1/1]
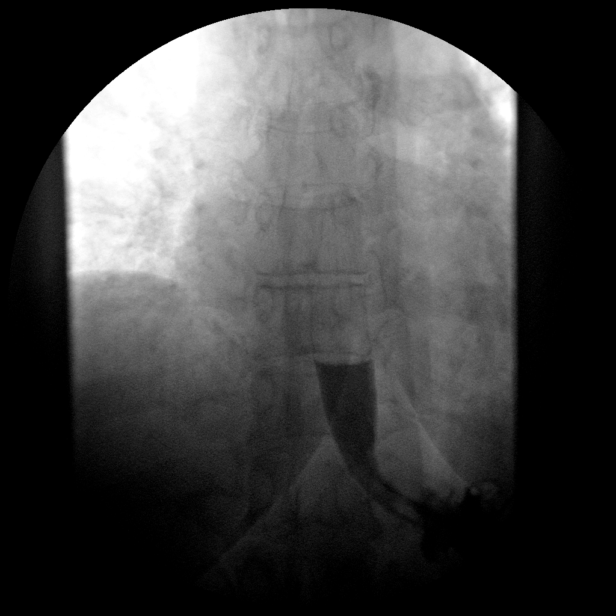

[Series 5: run · 1 of 1 slices shown (5 of 11)]
[im 1/1]
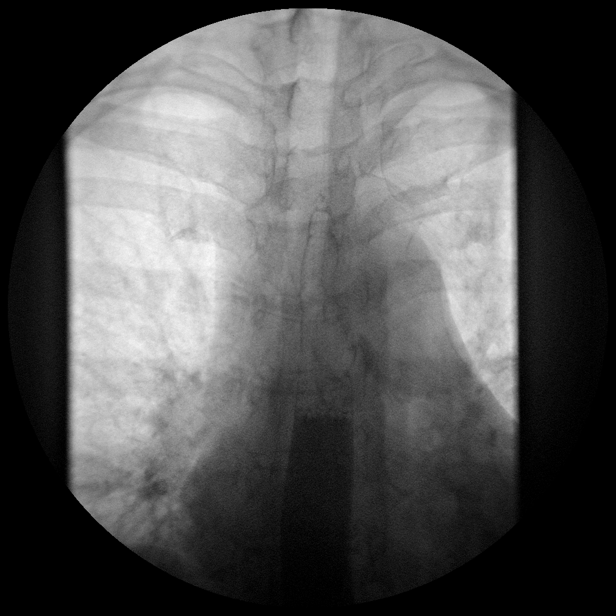

[Series 6: run · 1 of 1 slices shown (6 of 11)]
[im 1/1]
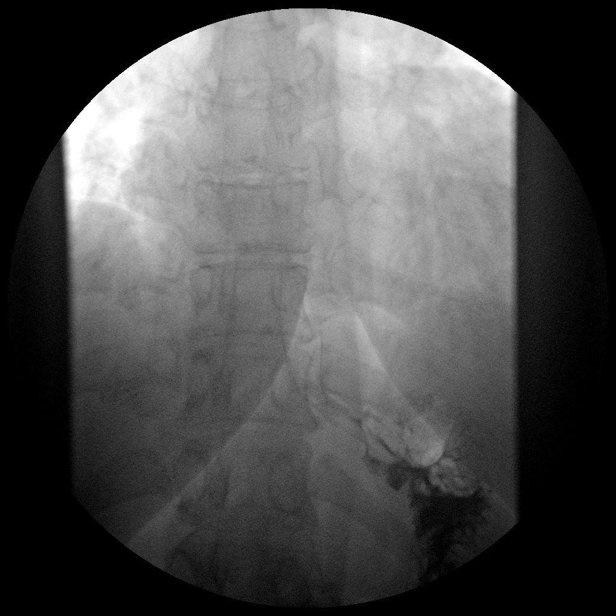

[Series 7: run · 1 of 1 slices shown (7 of 11)]
[im 1/1]
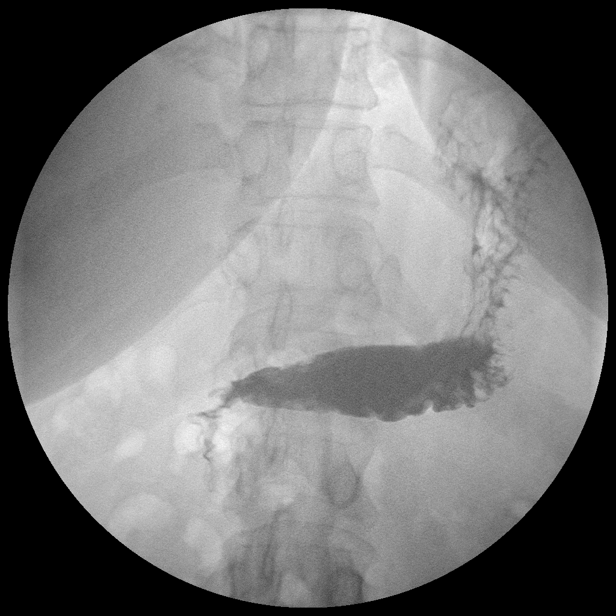

[Series 8: run · 1 of 1 slices shown (8 of 11)]
[im 1/1]
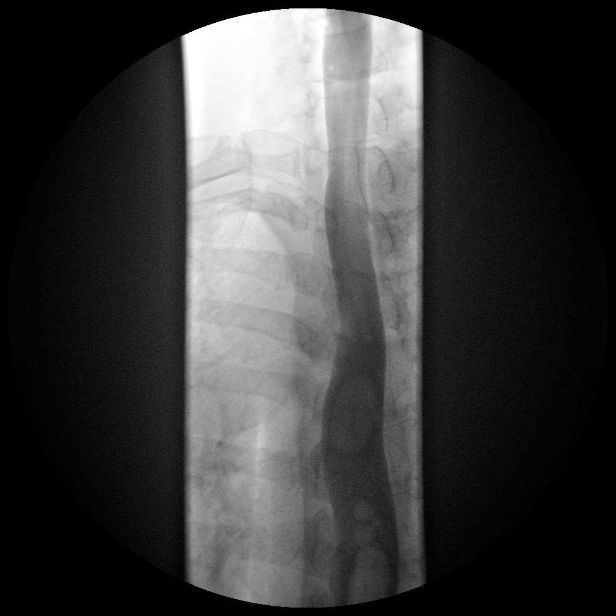

[Series 9: run · 1 of 1 slices shown (9 of 11)]
[im 1/1]
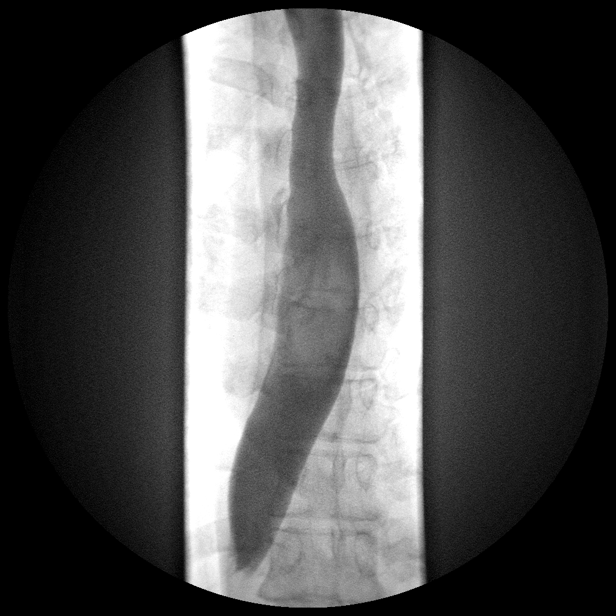

[Series 10: run · 1 of 1 slices shown (10 of 11)]
[im 1/1]
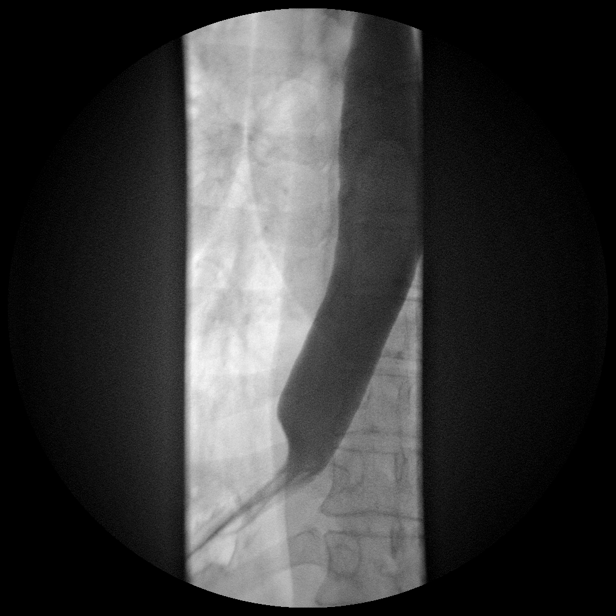

[Series 11: run · 1 of 1 slices shown (11 of 11)]
[im 1/1]
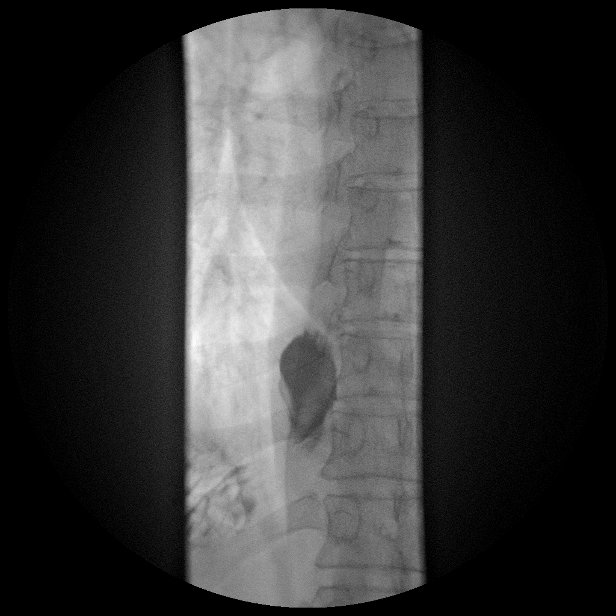

[13 of 13 positions shown; findings below may reference images not displayed]

FINDINGS: Normal scout radiographs.

Normal esophageal peristalsis. No fixed esophageal narrowing or
stricture.

No gastroesophageal reflux.

Normal gastric folds.
IMPRESSION: Negative upper GI.

## 2016-05-27 ENCOUNTER — Encounter: Payer: Self-pay | Admitting: Physician Assistant

## 2016-05-27 ENCOUNTER — Ambulatory Visit (INDEPENDENT_AMBULATORY_CARE_PROVIDER_SITE_OTHER): Payer: BC Managed Care – PPO | Admitting: Physician Assistant

## 2016-05-27 VITALS — BP 121/90 | HR 75 | Temp 98.6°F | Resp 17 | Ht 65.0 in | Wt 218.0 lb

## 2016-05-27 DIAGNOSIS — J069 Acute upper respiratory infection, unspecified: Secondary | ICD-10-CM

## 2016-05-27 DIAGNOSIS — B9789 Other viral agents as the cause of diseases classified elsewhere: Secondary | ICD-10-CM

## 2016-05-27 MED ORDER — OSELTAMIVIR PHOSPHATE 75 MG PO CAPS: 75.0000 mg | ORAL_CAPSULE | Freq: Two times a day (BID) | ORAL | 0 refills | Status: DC

## 2016-05-27 MED ORDER — AZELASTINE HCL 0.1 % NA SOLN: 2.0000 | Freq: Two times a day (BID) | NASAL | 0 refills | Status: DC

## 2016-05-27 MED ORDER — BENZONATATE 100 MG PO CAPS: 100.0000 mg | ORAL_CAPSULE | Freq: Three times a day (TID) | ORAL | 0 refills | Status: DC | PRN

## 2016-05-27 MED ORDER — GUAIFENESIN ER 1200 MG PO TB12: 1.0000 | ORAL_TABLET | Freq: Two times a day (BID) | ORAL | 1 refills | Status: DC | PRN

## 2016-05-27 NOTE — Patient Instructions (Addendum)
IF you received an x-ray today, you will receive an invoice from [facility]. Please contact [facility] at [phone] with questions or concerns regarding your invoice.   IF you received labwork today, you will receive an invoice from [facility]. Please contact [facility] at [phone] with questions or concerns regarding your invoice.   Our billing staff will not be able to assist you with questions regarding bills from these companies.  You will be contacted with the lab results as soon as they are available. The fastest way to get your results is to activate your My Chart account. Instructions are located on the last page of this paperwork. If you have not heard from [place] regarding the results in 2 weeks, please contact this office.     Influenza, Adult Influenza, more commonly known as "the flu," is a viral infection that primarily affects the respiratory tract. The respiratory tract includes organs that help you breathe, such as the lungs, nose, and throat. The flu causes many common cold symptoms, as well as a high fever and body aches. The flu spreads easily from person to person (is contagious). Getting a flu shot (influenza vaccination) every year is the best way to prevent influenza. What are the causes? Influenza is caused by a virus. You can catch the virus by:  Breathing in droplets from an infected person's cough or sneeze.  Touching something that was recently contaminated with the virus and then touching your mouth, nose, or eyes. What increases the risk? The following factors may make you more likely to get the flu:  Not cleaning your hands frequently with soap and water or alcohol-based hand sanitizer.  Having close contact with many people during cold and flu season.  Touching your mouth, eyes, or nose without washing or sanitizing your hands first.  Not drinking enough fluids or not eating a healthy diet.  Not getting enough sleep or  exercise.  Being under a high amount of stress.  Not getting a yearly (annual) flu shot. You may be at a higher risk of complications from the flu, such as a severe lung infection (pneumonia), if you:  Are over the age of 70.  Are pregnant.  Have a weakened disease-fighting system (immune system). You may have a weakened immune system if you:  Have HIV or AIDS.  Are undergoing chemotherapy.  Aretaking medicines that reduce the activity of (suppress) the immune system.  Have a long-term (chronic) illness, such as heart disease, kidney disease, diabetes, or lung disease.  Have a liver disorder.  Are obese.  Have anemia. What are the signs or symptoms? Symptoms of this condition typically last 4-10 days and may include:  Fever.  Chills.  Headache, body aches, or muscle aches.  Sore throat.  Cough.  Runny or congested nose.  Chest discomfort and cough.  Poor appetite.  Weakness or tiredness (fatigue).  Dizziness.  Nausea or vomiting. How is this diagnosed? This condition may be diagnosed based on your medical history and a physical exam. Your health care provider may do a nose or throat swab test to confirm the diagnosis. How is this treated? If influenza is detected early, you can be treated with antiviral medicine that can reduce the length of your illness and the severity of your symptoms. This medicine may be given by mouth (orally) or through an IV tube that is inserted in one of your veins. The goal of treatment is to relieve symptoms by taking care of yourself at home.  This may include taking over-the-counter medicines, drinking plenty of fluids, and adding humidity to the air in your home. In some cases, influenza goes away on its own. Severe influenza or complications from influenza may be treated in a hospital. Follow these instructions at home:  Take over-the-counter and prescription medicines only as told by your health care provider.  Use a cool  mist humidifier to add humidity to the air in your home. This can make breathing easier.  Rest as needed.  Drink enough fluid to keep your urine clear or pale yellow.  Cover your mouth and nose when you cough or sneeze.  Wash your hands with soap and water often, especially after you cough or sneeze. If soap and water are not available, use hand sanitizer.  Stay home from work or school as told by your health care provider. Unless you are visiting your health care provider, try to avoid leaving home until your fever has been gone for 24 hours without the use of medicine.  Keep all follow-up visits as told by your health care provider. This is important. How is this prevented?  Getting an annual flu shot is the best way to avoid getting the flu. You may get the flu shot in late summer, fall, or winter. Ask your health care provider when you should get your flu shot.  Wash your hands often or use hand sanitizer often.  Avoid contact with people who are sick during cold and flu season.  Eat a healthy diet, drink plenty of fluids, get enough sleep, and exercise regularly. Contact a health care provider if:  You develop new symptoms.  You have:  Chest pain.  Diarrhea.  A fever.  Your cough gets worse.  You produce more mucus.  You feel nauseous or you vomit. Get help right away if:  You develop shortness of breath or difficulty breathing.  Your skin or nails turn a bluish color.  You have severe pain or stiffness in your neck.  You develop a sudden headache or sudden pain in your face or ear.  You cannot stop vomiting. This information is not intended to replace advice given to you by your health care provider. Make sure you discuss any questions you have with your health care provider. Document Released: 01/02/2000 Document Revised: 06/12/2015 Document Reviewed: 10/29/2014 Elsevier Interactive Patient Education  2017 [employer].

## 2016-05-27 NOTE — Progress Notes (Signed)
Patient ID: Katherine Lewis, female    DOB: April 06, 1970, 46 y.o.   MRN: [id number]  PCP: Harlan Stains, MD  Chief Complaint  Patient presents with  . Generalized Body Aches    onset yesterday  . Ear Pain  . Cough  . Sinusitis    Subjective:   Presents for evaluation of body aches, ear pain, cough and sinus congestion.  Sudden onset of symptoms yesterday. "Junk in the back of my throat." Ears hurting. Went to bed early, but awoke at midnight feeling worse, coughing. Unable to go back to sleep. Feels achy all over.  Non-productive cough. No fever, chills. Nausea. Diarrhea intermittently x 1 week, thought it was "just something I ate." Normal today. No melena, hematochezia or mucous in the stool.  Acetaminophen has been moderately effective for her new symptoms. Takes fexofenadine daily for allergies, seems less effective this week than usual.  Received a flu vaccine this season. No known sick contacts.  Review of Systems As above.    Patient Active Problem List   Diagnosis Date Noted  . 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 Chronic cough 04/03/2012  . Right knee pain 06/11/2011     Prior to Admission medications   Medication Sig Start Date End Date Taking? Authorizing Provider  buPROPion (WELLBUTRIN XL) 150 MG 24 hr tablet Take 150 mg by mouth every morning. Reported on 06/23/2015   Yes [provider]  cholecalciferol (VITAMIN D) 1000 UNITS tablet Take 1,000 Units by mouth daily. Reported on 06/23/2015   Yes [provider]  escitalopram (LEXAPRO) 5 MG tablet Take 5 mg by mouth every morning. Reported on 06/23/2015   Yes [provider]  levonorgestrel (MIRENA) 20 MCG/24HR IUD 1 each by Intrauterine route once. Reported on 06/23/2015   Yes [provider]  losartan-hydrochlorothiazide Konrad Penta) 50-12.5 MG tablet Takes 1/2 pill daily 04/02/16  Yes [provider]     Allergies  Allergen Reactions    . Codeine Nausea Only       Objective:  Physical Exam  Constitutional: She is oriented to person, place, and time. She appears well-developed and well-nourished. No distress.  BP 121/90 (BP Location: Right Arm, Patient Position: Sitting, Cuff Size: Large)   Pulse 75   Temp 98.6 F (37 C) (Oral)   Resp 17   Ht 5' 5"  (1.651 m)   Wt 218 lb (98.9 kg)   SpO2 96%   BMI 36.28 kg/m    HENT:  Head: Normocephalic and atraumatic.  Right Ear: Hearing, tympanic membrane, external ear and ear canal normal.  Left Ear: Hearing, tympanic membrane, external ear and ear canal normal.  Nose: Mucosal edema and rhinorrhea present.  No foreign bodies. Right sinus exhibits no maxillary sinus tenderness and no frontal sinus tenderness. Left sinus exhibits no maxillary sinus tenderness and no frontal sinus tenderness.  Mouth/Throat: Uvula is midline, oropharynx is clear and moist and mucous membranes are normal. No uvula swelling. No oropharyngeal exudate.  Eyes: Conjunctivae and EOM are normal. Pupils are equal, round, and reactive to light. Right eye exhibits no discharge. Left eye exhibits no discharge. No scleral icterus.  Neck: Trachea normal, normal range of motion and full passive range of motion without pain. Neck supple. No thyroid mass and no thyromegaly present.  Cardiovascular: Normal rate, regular rhythm and normal heart sounds.   Pulmonary/Chest: Effort normal and breath sounds normal.  Lymphadenopathy:       Head (right side): No submandibular, no tonsillar,  no preauricular, no posterior auricular and no occipital adenopathy present.       Head (left side): No submandibular, no tonsillar, no preauricular and no occipital adenopathy present.    She has no cervical adenopathy.       Right: No supraclavicular adenopathy present.       Left: No supraclavicular adenopathy present.  Neurological: She is alert and oriented to person, place, and time. She has normal strength. No cranial nerve  deficit or sensory deficit.  Skin: Skin is warm, dry and intact. No rash noted.  Psychiatric: She has a normal mood and affect. Her speech is normal and behavior is normal.           Assessment & Plan:   1. Viral URI with cough Suspect influenza. Treat accordingly. Supportive care.  Anticipatory guidance.  RTC if symptoms worsen/persist. Work note provided. - azelastine (ASTELIN) 0.1 % nasal spray; Place 2 sprays into both nostrils 2 (two) times daily. Use in each nostril as directed  Dispense: 30 mL; Refill: 0 - Guaifenesin (MUCINEX MAXIMUM STRENGTH) 1200 MG TB12; Take 1 tablet (1,200 mg total) by mouth every 12 (twelve) hours as needed.  Dispense: 14 tablet; Refill: 1 - benzonatate (TESSALON) 100 MG capsule; Take 1-2 capsules (100-200 mg total) by mouth 3 (three) times daily as needed for cough.  Dispense: 40 capsule; Refill: 0 - oseltamivir (TAMIFLU) 75 MG capsule; Take 1 capsule (75 mg total) by mouth 2 (two) times daily.  Dispense: 10 capsule; Refill: 0    Return if symptoms worsen or fail to improve.   Fara Chute, PA-C [facility] [place]

## 2016-07-19 ENCOUNTER — Ambulatory Visit (INDEPENDENT_AMBULATORY_CARE_PROVIDER_SITE_OTHER): Payer: BC Managed Care – PPO | Admitting: Family Medicine

## 2016-07-19 ENCOUNTER — Encounter: Payer: Self-pay | Admitting: Family Medicine

## 2016-07-19 VITALS — BP 124/82 | HR 83 | Ht 64.5 in | Wt 217.4 lb

## 2016-07-19 DIAGNOSIS — Z9884 Bariatric surgery status: Secondary | ICD-10-CM

## 2016-07-19 DIAGNOSIS — F329 Major depressive disorder, single episode, unspecified: Secondary | ICD-10-CM

## 2016-07-19 DIAGNOSIS — J301 Allergic rhinitis due to pollen: Secondary | ICD-10-CM | POA: Diagnosis not present

## 2016-07-19 DIAGNOSIS — I10 Essential (primary) hypertension: Secondary | ICD-10-CM | POA: Diagnosis not present

## 2016-07-19 DIAGNOSIS — Z833 Family history of diabetes mellitus: Secondary | ICD-10-CM

## 2016-07-19 DIAGNOSIS — G4733 Obstructive sleep apnea (adult) (pediatric): Secondary | ICD-10-CM

## 2016-07-19 DIAGNOSIS — F32A Depression, unspecified: Secondary | ICD-10-CM

## 2016-07-19 DIAGNOSIS — E669 Obesity, unspecified: Secondary | ICD-10-CM | POA: Diagnosis not present

## 2016-07-19 NOTE — Progress Notes (Signed)
   Subjective:    Patient ID: Katherine Lewis, female    DOB: Jun 19, 1970, 46 y.o.   MRN: [id number]  HPI She is here for consult concerning risk for diabetes. She does have a family history of diabetes with her mother being a type II diabetic. She also has had a gastric sleeve and is down to a size 16 from size 20. She is concerned about her symptoms of occasional dizziness, frequency, and thirsty and risk for diabetes. She also has a history of OSA however did lose weight and since then has not had any excessive daytime sleepiness, fatigue etc. She is in the process of switching jobs which will allow for more physical activity. Her allergies seem to be under good control. She is also on losartan/HCTZ for her hypertension. She has been on Wellbutrin/Lexapro for several years and apparently did try stopping this without much success. She is interested in potentially stopping this sometime after her new job is much more settled.   Review of Systems     Objective:   Physical Exam Alert and in no distress. She is roughly 2-1/2 hours ago and her blood sugars 122. A1c today is 4.9.       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 Did discuss diet with her. She has been through the program for her gastric sleeve and plans to get out the information concerning this and start eating a more nutritionally solid meal program. Also encouraged her to start an exercise program which she plans to do on a daily basis. She has a goal of getting down to a size 10. I will essentially have her come back in roughly 2 months for a recheck to see how she is doing.

## 2016-07-20 ENCOUNTER — Telehealth: Payer: Self-pay

## 2016-07-20 NOTE — Telephone Encounter (Signed)
Records from [facility] placed in your folder for review. Katherine Lewis

## 2016-07-27 ENCOUNTER — Encounter: Payer: Self-pay | Admitting: Family Medicine

## 2016-08-06 ENCOUNTER — Encounter: Payer: Self-pay | Admitting: Family Medicine

## 2016-08-06 MED ORDER — ESCITALOPRAM OXALATE 5 MG PO TABS: 5.0000 mg | ORAL_TABLET | Freq: Every morning | ORAL | 1 refills | Status: DC

## 2016-09-27 ENCOUNTER — Ambulatory Visit: Payer: BC Managed Care – PPO | Admitting: Family Medicine

## 2016-10-11 ENCOUNTER — Encounter: Payer: Self-pay | Admitting: Family Medicine

## 2016-10-11 ENCOUNTER — Ambulatory Visit (INDEPENDENT_AMBULATORY_CARE_PROVIDER_SITE_OTHER): Payer: BLUE CROSS/BLUE SHIELD | Admitting: Family Medicine

## 2016-10-11 VITALS — BP 120/70 | HR 89 | Resp 16 | Wt 227.8 lb

## 2016-10-11 DIAGNOSIS — F329 Major depressive disorder, single episode, unspecified: Secondary | ICD-10-CM

## 2016-10-11 DIAGNOSIS — Z9884 Bariatric surgery status: Secondary | ICD-10-CM | POA: Diagnosis not present

## 2016-10-11 DIAGNOSIS — E669 Obesity, unspecified: Secondary | ICD-10-CM

## 2016-10-11 DIAGNOSIS — F32A Depression, unspecified: Secondary | ICD-10-CM

## 2016-10-11 MED ORDER — NALTREXONE-BUPROPION HCL ER 8-90 MG PO TB12: ORAL_TABLET | ORAL | 0 refills | Status: DC

## 2016-10-11 NOTE — Progress Notes (Signed)
   Subjective:    Patient ID: Katherine Lewis, female    DOB: 1970/10/16, 46 y.o.   MRN: [id number]  HPI She is here for consult concerning weight loss. She does have a gastric sleeve states that she constantly feels hungry. She has been involved in nutritional counseling in the past and is considering doing this again. She also admits to drinking 4 glasses of wine per night. She is walking daily with her new job at [employer]. Presently she is also taking bupropion.   Review of Systems     Objective:   Physical Exam Alert and in no distress. Blood pressure is recorded.       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 A discounted card was given for contrave. She will use it according to the manufacturer's recommendations. Discussed possible side effects. She will keep in touch with me concerning this. Also encouraged her to cut out all alcohol. Recommend she make a game out of this with her partner concerning alcohol consumption with each of them throwing $10 daily in and the looser keeping it or the money has to be given way to another undesirable cause if they both fail. Recheck here 1 month. If she has difficulty getting that medication, she will let me know and we will readjust. Recheck here 1 month

## 2016-10-17 ENCOUNTER — Telehealth: Payer: Self-pay | Admitting: Family Medicine

## 2016-10-17 NOTE — Telephone Encounter (Signed)
P.A. CONTRAVE

## 2016-10-24 NOTE — Telephone Encounter (Signed)
P.A. CONTRAVE Approved til 04/14/17

## 2016-11-01 NOTE — Telephone Encounter (Signed)
Left message for pt, faxed pharmacy

## 2016-11-22 ENCOUNTER — Encounter (HOSPITAL_COMMUNITY): Payer: Self-pay

## 2016-12-07 ENCOUNTER — Other Ambulatory Visit: Payer: Self-pay | Admitting: Family Medicine

## 2016-12-30 ENCOUNTER — Telehealth: Payer: Self-pay | Admitting: Family Medicine

## 2016-12-30 MED ORDER — BUPROPION HCL ER (XL) 150 MG PO TB24: 150.0000 mg | ORAL_TABLET | Freq: Every morning | ORAL | 0 refills | Status: DC

## 2016-12-30 NOTE — Telephone Encounter (Signed)
Pt called requesting refill on Bupropion 150 mg

## 2017-04-10 ENCOUNTER — Other Ambulatory Visit: Payer: Self-pay | Admitting: Family Medicine

## 2017-04-11 NOTE — Telephone Encounter (Signed)
Please advise if wellbutrin could be filled at [facility]. Thanks [employer]

## 2017-05-29 ENCOUNTER — Other Ambulatory Visit: Payer: Self-pay | Admitting: Family Medicine

## 2017-05-30 ENCOUNTER — Telehealth: Payer: Self-pay

## 2017-05-30 NOTE — Telephone Encounter (Signed)
Have her set up a med check appointment

## 2017-05-30 NOTE — Telephone Encounter (Signed)
[facility] wants to fill pt lexapro. Please advise [facility]

## 2017-05-30 NOTE — Telephone Encounter (Signed)
Pt was called and LVM for pt to call back and make med check appt. [place]

## 2017-05-30 NOTE — Telephone Encounter (Signed)
Patient called to schedule appointment for Med check on 06-16-17 at 3 pm with Dr Redmond School.  Patient needs her Lexapro refilled to [facility]

## 2017-06-16 ENCOUNTER — Encounter: Payer: Self-pay | Admitting: Family Medicine

## 2017-06-16 ENCOUNTER — Ambulatory Visit: Payer: BLUE CROSS/BLUE SHIELD | Admitting: Family Medicine

## 2017-06-16 VITALS — BP 128/84 | HR 75 | Temp 98.2°F | Ht 64.0 in | Wt 237.2 lb

## 2017-06-16 DIAGNOSIS — F32A Depression, unspecified: Secondary | ICD-10-CM

## 2017-06-16 DIAGNOSIS — Z9884 Bariatric surgery status: Secondary | ICD-10-CM | POA: Diagnosis not present

## 2017-06-16 DIAGNOSIS — F329 Major depressive disorder, single episode, unspecified: Secondary | ICD-10-CM | POA: Diagnosis not present

## 2017-06-16 DIAGNOSIS — E669 Obesity, unspecified: Secondary | ICD-10-CM | POA: Diagnosis not present

## 2017-06-16 NOTE — Patient Instructions (Addendum)
You can do the 10,000 steps.  Also just 20 minutes of something physical daily Take 75 mg of Wellbutrin for the next month and then stop call me and we will talk  Try Prilosec

## 2017-06-16 NOTE — Progress Notes (Signed)
   Subjective:    Patient ID: Katherine Lewis, female    DOB: February 11, 1970, 47 y.o.   MRN: [id number]  HPI She is here for recheck visit. since last being seen she has had continued difficulty with her weight.  She does have a previous history of laparoscopic sleeve.  So far she has been unable to make the changes necessary to lose more weight. Psychologically she is doing quite well.  The stresses that she was under when she needed the antidepressants are now gone.  Her home life is going quite well.  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  She is interested in possibly stopping her medication.   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  If she is doing well at that point, I will then have her stop her Lexapro.  Her present dosing that is 5 mg. I then discussed her weight.  I think it is reasonable to have her return to see Dr. Hassell Done who did the original surgery and talk about options with him.  She is comfortable with that.

## 2017-06-30 ENCOUNTER — Telehealth: Payer: Self-pay

## 2017-06-30 NOTE — Telephone Encounter (Signed)
Called pt advise pf appt in Dr. Earlie Server office with his PA due to him being booked out to August. The appt is 07-07-17 at 9am and their office number was given in case she needed to change the date . [place]

## 2017-07-25 ENCOUNTER — Other Ambulatory Visit: Payer: Self-pay | Admitting: Family Medicine

## 2017-10-04 ENCOUNTER — Ambulatory Visit (INDEPENDENT_AMBULATORY_CARE_PROVIDER_SITE_OTHER): Payer: PRIVATE HEALTH INSURANCE | Admitting: Family Medicine

## 2017-10-04 ENCOUNTER — Ambulatory Visit
Admission: RE | Admit: 2017-10-04 | Discharge: 2017-10-04 | Disposition: A | Discharge status: Home / Self Care | Payer: BC Managed Care – PPO | Source: Ambulatory Visit | Attending: Family Medicine | Admitting: Family Medicine

## 2017-10-04 ENCOUNTER — Encounter: Payer: Self-pay | Admitting: Family Medicine

## 2017-10-04 VITALS — BP 118/70 | HR 88 | Temp 98.4°F | Wt 237.6 lb

## 2017-10-04 DIAGNOSIS — F32A Depression, unspecified: Secondary | ICD-10-CM

## 2017-10-04 DIAGNOSIS — Z23 Encounter for immunization: Secondary | ICD-10-CM

## 2017-10-04 DIAGNOSIS — M25561 Pain in right knee: Secondary | ICD-10-CM

## 2017-10-04 DIAGNOSIS — F329 Major depressive disorder, single episode, unspecified: Secondary | ICD-10-CM | POA: Diagnosis not present

## 2017-10-04 MED ORDER — ESCITALOPRAM OXALATE 5 MG PO TABS: 5.0000 mg | ORAL_TABLET | Freq: Every day | ORAL | 3 refills | Status: DC

## 2017-10-04 NOTE — Progress Notes (Signed)
   Subjective:    Patient ID: Katherine Lewis, female    DOB: April 03, 1970, 47 y.o.   MRN: [id number]  HPI She is here for evaluation of a 15-monthhistory of right medial knee pain.  She has a previous history of difficulty with this approximately 6 years ago.  MRI did indicate subchondral stress fracture and possible osteonecrosis.  Apparently she had done fairly well until 6 months ago.  No history of overuse or injury. Also she recently tapered off her psychotropic medications and did fairly well for 1 month but is now noticing increased anxiety and irritability.  She would like to get back on her medication.   Review of Systems     Objective:   Physical Exam Alert and in no distress.  Exam of the right knee shows some slight discomfort with McMurray's testing medially.  No joint effusion.  Negative anterior drawer.       Assessment & Plan:  Right knee pain, unspecified chronicity - Plan: DG Knee Complete 4 Views Right  Need for influenza vaccination - Plan: CANCELED: Flu Vaccine QUAD 6+ mos PF IM (Fluarix Quad PF)  Depression, unspecified depression type - Plan: escitalopram (LEXAPRO) 5 MG tablet I will place her back on Lexapro.  She will keep me informed as to how she is doing. I then discussed the knee pain.  We will start initially with an x-ray and may need to then to go to an MRI if she continues have difficulty with this.

## 2017-10-07 ENCOUNTER — Ambulatory Visit (INDEPENDENT_AMBULATORY_CARE_PROVIDER_SITE_OTHER): Payer: PRIVATE HEALTH INSURANCE | Admitting: Family Medicine

## 2017-10-07 ENCOUNTER — Encounter: Payer: Self-pay | Admitting: Family Medicine

## 2017-10-07 VITALS — BP 118/80 | HR 84 | Temp 98.0°F | Wt 235.4 lb

## 2017-10-07 DIAGNOSIS — Z23 Encounter for immunization: Secondary | ICD-10-CM | POA: Diagnosis not present

## 2017-10-07 DIAGNOSIS — M25561 Pain in right knee: Secondary | ICD-10-CM | POA: Diagnosis not present

## 2017-10-07 MED ORDER — LIDOCAINE HCL (PF) 1 % IJ SOLN
2.0000 mL | Freq: Once | INTRAMUSCULAR | Status: AC
  Administered 2017-10-07: 2 mL via INTRADERMAL

## 2017-10-07 MED ORDER — TRIAMCINOLONE ACETONIDE 40 MG/ML IJ SUSP
40.0000 mg | Freq: Once | INTRAMUSCULAR | Status: AC
  Administered 2017-10-07: 40 mg via INTRAMUSCULAR

## 2017-10-07 MED ORDER — LIDOCAINE-EPINEPHRINE (PF) 1 %-1:200000 IJ SOLN: 10.0000 mL | Freq: Once | INTRAMUSCULAR | Status: DC

## 2017-10-07 NOTE — Progress Notes (Signed)
   Subjective:    Patient ID: Katherine Lewis, female    DOB: 07/20/1970, 47 y.o.   MRN: [id number]  HPI She is here for right knee injection.  Recent x-rays were essentially negative.  She continues have difficulty with the knee.  She has had previous injection about 6 years ago which apparently did help.   Review of Systems     Objective:   Physical Exam The right knee was prepped laterally.  The joint line was identified.  40 mg of Kenalog and 3 cc of Xylocaine was injected into the knee without difficulty.       Assessment & Plan:  Need for influenza vaccination - Plan: Flu Vaccine QUAD 36+ mos IM  Right knee pain, unspecified chronicity - Plan: triamcinolone acetonide (KENALOG-40) injection 40 mg, lidocaine (PF) (XYLOCAINE) 1 % injection 2 mL, DISCONTINUED: lidocaine-EPINEPHrine (XYLOCAINE-EPINEPHrine) 1 %-1:200000 (PF) injection 10 mL She will keep you informed as to the success of the shot.

## 2017-10-28 ENCOUNTER — Other Ambulatory Visit: Payer: Self-pay | Admitting: Family Medicine

## 2017-12-19 ENCOUNTER — Ambulatory Visit (INDEPENDENT_AMBULATORY_CARE_PROVIDER_SITE_OTHER): Payer: PRIVATE HEALTH INSURANCE | Admitting: Medical

## 2017-12-19 VITALS — BP 134/90 | HR 76 | Temp 98.3°F | Resp 16 | Ht 65.0 in | Wt 241.2 lb

## 2017-12-19 DIAGNOSIS — R053 Chronic cough: Secondary | ICD-10-CM

## 2017-12-19 DIAGNOSIS — N159 Renal tubulo-interstitial disease, unspecified: Secondary | ICD-10-CM

## 2017-12-19 DIAGNOSIS — L709 Acne, unspecified: Secondary | ICD-10-CM

## 2017-12-19 DIAGNOSIS — R300 Dysuria: Secondary | ICD-10-CM | POA: Diagnosis not present

## 2017-12-19 DIAGNOSIS — Z975 Presence of (intrauterine) contraceptive device: Secondary | ICD-10-CM

## 2017-12-19 DIAGNOSIS — J069 Acute upper respiratory infection, unspecified: Secondary | ICD-10-CM

## 2017-12-19 DIAGNOSIS — G4730 Sleep apnea, unspecified: Secondary | ICD-10-CM

## 2017-12-19 DIAGNOSIS — R5383 Other fatigue: Secondary | ICD-10-CM | POA: Insufficient documentation

## 2017-12-19 DIAGNOSIS — R05 Cough: Secondary | ICD-10-CM

## 2017-12-19 LAB — POCT URINALYSIS DIP (PROADVANTAGE DEVICE)
Bilirubin, UA: NEGATIVE
Blood, UA: NEGATIVE
GLUCOSE UA: NEGATIVE mg/dL
Ketones, POC UA: NEGATIVE mg/dL
Leukocytes, UA: NEGATIVE
NITRITE UA: NEGATIVE
PROTEIN UA: NEGATIVE mg/dL
Specific Gravity, Urine: 1.025
UUROB: NEGATIVE
pH, UA: 6 (ref 5.0–8.0)

## 2017-12-19 MED ORDER — MINOCYCLINE HCL 100 MG PO CAPS: 100.0000 mg | ORAL_CAPSULE | Freq: Two times a day (BID) | ORAL | 1 refills | Status: DC

## 2017-12-19 NOTE — Progress Notes (Signed)
Subjective: Chief Complaint  Patient presents with  . cough    kidney infection, back pain X 2weeks,  cold, cough fatigue X thursday   Here for a few concerns.   Has cold symptoms that have been going on several days, but thinks this may be masking symptoms of UTI.  She notes cough ongoing even from prior visit with Dr. Redmond School here.  Since Thanksgiving 5 days has had congestion, cough and cold symptoms she picked up from family member last week.   She notes a few weeks hx/o discolored urine, severe pain in lower back, feeling exhausted, way worse fatigue than ever before.  No burning with urination.  Is having urinary urgency and frequency.   No abdominal pain.    Regarding fatigue, she reports having recent labs done.   She denies thyroid issues, anemia.   She notes having recent labs done to enter weight management program called [employer].  Has been tested for sleep apnea, had sleep apnea, but was unable to tolerate masks for this.  Has hx/o weight loss surgery 04/2014.   She notes compliance with minocycline for acne, compliant with BP medication Losartan HCT and Lexapro.    Has Mirena.   She wants refill on Minocycline she uses for 2 weeks at a time for acne flare.    She notes more significant fatigue the last several weeks, despite 8 hours of sleep.  Going into work exhausted.  No chest pain, no DOE.  Walks some for exercise.  No leg swelling.     Past Medical History:  Diagnosis Date  . Anxiety   . Depression   . GERD (gastroesophageal reflux disease)   . Hypertension   . Hypothyroidism   . Insomnia   . Plantar fasciitis   . Sleep apnea    does not wear Cpap  . Vitamin D deficiency    Current Outpatient Medications on File Prior to Visit  Medication Sig Dispense Refill  . escitalopram (LEXAPRO) 5 MG tablet Take 1 tablet (5 mg total) by mouth daily. 90 tablet 3  . levonorgestrel (MIRENA) 20 MCG/24HR IUD 1 each by Intrauterine route once. Reported on 06/23/2015    .  losartan-hydrochlorothiazide (HYZAAR) 50-12.5 MG tablet TAKE (1/2) TABLET DAILY. 45 tablet 0  . buPROPion (WELLBUTRIN XL) 150 MG 24 hr tablet TAKE 1 TABLET IN THE MORNING. (Patient not taking: Reported on 10/04/2017) 90 tablet 0  . cholecalciferol (VITAMIN D) 1000 UNITS tablet Take 1,000 Units by mouth daily. Reported on 06/23/2015    . Naltrexone-Bupropion HCl ER (CONTRAVE) 8-90 MG TB12 Take as per manufacturer's recommendations (Patient not taking: Reported on 06/16/2017) 70 tablet 0   No current facility-administered medications on file prior to visit.    ROS as in subjective    Objective: BP 134/90   Pulse 76   Temp 98.3 F (36.8 C) (Oral)   Resp 16   Ht 5' 5"  (1.651 m)   Wt 241 lb 3.2 oz (109.4 kg)   SpO2 98%   BMI 40.14 kg/m   General appearance: alert, no distress, WD/WN,  HEENT: normocephalic, sclerae anicteric, TMs pearly, nares patent, no discharge or erythema, pharynx with mild erythema Oral cavity: MMM, no lesions Neck: supple, no lymphadenopathy, no thyromegaly, no masses Heart: RRR, normal S1, S2, no murmurs Lungs: CTA bilaterally, no wheezes, rhonchi, or rales Abdomen: +bs, soft, non tender, non distended, no masses, no hepatomegaly, no splenomegaly Pulses: 2+ symmetric, upper and lower extremities, normal cap refill Back: Back nontender,  normal range of motion without pain, no deformity Hips nontender normal range of motion Ext: no edema No neurological deficit    Assessment: Encounter Diagnoses  Name Primary?  . Dysuria Yes  . Fatigue, unspecified type   . Viral upper respiratory tract infection   . Mild sleep apnea   . Acne, unspecified acne type   . IUD (intrauterine device) in place   . Kidney infection   . Chronic cough      Plan: Dysuria-urinalysis normal, await urine culture.  Discussed symptoms that would prompt a call back sooner.  Hydrate well.  Fatigue-discussed the wide differential.  She notes that she had lab work done within the last 2  or 3 months here through Dr. Redmond School, but possibly had a blood draw with [facility].  I do not see those records in the chart but we will try to cut them down.  She declines labs today  Viral URI-discussed supportive care for this  Mild sleep apnea per prior sleep study-she notes intolerance to prior CPAP devices.  Acne-refilled minocycline for as needed use that she uses when out of town  Chronic cough-she feels like this is related to acid reflux and recent cold symptoms.  She declines chest x-ray today.  Advised that the cough persist in the next 2 to 3 weeks despite the recent cold symptoms that she would need a chest x-ray  Follow-up pending urine culture and lab review.  Advised that if she has not heard back within 1 week to call back as we are having trouble finding the lab results  Lilas was seen today for cough.  Diagnoses and all orders for this visit:  Dysuria -     Urine Culture  Fatigue, unspecified type -     Urine Culture  Viral upper respiratory tract infection  Mild sleep apnea  Acne, unspecified acne type  IUD (intrauterine device) in place  Kidney infection -     POCT Urinalysis DIP (Proadvantage Device)  Chronic cough  Other orders -     minocycline (MINOCIN,DYNACIN) 100 MG capsule; Take 1 capsule (100 mg total) by mouth 2 (two) times daily.

## 2017-12-21 LAB — URINE CULTURE: Organism ID, Bacteria: NO GROWTH

## 2017-12-23 ENCOUNTER — Encounter: Payer: Self-pay | Admitting: Family Medicine

## 2017-12-27 ENCOUNTER — Other Ambulatory Visit: Payer: Self-pay | Admitting: Medical

## 2017-12-27 MED ORDER — BENZONATATE 200 MG PO CAPS: 200.0000 mg | ORAL_CAPSULE | Freq: Three times a day (TID) | ORAL | 0 refills | Status: DC | PRN

## 2018-01-24 ENCOUNTER — Ambulatory Visit: Payer: PRIVATE HEALTH INSURANCE | Admitting: Family Medicine

## 2018-01-24 ENCOUNTER — Encounter: Payer: Self-pay | Admitting: Family Medicine

## 2018-01-24 VITALS — BP 124/70 | HR 83 | Temp 98.8°F | Resp 16 | Ht 65.0 in | Wt 239.2 lb

## 2018-01-24 DIAGNOSIS — J209 Acute bronchitis, unspecified: Secondary | ICD-10-CM | POA: Diagnosis not present

## 2018-01-24 DIAGNOSIS — G8929 Other chronic pain: Secondary | ICD-10-CM

## 2018-01-24 DIAGNOSIS — M1711 Unilateral primary osteoarthritis, right knee: Secondary | ICD-10-CM

## 2018-01-24 DIAGNOSIS — M25561 Pain in right knee: Secondary | ICD-10-CM | POA: Diagnosis not present

## 2018-01-24 MED ORDER — ALBUTEROL SULFATE HFA 108 (90 BASE) MCG/ACT IN AERS: 2.0000 | INHALATION_SPRAY | Freq: Four times a day (QID) | RESPIRATORY_TRACT | 2 refills | Status: DC | PRN

## 2018-01-24 MED ORDER — CLARITHROMYCIN 500 MG PO TABS: 500.0000 mg | ORAL_TABLET | Freq: Two times a day (BID) | ORAL | 0 refills | Status: DC

## 2018-01-24 NOTE — Progress Notes (Signed)
   Subjective:    Patient ID: Katherine Lewis, female    DOB: 08-26-1970, 48 y.o.   MRN: [id number]  HPI She is here for consult concerning continued difficulty with coughing.  This started in November.  No fever, chills, chest congestion, sore throat, earache.  She did try Prilosec but it had no effect on the cough.  She does note that the coughing does get worse with exercise but also coughs at other times and cannot relate this necessarily to eating or lying down. She also continues have difficulty with right knee pain.  She has had a previous x-ray which did show some degenerative changes especially medially.  She was given an injection in September and had pain relief for several hours and then the pain came back.  No popping, locking or grinding.   Review of Systems     Objective:   Physical Exam Alert and in no distress. Tympanic membranes and canals are normal. Pharyngeal area is normal. Neck is supple without adenopathy or thyromegaly. Cardiac exam shows a regular sinus rhythm without murmurs or gallops. Lungs are clear to auscultation. Right knee exam does show an effusion.  No palpable tenderness.  Anterior drawer negative.  McMurray's testing negative.  Medial lateral collateral ligaments intact.  Good motion of the knee.       Assessment & Plan:  Chronic pain of right knee - Plan: MR Knee Right Wo Contrast  Acute bronchitis, unspecified organism - Plan: clarithromycin (BIAXIN) 500 MG tablet, albuterol (PROVENTIL HFA;VENTOLIN HFA) 108 (90 Base) MCG/ACT inhaler  Arthritis of knee, right She will take the antibiotic and hopefully this will get rid of her cough if not, she is to return for further evaluation to look for other causes of the cough.  Discussed that this could also be a component of exercise-induced asthma. Since she continues have difficulty with knee pain, I recommend she take 800 mg of ibuprofen.  We will order an MRI since she has not responded to the steroid  injection.

## 2018-01-31 ENCOUNTER — Ambulatory Visit
Admission: RE | Admit: 2018-01-31 | Discharge: 2018-01-31 | Disposition: A | Discharge status: Home / Self Care | Payer: PRIVATE HEALTH INSURANCE | Source: Ambulatory Visit | Attending: Family Medicine | Admitting: Family Medicine

## 2018-01-31 DIAGNOSIS — M25561 Pain in right knee: Principal | ICD-10-CM

## 2018-01-31 DIAGNOSIS — G8929 Other chronic pain: Secondary | ICD-10-CM

## 2018-02-02 ENCOUNTER — Other Ambulatory Visit: Payer: Self-pay | Admitting: Family Medicine

## 2018-02-02 ENCOUNTER — Other Ambulatory Visit: Payer: Self-pay

## 2018-02-02 DIAGNOSIS — M25561 Pain in right knee: Principal | ICD-10-CM

## 2018-02-02 DIAGNOSIS — G8929 Other chronic pain: Secondary | ICD-10-CM

## 2018-02-06 ENCOUNTER — Ambulatory Visit (INDEPENDENT_AMBULATORY_CARE_PROVIDER_SITE_OTHER): Payer: PRIVATE HEALTH INSURANCE | Admitting: Orthopedic Surgery

## 2018-02-06 ENCOUNTER — Encounter (INDEPENDENT_AMBULATORY_CARE_PROVIDER_SITE_OTHER): Payer: Self-pay | Admitting: Orthopedic Surgery

## 2018-02-06 ENCOUNTER — Telehealth (INDEPENDENT_AMBULATORY_CARE_PROVIDER_SITE_OTHER): Payer: Self-pay | Admitting: Radiology

## 2018-02-06 DIAGNOSIS — M1712 Unilateral primary osteoarthritis, left knee: Secondary | ICD-10-CM

## 2018-02-06 NOTE — Telephone Encounter (Signed)
Gel injection right knee

## 2018-02-06 NOTE — Progress Notes (Signed)
Office Visit Note   Patient: Katherine Lewis           Date of Birth: 10-14-1970           MRN: [id number] Visit Date: 02/06/2018 Requested by: Denita Lung, MD [place], [place] [address] PCP: Denita Lung, MD  Subjective: Chief Complaint  Patient presents with  . 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  MRI done 01/31/2018 is reviewed.  She does have significant arthritis in the medial compartment along with a loose body in the posterior lateral compartment.  She had a cortisone injection in October which did not help her.  Reports almost all of the pain on the medial aspect of the knee.  She is not able to exercise because of this knee pain.  Does hurt her to sleep as well.  She has symptoms on a daily basis.  Does report some occasional mechanical symptoms.  Takes Tylenol which does not help much.              ROS: All systems reviewed are negative as they relate to the chief complaint within the history of present illness.  Patient denies  fevers or chills.   Assessment & Plan: Visit Diagnoses:  1. Unilateral primary osteoarthritis, left knee     Plan: Impression is right knee pain with loose body in the posterior lateral compartment which may or may not be symptomatic and significant medial compartment arthritis which I think is symptomatic based on her symptoms and exam.  She does have an effusion today and I aspirated that out and got about 20 cc.  This was clear yellow fluid consistent with her known diagnosis of arthritis.  I would like to preapproved for for Synvisc injection to be done in 1 to 2 weeks.  We discussed non-loadbearing quad strengthening exercises and strategies to diminish the force across the knee as well.  I think she may be heading for a partial knee replacement at sometime in her 31s.  I will see her back in 1 to 2 weeks for gel injection.  Follow-Up Instructions:  Return in about 2 weeks (around 02/20/2018).   Orders:  No orders of the defined types were placed in this encounter.  No orders of the defined types were placed in this encounter.     Procedures: No procedures performed   Clinical Data: No additional findings.  Objective: Vital Signs: There were no vitals taken for this visit.  Physical Exam:   Constitutional: Patient appears well-developed HEENT:  Head: Normocephalic Eyes:EOM are normal Neck: Normal range of motion Cardiovascular: Normal rate Pulmonary/chest: Effort normal Neurologic: Patient is alert Skin: Skin is warm Psychiatric: Patient has normal mood and affect    Ortho Exam: Ortho exam demonstrates tenderness to palpation medial joint line right knee.  She has also a little bit of clicking posterior lateral knee consistent with her known diagnosis of loose body in that region.  Collateral cruciate ligaments are stable in the right knee.  Effusion is present.  No groin pain with internal extra rotation of the leg and pedal pulses are palpable.  Extensor mechanism is intact and nontender.  Specialty Comments:  No specialty comments available.  Imaging: No results found.   PMFS History: Patient Active Problem List   Diagnosis Date Noted  . IUD (intrauterine device) in place 12/19/2017  . Fatigue 12/19/2017  . Mild sleep apnea  12/19/2017  . Acne 12/19/2017  . Dysuria 12/19/2017  . 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 Chronic cough 04/03/2012   Past Medical History:  Diagnosis Date  . Anxiety   . Depression   . GERD (gastroesophageal reflux disease)   . Hypertension   . Hypothyroidism   . Insomnia   . Plantar fasciitis   . Sleep apnea    does not wear Cpap  . Vitamin D deficiency     Family History  Problem Relation Age of Onset  . Diabetes type II Mother   . Diabetes Mother   . Lung cancer Paternal Grandmother   . Lung cancer Paternal Grandfather     Past Surgical  History:  Procedure Laterality Date  . LAPAROSCOPIC GASTRIC SLEEVE RESECTION N/A 04/23/2014   Procedure: LAPAROSCOPIC GASTRIC SLEEVE RESECTION WITH UPPER ENDOSCOPY;  Surgeon: Johnathan Hausen, MD;  Location: [facility] ORS;  Service: General;  Laterality: N/A;  . MYRINGOTOMY     bilateral    Social History   Occupational History  . Occupation: Science writer: Programmer, applications  Tobacco Use  . Smoking status: Former Smoker    Packs/day: 0.50    Years: 6.00    Pack years: 3.00    Types: Cigarettes    Last attempt to quit: 01/19/1992    Years since quitting: 26.0  . Smokeless tobacco: Never Used  . Tobacco comment: social   Substance and Sexual Activity  . Alcohol use: Yes    Comment: social   . Drug use: No  . Sexual activity: Yes

## 2018-02-09 NOTE — Telephone Encounter (Signed)
Noted  

## 2018-02-13 NOTE — Telephone Encounter (Signed)
Patient called to check on the status of her gel injection.  Thank you.

## 2018-02-14 ENCOUNTER — Telehealth (INDEPENDENT_AMBULATORY_CARE_PROVIDER_SITE_OTHER): Payer: Self-pay

## 2018-02-14 NOTE — Telephone Encounter (Signed)
Submitted VOB for Monovisc, right knee.

## 2018-02-14 NOTE — Telephone Encounter (Signed)
Called and a VM for patient concerning gel injection.

## 2018-02-22 ENCOUNTER — Encounter: Payer: Self-pay | Admitting: Family Medicine

## 2018-02-22 MED ORDER — FLUCONAZOLE 150 MG PO TABS: 150.0000 mg | ORAL_TABLET | Freq: Once | ORAL | 0 refills | Status: AC

## 2018-02-22 NOTE — Telephone Encounter (Signed)
Patient called wanted to check on her gel injection request

## 2018-02-22 NOTE — Telephone Encounter (Signed)
Talked with patient and advised her of approval for gel injection.  Advised patient that Monovisc has to be obtained through her SPP [facility].  Will fax Rx tomorrow.

## 2018-02-23 ENCOUNTER — Telehealth (INDEPENDENT_AMBULATORY_CARE_PROVIDER_SITE_OTHER): Payer: Self-pay

## 2018-02-23 NOTE — Telephone Encounter (Signed)
Faxed Rx for Monovisc to [facility] at [phone] to obtaine through [facility] under medical benefits.

## 2018-03-09 ENCOUNTER — Telehealth (INDEPENDENT_AMBULATORY_CARE_PROVIDER_SITE_OTHER): Payer: Self-pay

## 2018-03-09 NOTE — Telephone Encounter (Signed)
If cortisone did not help in October and the gel is too expensive then her options are to let me do a cortisone injection to see if that would help versus non-loadbearing quad strengthening exercises versus waiting and out versus partial knee replacement.  I do not think an osteotomy is necessarily a great idea at this time either.  If she is looking towards the partial knee replacement then we would need to make sure that there is no other wear and tear in the other compartments and we would have to do that with MRI scanning.

## 2018-03-09 NOTE — Telephone Encounter (Signed)
Called and left a VM advising patient that her consent is needed through [facility] to deliver the gel injection to our office.  Provided patient with the [facility] phone number, [phone]

## 2018-03-09 NOTE — Telephone Encounter (Signed)
Please advise. Thanks.  

## 2018-03-09 NOTE — Telephone Encounter (Signed)
Patient called and left a VM stating that For her to receive the gel injection through the [place], she would have a co-pay of $1500.00.  Stated that she can not afford that at this time.  Would like to know what other options may be available?  Cb# [phone].  Please advise.  Thank you.

## 2018-03-10 NOTE — Telephone Encounter (Signed)
IC s/w patient and advised per Dr Marlou Sa. She states she is just going to try to deal with the pain.  She had an MRI scan in January. She states she did get some relief with the aspiration and if fluid returns will call back for possible repeat aspiration.

## 2018-03-23 ENCOUNTER — Encounter (INDEPENDENT_AMBULATORY_CARE_PROVIDER_SITE_OTHER): Payer: Self-pay | Admitting: Orthopedic Surgery

## 2018-03-23 ENCOUNTER — Ambulatory Visit (INDEPENDENT_AMBULATORY_CARE_PROVIDER_SITE_OTHER): Payer: PRIVATE HEALTH INSURANCE | Admitting: Orthopedic Surgery

## 2018-03-23 DIAGNOSIS — M1711 Unilateral primary osteoarthritis, right knee: Secondary | ICD-10-CM | POA: Diagnosis not present

## 2018-03-23 MED ORDER — LIDOCAINE HCL 1 % IJ SOLN
5.0000 mL | INTRAMUSCULAR | Status: AC | PRN
  Administered 2018-03-23: 5 mL

## 2018-03-23 NOTE — Progress Notes (Signed)
Office Visit Note   Patient: Katherine Lewis           Date of Birth: 1970-08-18           MRN: [id number] Visit Date: 03/23/2018 Requested by: Denita Lung, MD [place], [place] [address] PCP: Denita Lung, MD  Subjective: Chief Complaint  Patient presents with  . Right Knee - Follow-up    HPI: Katherine Lewis is a patient with right knee pain and known right knee arthritis.  MRI scan shows medial compartment arthritis along with some chondral thinning on the lateral side and spurring on the lateral side.  Moderate patellofemoral arthritis also present.  She had knee aspiration done several months ago and that helped.  She is considering gel injections but the cost of that for her is somewhat prohibitive.  She wants to discuss partial versus total knee replacement.  She is taking ibuprofen for the problem.  No personal or family history of DVT or pulmonary embolism.  If she walks about 1/2 mile at school she has 3 days of pain.  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              ROS: All systems reviewed are negative as they relate to the chief complaint within the history of present illness.  Patient denies  fevers or chills.   Assessment & Plan: Visit Diagnoses:  Right knee arthritis  Plan: Impression is right knee arthritis with flexion contracture and basically tricompartmental changes which I think are a relative contraindication to partial knee replacement.  Although she is having pain predominantly on the medial aspect of the knee the MRI scan does show degenerative changes in the other parts of the knee.  I think it is possible that with a partial knee replacement she could develop lateral and patellofemoral compartment arthritis within 5 to 7 years.  Total knee replacement is a bigger operation with bigger recovery.   Press-fit prosthesis would be a good option for her for longevity reasons; however, there is a cumulative risk of infection and revision that she would likely face in her lifetime.  Essentially this boils down to being functional during her prime working career and facing the likelihood of a revision surgery versus going on like she is which is very difficult due to the pain with her activities of daily living.  She is going to consider her options and let me know if she wants to schedule any type of intervention..  The risk and benefits of the procedure are discussed including not limited to infection nerve vessel damage knee stiffness as well as incomplete pain relief and incomplete restoration of function.  I think she would be out of work in terms of driving for at least a month as this is her right leg.  Patient understands the risk and benefits and she will let me know if she wants to proceed.  Follow-Up Instructions: Return if symptoms worsen or fail to improve.   Orders:  No orders of the defined types were placed in this encounter.  No orders of the defined types were placed in this encounter.     Procedures: Large Joint Inj: R knee on 03/23/2018 8:02 PM Indications: diagnostic evaluation, joint swelling and pain Details: 18 G 1.5 in needle, superolateral approach  Arthrogram: No  Medications: 5 mL lidocaine 1 % Aspirate: 30 mL serous Outcome: tolerated well, no immediate complications Procedure, treatment alternatives, risks and benefits explained, specific risks discussed. Consent was given by the patient. Immediately prior to procedure a time out was called to verify the correct patient, procedure, equipment, support staff and site/side marked as required. Patient was prepped and draped in the usual sterile fashion.       Clinical Data: No additional findings.  Objective: Vital Signs: There were no vitals taken for this visit.  Physical Exam:   Constitutional: Patient  appears well-developed HEENT:  Head: Normocephalic Eyes:EOM are normal Neck: Normal range of motion Cardiovascular: Normal rate Pulmonary/chest: Effort normal Neurologic: Patient is alert Skin: Skin is warm Psychiatric: Patient has normal mood and affect    Ortho Exam: Ortho exam demonstrates right knee flexion contracture of about 10 degrees with left leg straight.  Effusion is present in that right knee.  Collateral cruciate ligaments are stable.  Pedal pulses palpable.  She does have flexion to about 105 degrees on the right and about 115 on the left.  No groin pain with internal X rotation of either leg.  Gait appears relatively normal. Specialty Comments:  No specialty comments available.  Imaging: No results found.   PMFS History: Patient Active Problem List   Diagnosis Date Noted  . IUD (intrauterine device) in place 12/19/2017  . Fatigue 12/19/2017  . Mild sleep apnea 12/19/2017  . Acne 12/19/2017  . Dysuria 12/19/2017  . 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 Chronic cough 04/03/2012   Past Medical History:  Diagnosis Date  . Anxiety   . Depression   . GERD (gastroesophageal reflux disease)   . Hypertension   . Hypothyroidism   . Insomnia   . Plantar fasciitis   . Sleep apnea    does not wear Cpap  . Vitamin D deficiency     Family History  Problem Relation Age of Onset  . Diabetes type II Mother   . Diabetes Mother   . Lung cancer Paternal Grandmother   . Lung cancer Paternal Grandfather     Past Surgical History:  Procedure Laterality Date  . LAPAROSCOPIC GASTRIC SLEEVE RESECTION N/A 04/23/2014   Procedure: LAPAROSCOPIC GASTRIC SLEEVE RESECTION WITH UPPER ENDOSCOPY;  Surgeon: Johnathan Hausen, MD;  Location: [facility];  Service: General;  Laterality: N/A;  . MYRINGOTOMY     bilateral    Social History   Occupational History  . Occupation: Science writer: Programmer, applications  Tobacco Use  . Smoking status: Former  Smoker    Packs/day: 0.50    Years: 6.00    Pack years: 3.00    Types: Cigarettes    Last attempt to quit: 01/19/1992    Years since quitting: 26.1  . Smokeless tobacco: Never Used  . Tobacco comment: social   Substance and Sexual Activity  . Alcohol use: Yes    Comment: social   . Drug use: No  . Sexual activity: Yes

## 2018-04-01 ENCOUNTER — Encounter (HOSPITAL_COMMUNITY): Payer: Self-pay | Admitting: Emergency Medicine

## 2018-04-01 ENCOUNTER — Other Ambulatory Visit: Payer: Self-pay

## 2018-04-01 ENCOUNTER — Emergency Department (HOSPITAL_COMMUNITY)
Admission: EM | Admit: 2018-04-01 | Discharge: 2018-04-01 | Disposition: A | Discharge status: Home / Self Care | Payer: PRIVATE HEALTH INSURANCE | Attending: Emergency Medicine | Admitting: Emergency Medicine

## 2018-04-01 DIAGNOSIS — J069 Acute upper respiratory infection, unspecified: Secondary | ICD-10-CM | POA: Insufficient documentation

## 2018-04-01 DIAGNOSIS — J029 Acute pharyngitis, unspecified: Secondary | ICD-10-CM | POA: Diagnosis present

## 2018-04-01 DIAGNOSIS — B9789 Other viral agents as the cause of diseases classified elsewhere: Secondary | ICD-10-CM

## 2018-04-01 DIAGNOSIS — Z87891 Personal history of nicotine dependence: Secondary | ICD-10-CM | POA: Insufficient documentation

## 2018-04-01 DIAGNOSIS — Z79899 Other long term (current) drug therapy: Secondary | ICD-10-CM | POA: Diagnosis not present

## 2018-04-01 LAB — GROUP A STREP BY PCR: GROUP A STREP BY PCR: NOT DETECTED

## 2018-04-01 MED ORDER — BENZONATATE 100 MG PO CAPS: 100.0000 mg | ORAL_CAPSULE | Freq: Three times a day (TID) | ORAL | 0 refills | Status: DC

## 2018-04-01 NOTE — Discharge Instructions (Addendum)
You have been diagnosed today with viral upper respiratory tract infection with cough.  At this time there does not appear to be the presence of an emergent medical condition, however there is always the potential for conditions to change. Please read and follow the below instructions.  Please return to the Emergency Department immediately for any new or worsening symptoms. Please be sure to follow up with your Primary Care Provider within one week regarding your visit today; please call their office to schedule an appointment even if you are feeling better for a follow-up visit. Please drink plenty water and get plenty of rest over the next few days to help with your symptoms.  You may use the cough medication Tessalon as prescribed.  As we discussed you have declined Tamiflu today. Please take Ibuprofen (Advil, motrin) and Tylenol (acetaminophen) to relieve your pain.  You may take up to 400 MG (2 pills) of normal strength ibuprofen every 8 hours as needed.  In between doses of ibuprofen you make take tylenol, up to 500 mg (one extra strength pill).  Do not take more than 3,000 mg tylenol in a 24 hour period.  Please check all medication labels as many medications such as pain and cold medications may contain tylenol.  Do not drink alcohol while taking these medications.  Do not take other NSAID'S while taking ibuprofen (such as aleve or naproxen).  Please take ibuprofen with food to decrease stomach upset. Based on your recent travel history and symptoms we advise that you continue to monitor your symptoms, avoid large crowds and unnecessary outings.  It is likely that your symptoms may be due to a common cold or the flu.  However there is always a possibility that exposure to the COVID-19 virus, currently we are not testing well-appearing patients. Please wash your hands often and cover your cough as you should with all viral infections. Return for any new or worsening symptoms.  Get help right away  if: You have shortness of breath that gets worse. You have Fever over 100.4 degrees Fahrenheit You have very bad or constant: Headache. Ear pain. Pain in your forehead, behind your eyes, and over your cheekbones (sinus pain). Chest pain. You have long-lasting (chronic) lung disease along with any of these: Wheezing. Long-lasting cough. Coughing up blood. A change in your usual mucus. You have a stiff neck. You have changes in your: Vision. Hearing. Thinking. Mood.  Please read the additional information packets attached to your discharge summary.

## 2018-04-01 NOTE — ED Triage Notes (Signed)
Pt to ER for evaluation of sore throat, fatigue, and cough onset Thursday night after returning from [place] Afebrile at this time. NAD.

## 2018-04-01 NOTE — ED Provider Notes (Addendum)
[facility] EMERGENCY DEPARTMENT Provider Note   CSN: [id number] Arrival date & time: 04/01/18  1013    History   Chief Complaint Chief Complaint  Patient presents with  . Sore Throat  . Cough    HPI Katherine Lewis is a 48 y.o. female without history of immunocompromising diseases presents today for 1 day history of flulike illness.  Patient reports that she returned from a trip to [place] yesterday, she flew in through [place] to [place] in [place].  Patient states that her companion and her began having similar symptoms starting last night which include sore throat, generalized body aches and intermittent nonproductive cough.  They deny measuring fevers at home however states they occasionally feel warm.  Patient has not taken any medications for her symptoms prior to arrival.  Patient denies travel to any of the restricted countries currently on the list for COVID-19 however she did travel through an airport.  Patient denies any history of immunocompromising diseases or history of chronic respiratory diseases.     HPI  Past Medical History:  Diagnosis Date  . Anxiety   . Depression   . GERD (gastroesophageal reflux disease)   . Hypertension   . Hypothyroidism   . Insomnia   . Plantar fasciitis   . Sleep apnea    does not wear Cpap  . Vitamin D deficiency     Patient Active Problem List   Diagnosis Date Noted  . IUD (intrauterine device) in place 12/19/2017  . Fatigue 12/19/2017  . Mild sleep apnea 12/19/2017  . Acne 12/19/2017  . Dysuria 12/19/2017  . 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 Chronic cough 04/03/2012    Past Surgical History:  Procedure Laterality Date  . LAPAROSCOPIC GASTRIC SLEEVE RESECTION N/A 04/23/2014   Procedure: LAPAROSCOPIC GASTRIC SLEEVE RESECTION WITH UPPER ENDOSCOPY;  Surgeon: Johnathan Hausen, MD;  Location: [facility];  Service: General;  Laterality: N/A;  . MYRINGOTOMY     bilateral      OB History   No obstetric history on file.      Home Medications    Prior to Admission medications   Medication Sig Start Date End Date Taking? Authorizing Provider  albuterol (PROVENTIL HFA;VENTOLIN HFA) 108 (90 Base) MCG/ACT inhaler Inhale 2 puffs into the lungs every 6 (six) hours as needed for wheezing or shortness of breath. 01/24/18   Denita Lung, MD  benzonatate (TESSALON) 100 MG capsule Take 1 capsule (100 mg total) by mouth every 8 (eight) hours. 04/01/18   Deliah Boston, PA-C  buPROPion (WELLBUTRIN XL) 150 MG 24 hr tablet TAKE 1 TABLET IN THE MORNING. 04/12/17   Denita Lung, MD  cholecalciferol (VITAMIN D) 1000 UNITS tablet Take 1,000 Units by mouth daily. Reported on 06/23/2015    [provider]  clarithromycin (BIAXIN) 500 MG tablet Take 1 tablet (500 mg total) by mouth 2 (two) times daily. 01/24/18   Denita Lung, MD  escitalopram (LEXAPRO) 5 MG tablet Take 1 tablet (5 mg total) by mouth daily. 10/04/17   Denita Lung, MD  levonorgestrel (MIRENA) 20 MCG/24HR IUD 1 each by Intrauterine route once. Reported on 06/23/2015    [provider]  losartan-hydrochlorothiazide (HYZAAR) 50-12.5 MG tablet TAKE (1/2) TABLET DAILY. 02/02/18   Denita Lung, MD  minocycline (MINOCIN,DYNACIN) 100 MG capsule Take 1 capsule (100 mg total) by mouth 2 (two) times daily. 12/19/17   Tysinger, Camelia Eng, PA-C  Naltrexone-Bupropion HCl ER (CONTRAVE) 8-90 MG  TB12 Take as per manufacturer's recommendations 10/11/16   Denita Lung, MD    Family History Family History  Problem Relation Age of Onset  . Diabetes type II Mother   . Diabetes Mother   . Lung cancer Paternal Grandmother   . Lung cancer Paternal Grandfather     Social History Social History   Tobacco Use  . Smoking status: Former Smoker    Packs/day: 0.50    Years: 6.00    Pack years: 3.00    Types: Cigarettes    Last attempt to quit: 01/19/1992    Years since quitting: 26.2  . Smokeless tobacco: Never  Used  . Tobacco comment: social   Substance Use Topics  . Alcohol use: Yes    Comment: social   . Drug use: No     Allergies   Codeine   Review of Systems Review of Systems  Constitutional: Positive for fever (Subjective). Negative for chills.  HENT: Positive for rhinorrhea and sore throat. Negative for congestion, facial swelling, trouble swallowing and voice change.   Respiratory: Positive for cough. Negative for shortness of breath.   Cardiovascular: Negative.  Negative for chest pain.  Gastrointestinal: Negative.  Negative for abdominal pain, diarrhea, nausea and vomiting.  Musculoskeletal: Positive for arthralgias and myalgias. Negative for neck pain and neck stiffness.  Skin: Negative.  Negative for rash.  Neurological: Negative.  Negative for weakness and headaches.   Physical Exam Updated Vital Signs BP (!) 142/91 (BP Location: Right Arm)   Pulse 87   Temp 98.2 F (36.8 C) (Oral)   Resp 18   SpO2 98%   Physical Exam Constitutional:      General: She is not in acute distress.    Appearance: Normal appearance. She is well-developed. She is not ill-appearing or diaphoretic.  HENT:     Head: Normocephalic and atraumatic.     Jaw: There is normal jaw occlusion. No trismus.     Right Ear: Tympanic membrane, ear canal and external ear normal.     Left Ear: Tympanic membrane, ear canal and external ear normal.     Nose: Nose normal.     Mouth/Throat:     Mouth: Mucous membranes are moist.     Pharynx: Oropharynx is clear. Uvula midline. No pharyngeal swelling or uvula swelling.     Tonsils: No tonsillar exudate or tonsillar abscesses. Swelling: 1+ on the right. 1+ on the left.     Comments: The patient has normal phonation and is in control of secretions. No stridor.  Midline uvula without edema. Soft palate rises symmetrically. No tonsillar erythema, swelling or exudates. Tongue protrusion is normal, floor of mouth is soft. No trismus. No creptius on neck palpation. No  gingival erythema or fluctuance noted. Mucus membranes moist. Eyes:     General: Vision grossly intact. Gaze aligned appropriately.     Conjunctiva/sclera: Conjunctivae normal.     Pupils: Pupils are equal, round, and reactive to light.  Neck:     Musculoskeletal: Full passive range of motion without pain, normal range of motion and neck supple.     Trachea: Trachea and phonation normal. No tracheal tenderness or tracheal deviation.     Meningeal: Brudzinski's sign absent.  Cardiovascular:     Rate and Rhythm: Normal rate and regular rhythm.     Heart sounds: Normal heart sounds.  Pulmonary:     Effort: Pulmonary effort is normal. No accessory muscle usage or respiratory distress.     Breath sounds: Normal breath  sounds and air entry. No stridor. No wheezing or rhonchi.  Chest:     Chest wall: No tenderness.  Abdominal:     General: Bowel sounds are normal. There is no distension.     Palpations: Abdomen is soft.     Tenderness: There is no abdominal tenderness. There is no guarding or rebound.  Musculoskeletal: Normal range of motion.     Comments: Moving all extremities spontaneously  Skin:    General: Skin is warm and dry.  Neurological:     Mental Status: She is alert.     GCS: GCS eye subscore is 4. GCS verbal subscore is 5. GCS motor subscore is 6.     Comments: Speech is clear and goal oriented, follows commands Major Cranial nerves without deficit, no facial droop Moves extremities without ataxia, coordination intact Normal gait  Psychiatric:        Behavior: Behavior normal.     ED Treatments / Results  Labs (all labs ordered are listed, but only abnormal results are displayed) Labs Reviewed  GROUP A STREP BY PCR    EKG None  Radiology No results found.  Procedures Procedures (including critical care time)  Medications Ordered in ED Medications - No data to display   Initial Impression / Assessment and Plan / ED Course  I have reviewed the triage  vital signs and the nursing notes.  Pertinent labs & imaging results that were available during my care of the patient were reviewed by me and considered in my medical decision making (see chart for details).     48 year old female without history of immunocompromising diseases presents today for 1 day history of URI-like symptoms.  Patient did recently returned from the [place]  She has had sore throat, body aches and intermittent nonproductive cough.  Patient overall very well-appearing, vital signs within normal limits.  Lungs clear to auscultation bilaterally.  No indication for imaging at this time.  Strep test was negative. No signs of ludwigs angina, PTA, RPA, or other deep tissue infections of the head or neck. Suspect viral phalangitis related to viral URI.  Patient appears appropriate for discharge at this time, Tessalon provided for cough.  Encouraged water intake and OTC anti-inflammatories.  Discussed with patient that this may be early presentation of influenza, I have offered patient Tamiflu today, risks and benefits were discussed and she has declined Tamiflu.    Patient is without fever or shortness of breath.  No travel to [place], [place], [place], [place], [place], or other high risk countries currently on [employer]. No contact with confirmed COVID patient, per guideline patient is not high risk.  Additionally discussed COVID-19 testing with the patient, as she is well-appearing and without immunocompromising/respiratory risk factors shared decision-making was made, per guidelines testing is not indicated, she states understanding and is agreeable, will proceed with voluntary self-isolation, return if new or worsening symptoms.  Discussed hygiene practices.  At this time there does not appear to be any evidence of an acute emergency medical condition and the patient appears stable for discharge with appropriate outpatient follow up. Diagnosis was discussed with patient who  verbalizes understanding of care plan and is agreeable to discharge. I have discussed return precautions with patient who verbalizes understanding of return precautions. Patient encouraged to follow-up with their PCP. All questions answered.  Patient has been discharged in good condition.  Patient's case discussed with Dr. Johnney Killian who agrees with plan to discharge with follow-up.   Note: Portions of this  report may have been transcribed using voice recognition software. Every effort was made to ensure accuracy; however, inadvertent computerized transcription errors may still be present. Final Clinical Impressions(s) / ED Diagnoses   Final diagnoses:  Viral URI with cough    ED Discharge Orders         Ordered    benzonatate (TESSALON) 100 MG capsule  Every 8 hours     04/01/18 1245           Gari Crown 04/01/18 1300    Charlesetta Shanks, MD 04/01/18 1311    Deliah Boston, PA-C 04/01/18 1315    Charlesetta Shanks, MD 04/01/18 1319

## 2018-04-03 ENCOUNTER — Other Ambulatory Visit: Payer: Self-pay | Admitting: Family Medicine

## 2018-05-18 ENCOUNTER — Other Ambulatory Visit: Payer: Self-pay | Admitting: Surgery

## 2018-05-18 DIAGNOSIS — R109 Unspecified abdominal pain: Secondary | ICD-10-CM

## 2018-05-26 ENCOUNTER — Ambulatory Visit
Admission: RE | Admit: 2018-05-26 | Discharge: 2018-05-26 | Disposition: A | Discharge status: Home / Self Care | Payer: PRIVATE HEALTH INSURANCE | Source: Ambulatory Visit | Attending: Surgery | Admitting: Surgery

## 2018-05-26 ENCOUNTER — Other Ambulatory Visit: Payer: Self-pay

## 2018-05-26 ENCOUNTER — Other Ambulatory Visit: Payer: PRIVATE HEALTH INSURANCE

## 2018-05-26 DIAGNOSIS — R109 Unspecified abdominal pain: Secondary | ICD-10-CM

## 2018-06-16 ENCOUNTER — Other Ambulatory Visit: Payer: Self-pay | Admitting: Surgery

## 2018-06-16 DIAGNOSIS — R109 Unspecified abdominal pain: Secondary | ICD-10-CM

## 2018-06-30 ENCOUNTER — Other Ambulatory Visit: Payer: Self-pay | Admitting: Surgery

## 2018-07-03 ENCOUNTER — Other Ambulatory Visit: Payer: PRIVATE HEALTH INSURANCE

## 2018-07-12 ENCOUNTER — Encounter: Payer: Self-pay | Admitting: Family Medicine

## 2018-07-12 DIAGNOSIS — R053 Chronic cough: Secondary | ICD-10-CM

## 2018-07-12 DIAGNOSIS — R05 Cough: Secondary | ICD-10-CM

## 2018-07-14 ENCOUNTER — Other Ambulatory Visit: Payer: Self-pay

## 2018-07-14 ENCOUNTER — Ambulatory Visit
Admission: RE | Admit: 2018-07-14 | Discharge: 2018-07-14 | Disposition: A | Discharge status: Home / Self Care | Payer: PRIVATE HEALTH INSURANCE | Source: Ambulatory Visit | Attending: Family Medicine | Admitting: Family Medicine

## 2018-07-14 DIAGNOSIS — R053 Chronic cough: Secondary | ICD-10-CM

## 2018-07-14 DIAGNOSIS — R05 Cough: Secondary | ICD-10-CM

## 2018-07-17 ENCOUNTER — Other Ambulatory Visit: Payer: Self-pay

## 2018-07-17 DIAGNOSIS — R05 Cough: Secondary | ICD-10-CM

## 2018-07-17 DIAGNOSIS — R053 Chronic cough: Secondary | ICD-10-CM

## 2018-07-25 ENCOUNTER — Encounter: Payer: Self-pay | Admitting: Family Medicine

## 2018-08-03 ENCOUNTER — Ambulatory Visit: Payer: PRIVATE HEALTH INSURANCE | Admitting: Pulmonary Disease

## 2018-08-03 ENCOUNTER — Encounter: Payer: Self-pay | Admitting: Pulmonary Disease

## 2018-08-03 ENCOUNTER — Other Ambulatory Visit: Payer: Self-pay

## 2018-08-03 VITALS — BP 118/80 | HR 86 | Temp 98.2°F | Ht 65.0 in | Wt 243.0 lb

## 2018-08-03 DIAGNOSIS — R05 Cough: Secondary | ICD-10-CM | POA: Diagnosis not present

## 2018-08-03 DIAGNOSIS — R053 Chronic cough: Secondary | ICD-10-CM

## 2018-08-03 MED ORDER — PANTOPRAZOLE SODIUM 40 MG PO TBEC: 40.0000 mg | DELAYED_RELEASE_TABLET | Freq: Every day | ORAL | 1 refills | Status: DC

## 2018-08-03 NOTE — Progress Notes (Signed)
Subjective:    Patient ID: Katherine Lewis, female    DOB: [birth year], 48 y.o.   MRN: [id number]  Chronic cough about a years duration  Of is usually worse during the night and also early morning Has a sensation of fluttering in her chest sometimes  Denies any history of significant allergy No history of asthma  Does have a history of reflux in the past  History of being overweight with surgical intervention  History of obstructive sleep apnea-did not tolerate CPAP   Past Medical History:  Diagnosis Date  . Anxiety   . Depression   . GERD (gastroesophageal reflux disease)   . Hypertension   . Hypothyroidism   . Insomnia   . Plantar fasciitis   . Sleep apnea    does not wear Cpap  . Vitamin D deficiency    Social History   Socioeconomic History  . Marital status: Married    Spouse name: Not on file  . Number of children: Not on file  . Years of education: Not on file  . Highest education level: Not on file  Occupational History  . Occupation: Science writer: [employer]  Social Needs  . Financial resource strain: Not on file  . Food insecurity    Worry: Not on file    Inability: Not on file  . Transportation needs    Medical: Not on file    Non-medical: Not on file  Tobacco Use  . Smoking status: Former Smoker    Packs/day: 0.50    Years: 6.00    Pack years: 3.00    Types: Cigarettes    Quit date: 01/19/1992    Years since quitting: 26.5  . Smokeless tobacco: Never Used  . Tobacco comment: social   Substance and Sexual Activity  . Alcohol use: Yes    Comment: social   . Drug use: No  . Sexual activity: Yes  Lifestyle  . Physical activity    Days per week: Not on file    Minutes per session: Not on file  . Stress: Not on file  Relationships  . Social Herbalist on phone: Not on file    Gets together: Not on file    Attends religious service: Not on file    Active member of club or organization: Not on file    Attends  meetings of clubs or organizations: Not on file    Relationship status: Not on file  . Intimate partner violence    Fear of current or ex partner: Not on file    Emotionally abused: Not on file    Physically abused: Not on file    Forced sexual activity: Not on file  Other Topics Concern  . Not on file  Social History Narrative  . Not on file   Family History  Problem Relation Age of Onset  . Diabetes type II Mother   . Diabetes Mother   . Lung cancer Paternal Grandmother   . Lung cancer Paternal Grandfather       Review of Systems  Constitutional: Negative for fever and unexpected weight change.  HENT: Negative for congestion, dental problem, ear pain, nosebleeds, postnasal drip, rhinorrhea, sinus pressure, sneezing, sore throat and trouble swallowing.   Eyes: Negative for redness and itching.  Respiratory: Positive for cough. Negative for chest tightness, shortness of breath and wheezing.   Cardiovascular: Negative for palpitations and leg swelling.  Gastrointestinal: Negative for nausea and vomiting.  Genitourinary:  Negative for dysuria.  Musculoskeletal: Negative for joint swelling.  Skin: Negative for rash.  Allergic/Immunologic: Negative.  Negative for environmental allergies, food allergies and immunocompromised state.  Neurological: Negative for headaches.  Hematological: Does not bruise/bleed easily.  Psychiatric/Behavioral: Negative for dysphoric mood. The patient is not nervous/anxious.        Objective:   Physical Exam Constitutional:      Appearance: Normal appearance.  HENT:     Head: Normocephalic and atraumatic.  Neck:     Musculoskeletal: Normal range of motion and neck supple.  Cardiovascular:     Rate and Rhythm: Regular rhythm.  Pulmonary:     Effort: Pulmonary effort is normal. No respiratory distress.     Breath sounds: Normal breath sounds. No stridor.  Abdominal:     General: There is no distension.     Palpations: There is no mass.      Tenderness: There is no abdominal tenderness.  Musculoskeletal: Normal range of motion.        General: No swelling or tenderness.  Skin:    General: Skin is warm and dry.     Coloration: Skin is not jaundiced or pale.  Neurological:     General: No focal deficit present.     Mental Status: She is alert and oriented to person, place, and time.  Psychiatric:        Mood and Affect: Mood normal.        Behavior: Behavior normal.    Vitals:   08/03/18 1502  BP: 118/80  Pulse: 86  Temp: 98.2 F (36.8 C)  SpO2: 97%   Redness chest x-ray reviewed showing no acute infiltrate     Assessment & Plan:  .  Chronic cough -Likely related to reflux -Pathophysiology of a cough -Common causes of chronic cough discussed  History of obstructive sleep apnea -Intolerant to CPAP  Obesity -Continues to work on weight loss  Plan: Pulmonary function study Trial with Protonix Sleep with the head of the bed elevated Avoid meals up to 3 hours prior to bedtime Continue to work on weight loss  We will see back in the office in about 4 to 6 weeks Call with significant concerns

## 2018-08-03 NOTE — Patient Instructions (Signed)
Chronic cough Likely related to reflux  We will start Protonix Obtain a breathing study  Refill medications-elevate head of bed, make sure you do not have any significant meals/drinks for about 3 hours prior to bedtime  We will see you back in about 4 to 6 weeks

## 2018-08-10 ENCOUNTER — Other Ambulatory Visit: Payer: Self-pay

## 2018-08-10 DIAGNOSIS — Z20822 Contact with and (suspected) exposure to covid-19: Secondary | ICD-10-CM

## 2018-08-11 ENCOUNTER — Other Ambulatory Visit: Payer: Self-pay | Admitting: Family Medicine

## 2018-08-12 LAB — NOVEL CORONAVIRUS, NAA: SARS-CoV-2, NAA: NOT DETECTED

## 2018-09-26 ENCOUNTER — Other Ambulatory Visit: Payer: Self-pay | Admitting: Pulmonary Disease

## 2018-10-04 ENCOUNTER — Other Ambulatory Visit: Payer: Self-pay | Admitting: Family Medicine

## 2018-10-04 DIAGNOSIS — F329 Major depressive disorder, single episode, unspecified: Secondary | ICD-10-CM

## 2018-10-04 DIAGNOSIS — F32A Depression, unspecified: Secondary | ICD-10-CM

## 2018-10-05 NOTE — Telephone Encounter (Signed)
[facility] is requesting to fill pt lexapro. Please advise .[place]

## 2018-10-06 ENCOUNTER — Telehealth: Payer: Self-pay | Admitting: Pulmonary Disease

## 2018-10-06 NOTE — Telephone Encounter (Signed)
Hold off on PFT

## 2018-10-06 NOTE — Telephone Encounter (Signed)
Dr. Ander Slade can we cancel PFT or do you recommend pt to still have PFT for baseline result? She is doing better on the pantoprazole.  Please advise.        Patient Instructions by Laurin Coder, MD at 08/03/2018 3:00 PM Author: Laurin Coder, MD Author Type: Physician Filed: 08/03/2018 3:37 PM  Note Status: Signed Cosign: Cosign Not Required Encounter Date: 08/03/2018  Editor: Laurin Coder, MD (Physician)    Chronic cough Likely related to reflux  We will start Protonix Obtain a breathing study  Refill medications-elevate head of bed, make sure you do not have any significant meals/drinks for about 3 hours prior to bedtime  We will see you back in about 4 to 6 weeks

## 2018-10-09 NOTE — Telephone Encounter (Signed)
Spoke with pt, aware of recs.  No PFT is ordered at this time.  Nothing further needed at this time- will close encounter.

## 2018-10-31 ENCOUNTER — Other Ambulatory Visit: Payer: Self-pay | Admitting: Pulmonary Disease

## 2018-11-08 ENCOUNTER — Other Ambulatory Visit: Payer: Self-pay | Admitting: Family Medicine

## 2018-11-21 ENCOUNTER — Other Ambulatory Visit: Payer: Self-pay

## 2018-11-21 ENCOUNTER — Ambulatory Visit: Payer: PRIVATE HEALTH INSURANCE | Admitting: Family Medicine

## 2018-11-21 ENCOUNTER — Encounter: Payer: Self-pay | Admitting: Family Medicine

## 2018-11-21 VITALS — BP 126/80 | HR 88 | Temp 97.8°F | Wt 246.6 lb

## 2018-11-21 DIAGNOSIS — E669 Obesity, unspecified: Secondary | ICD-10-CM | POA: Insufficient documentation

## 2018-11-21 DIAGNOSIS — M1711 Unilateral primary osteoarthritis, right knee: Secondary | ICD-10-CM | POA: Diagnosis not present

## 2018-11-21 DIAGNOSIS — I10 Essential (primary) hypertension: Secondary | ICD-10-CM

## 2018-11-21 DIAGNOSIS — Z9884 Bariatric surgery status: Secondary | ICD-10-CM

## 2018-11-21 DIAGNOSIS — K219 Gastro-esophageal reflux disease without esophagitis: Secondary | ICD-10-CM

## 2018-11-21 DIAGNOSIS — F341 Dysthymic disorder: Secondary | ICD-10-CM

## 2018-11-21 MED ORDER — ESCITALOPRAM OXALATE 5 MG PO TABS: 5.0000 mg | ORAL_TABLET | Freq: Every day | ORAL | 3 refills | Status: DC

## 2018-11-21 MED ORDER — LOSARTAN POTASSIUM-HCTZ 50-12.5 MG PO TABS: ORAL_TABLET | ORAL | 3 refills | Status: DC

## 2018-11-21 MED ORDER — PANTOPRAZOLE SODIUM 40 MG PO TBEC: 40.0000 mg | DELAYED_RELEASE_TABLET | Freq: Every day | ORAL | 1 refills | Status: DC

## 2018-11-21 NOTE — Progress Notes (Signed)
   Subjective:    Patient ID: Katherine Lewis, female    DOB: [birth year], 48 y.o.   MRN: [id number]  HPI She is here for an interval evaluation.  She is scheduled for a TKR in December.  She has been having a lot of difficulty with is causing pain interfering with her ADLs.  This is also kept her from being more physically active. Her weight seems to be fairly stable.  She has had previous gastric sleeve.  Hopefully with the knee surgery her physical activities will improve. Her reflux seems to be under good control.  She continues on Lexapro which helps keep her in a stable psychological situation.  She continues on her blood pressure medication and is having no difficulty with that.  Reflux seems to be under good control.  Review of Systems     Objective:   Physical Exam Alert and in no distress. Tympanic membranes and canals are normal. Pharyngeal area is normal. Neck is supple without adenopathy or thyromegaly. Cardiac exam shows a regular sinus rhythm without murmurs or gallops. Lungs are clear to auscultation.        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  Hopefully after surgery she can become more physically active.  Discussed rehab after the surgery in terms of range of motion exercises, scar revision.  Continue on present medications.

## 2018-11-22 ENCOUNTER — Other Ambulatory Visit: Payer: Self-pay | Admitting: Family Medicine

## 2018-11-22 DIAGNOSIS — E781 Pure hyperglyceridemia: Secondary | ICD-10-CM

## 2018-11-22 LAB — COMPREHENSIVE METABOLIC PANEL
ALT: 38 IU/L — ABNORMAL HIGH (ref 0–32)
AST: 31 IU/L (ref 0–40)
Albumin/Globulin Ratio: 1.6 (ref 1.2–2.2)
Albumin: 4.2 g/dL (ref 3.8–4.8)
Alkaline Phosphatase: 84 IU/L (ref 39–117)
BUN/Creatinine Ratio: 15 (ref 9–23)
BUN: 12 mg/dL (ref 6–24)
Bilirubin Total: 0.3 mg/dL (ref 0.0–1.2)
CO2: 25 mmol/L (ref 20–29)
Calcium: 9.7 mg/dL (ref 8.7–10.2)
Chloride: 99 mmol/L (ref 96–106)
Creatinine, Ser: 0.79 mg/dL (ref 0.57–1.00)
GFR calc Af Amer: 103 mL/min/{1.73_m2} (ref >59)
GFR calc non Af Amer: 89 mL/min/{1.73_m2} (ref >59)
Globulin, Total: 2.6 g/dL (ref 1.5–4.5)
Glucose: 100 mg/dL — ABNORMAL HIGH (ref 65–99)
Potassium: 3.8 mmol/L (ref 3.5–5.2)
Sodium: 139 mmol/L (ref 134–144)
Total Protein: 6.8 g/dL (ref 6.0–8.5)

## 2018-11-22 LAB — CBC WITH DIFFERENTIAL/PLATELET
Basophils Absolute: 0.1 10*3/uL (ref 0.0–0.2)
Basos: 1 %
EOS (ABSOLUTE): 0.1 10*3/uL (ref 0.0–0.4)
Eos: 2 %
Hematocrit: 44.2 % (ref 34.0–46.6)
Hemoglobin: 15.4 g/dL (ref 11.1–15.9)
Immature Grans (Abs): 0 10*3/uL (ref 0.0–0.1)
Immature Granulocytes: 0 %
Lymphocytes Absolute: 2.8 10*3/uL (ref 0.7–3.1)
Lymphs: 34 %
MCH: 32.3 pg (ref 26.6–33.0)
MCHC: 34.8 g/dL (ref 31.5–35.7)
MCV: 93 fL (ref 79–97)
Monocytes Absolute: 0.6 10*3/uL (ref 0.1–0.9)
Monocytes: 7 %
Neutrophils Absolute: 4.6 10*3/uL (ref 1.4–7.0)
Neutrophils: 56 %
Platelets: 268 10*3/uL (ref 150–450)
RBC: 4.77 x10E6/uL (ref 3.77–5.28)
RDW: 12.6 % (ref 11.7–15.4)
WBC: 8.2 10*3/uL (ref 3.4–10.8)

## 2018-11-22 LAB — LIPID PANEL
Chol/HDL Ratio: 5.8 ratio — ABNORMAL HIGH (ref 0.0–4.4)
Cholesterol, Total: 225 mg/dL — ABNORMAL HIGH (ref 100–199)
HDL: 39 mg/dL — ABNORMAL LOW (ref >39)
LDL Chol Calc (NIH): 108 mg/dL — ABNORMAL HIGH (ref 0–99)
Triglycerides: 451 mg/dL — ABNORMAL HIGH (ref 0–149)
VLDL Cholesterol Cal: 78 mg/dL — ABNORMAL HIGH (ref 5–40)

## 2018-11-23 ENCOUNTER — Encounter: Payer: PRIVATE HEALTH INSURANCE | Admitting: Family Medicine

## 2018-11-27 ENCOUNTER — Telehealth: Payer: Self-pay

## 2018-11-27 ENCOUNTER — Ambulatory Visit (INDEPENDENT_AMBULATORY_CARE_PROVIDER_SITE_OTHER): Payer: PRIVATE HEALTH INSURANCE | Admitting: Orthopedic Surgery

## 2018-11-27 ENCOUNTER — Other Ambulatory Visit: Payer: Self-pay

## 2018-11-27 ENCOUNTER — Ambulatory Visit (INDEPENDENT_AMBULATORY_CARE_PROVIDER_SITE_OTHER): Payer: PRIVATE HEALTH INSURANCE

## 2018-11-27 DIAGNOSIS — M79674 Pain in right toe(s): Secondary | ICD-10-CM

## 2018-11-27 NOTE — Telephone Encounter (Signed)
Called pt to advise of repeat lipids needed in about three month. LVM for pt to call back and schedule. [place]

## 2018-12-01 ENCOUNTER — Encounter: Payer: Self-pay | Admitting: Orthopedic Surgery

## 2018-12-01 NOTE — Progress Notes (Signed)
Office Visit Note   Patient: Katherine Lewis           Date of Birth: 20-May-1970           MRN: [id number] Visit Date: 11/27/2018 Requested by: Denita Lung, MD [place],  [place] [address] PCP: Denita Lung, MD  Subjective: Chief Complaint  Patient presents with  . 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  She has had problems with that right great toe fitting in shoes.  She wonders if something can be done at the same time.  Multiple questions about knee replacement are answered including risk and benefits rehabilitation time and pain management after the procedure.  All these questions are answered.  States that the knee pain has worsened.  She is having difficulty with ambulation.  She reports night pain and rest pain which is accelerating.  She does have good family support at home for recovery.  She wanted to try to manage the postop pain without any narcotics which I think would be extremely difficult.              ROS: All systems reviewed are negative as they relate to the chief complaint within the history of present illness.  Patient denies  fevers or chills.   Assessment & Plan: Visit Diagnoses:  1. Great toe pain, right     Plan: Impression is worsening knee arthritis with right total knee replacement pending.  All questions answered today.  Regarding that right great toe at all think it is a great idea to do the surgery at the same time.  I also do not think that you could do it before the surgery and have it healed up to diminish any type of proximal infection risk.  She is going to have this potentially revised about a year after her knee replacement surgery which I think would be the safest bet.  All questions answered.  We will see her back 2 weeks after surgery.   Follow-Up Instructions: No follow-ups on file.   Orders:  Orders Placed This Encounter  Procedures  . XR Toe Great Right   No orders of the defined types were placed in this encounter.     Procedures: No procedures performed   Clinical Data: No additional findings.  Objective: Vital Signs: There were no vitals taken for this visit.  Physical Exam:   Constitutional: Patient appears well-developed HEENT:  Head: Normocephalic Eyes:EOM are normal Neck: Normal range of motion Cardiovascular: Normal rate Pulmonary/chest: Effort normal Neurologic: Patient is alert Skin: Skin is warm Psychiatric: Patient has normal mood and affect    Ortho Exam: Ortho exam demonstrates full active and passive range of motion of the foot and ankle.  She does have apex anterior deformity of that small toe.  The fourth toe is also had similar surgery but appears to be better aligned.  Pedal pulses palpable in the foot.  Ankle dorsiflexion plantarflexion inversion eversion strength is intact.  No other masses lymphadenopathy or skin changes noted in that foot region.  The skin in the right knee region is intact.  Trace effusion present.  Patient has both medial and lateral joint line tenderness in this region.  Specialty Comments:  No specialty comments available.  Imaging: No results found.   PMFS History: Patient Active Problem  List   Diagnosis Date Noted  . Essential hypertension 11/21/2018  . Obesity (BMI 35.0-39.9 without comorbidity) 11/21/2018  . Dysthymia 11/21/2018  . IUD (intrauterine device) in place 12/19/2017  . Fatigue 12/19/2017  . Mild sleep apnea 12/19/2017  . Acne 12/19/2017  . Dysuria 12/19/2017  . 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 Chronic cough 04/03/2012   Past Medical History:  Diagnosis Date  . Anxiety   . Depression   . GERD (gastroesophageal reflux disease)   . Hypertension   . Hypothyroidism   . Insomnia   . Plantar fasciitis    . Sleep apnea    does not wear Cpap  . Vitamin D deficiency     Family History  Problem Relation Age of Onset  . Diabetes type II Mother   . Diabetes Mother   . Lung cancer Paternal Grandmother   . Lung cancer Paternal Grandfather     Past Surgical History:  Procedure Laterality Date  . LAPAROSCOPIC GASTRIC SLEEVE RESECTION N/A 04/23/2014   Procedure: LAPAROSCOPIC GASTRIC SLEEVE RESECTION WITH UPPER ENDOSCOPY;  Surgeon: Johnathan Hausen, MD;  Location: [facility];  Service: General;  Laterality: N/A;  . MYRINGOTOMY     bilateral    Social History   Occupational History  . Occupation: Science writer: [employer]  Tobacco Use  . Smoking status: Former Smoker    Packs/day: 0.50    Years: 6.00    Pack years: 3.00    Types: Cigarettes    Quit date: 01/19/1992    Years since quitting: 26.8  . Smokeless tobacco: Never Used  . Tobacco comment: social   Substance and Sexual Activity  . Alcohol use: Yes    Comment: social   . Drug use: No  . Sexual activity: Yes

## 2018-12-04 ENCOUNTER — Encounter: Payer: Self-pay | Admitting: Orthopedic Surgery

## 2018-12-11 ENCOUNTER — Other Ambulatory Visit: Payer: Self-pay

## 2018-12-20 ENCOUNTER — Other Ambulatory Visit: Payer: Self-pay

## 2018-12-20 DIAGNOSIS — Z20822 Contact with and (suspected) exposure to covid-19: Secondary | ICD-10-CM

## 2018-12-23 LAB — NOVEL CORONAVIRUS, NAA: SARS-CoV-2, NAA: NOT DETECTED

## 2018-12-25 NOTE — Progress Notes (Signed)
[place], [place] [address] Phone: [phone] Fax: [phone]      Your procedure is scheduled on January 02, 2019.  Report to [facility] Main Entrance "A" at 5:30 A.M., and check in at the Admitting office.  Call this number if you have problems the morning of surgery:  [phone]  Call [phone] if you have any questions prior to your surgery date Monday-Friday 8am-4pm    Remember:  Do not eat after midnight the night before your surgery  You may drink clear liquids until 4:15 A.M.  the morning of your surgery.   Clear liquids allowed are: Water, Non-Citrus Juices (without pulp), Carbonated Beverages, Clear Tea, Black Coffee Only, and Gatorade  Please complete your PRE-SURGERY ENSURE that was provided to you by 4:15 A.M.  the morning of surgery.  Please, if able, drink it in one setting. DO NOT SIP.    Take these medicines the morning of surgery with A SIP OF WATER: escitalopram (LEXAPRO) pantoprazole (PROTONIX)  7 days prior to surgery STOP taking any Aspirin (unless otherwise instructed by your surgeon), Aleve, Naproxen, Ibuprofen, Motrin, Advil, Goody's, BC's, all herbal medications, fish oil, and all vitamins.    The Morning of Surgery  Do not wear jewelry, make-up or nail polish.  Do not wear lotions, powders, or perfumes or deodorant  Do not shave 48 hours prior to surgery.    Do not bring valuables to the hospital.  [facility] is not responsible for any belongings or valuables.  If you are a smoker, DO NOT Smoke 24 hours prior to surgery  If you wear a CPAP at night please bring your mask, tubing, and machine the morning of surgery   Remember that you must have someone to transport you home after your surgery, and remain with you for 24 hours if you are discharged the same day.   Please bring cases for contacts, glasses, hearing aids, dentures or bridgework because  it cannot be worn into surgery.    Leave your suitcase in the car.  After surgery it may be brought to your room.  For patients admitted to the hospital, discharge time will be determined by your treatment team.  Patients discharged the day of surgery will not be allowed to drive home.    Special instructions:   [facility]- Preparing For Surgery  Before surgery, you can play an important role. Because skin is not sterile, your skin needs to be as free of germs as possible. You can reduce the number of germs on your skin by washing with CHG (chlorahexidine gluconate) Soap before surgery.  CHG is an antiseptic cleaner which kills germs and bonds with the skin to continue killing germs even after washing.    Oral Hygiene is also important to reduce your risk of infection.  Remember - BRUSH YOUR TEETH THE MORNING OF SURGERY WITH YOUR REGULAR TOOTHPASTE  Please do not use if you have an allergy to CHG or antibacterial soaps. If your skin becomes reddened/irritated stop using the CHG.  Do not shave (including legs and underarms) for at least 48 hours prior to first CHG shower. It is OK to shave your face.  Please follow these instructions carefully.   1. Shower the NIGHT BEFORE SURGERY and the MORNING OF SURGERY with CHG Soap.   2. If you chose to wash your hair, wash your hair first as usual with your normal shampoo.  3. After you shampoo, rinse your hair and body thoroughly to remove the shampoo.  4. Use CHG as you would any other liquid soap. You can apply CHG directly to the skin and wash gently with a scrungie or a clean washcloth.   5. Apply the CHG Soap to your body ONLY FROM THE NECK DOWN.  Do not use on open wounds or open sores. Avoid contact with your eyes, ears, mouth and genitals (private parts). Wash Face and genitals (private parts)  with your normal soap.   6. Wash thoroughly, paying special attention to the area where your surgery will be performed.  7. Thoroughly rinse  your body with warm water from the neck down.  8. DO NOT shower/wash with your normal soap after using and rinsing off the CHG Soap.  9. Pat yourself dry with a CLEAN TOWEL.  10. Wear CLEAN PAJAMAS to bed the night before surgery, wear comfortable clothes the morning of surgery  11. Place CLEAN SHEETS on your bed the night of your first shower and DO NOT SLEEP WITH PETS.    Day of Surgery:  Please shower the morning of surgery with the CHG soap Do not apply any deodorants/lotions. Please wear clean clothes to the hospital/surgery center.   Remember to brush your teeth WITH YOUR REGULAR TOOTHPASTE.   Please read over the following fact sheets that you were given.

## 2018-12-26 ENCOUNTER — Encounter (HOSPITAL_COMMUNITY): Payer: Self-pay

## 2018-12-26 ENCOUNTER — Encounter (HOSPITAL_COMMUNITY)
Admission: RE | Admit: 2018-12-26 | Discharge: 2018-12-26 | Disposition: A | Discharge status: Home / Self Care | Payer: PRIVATE HEALTH INSURANCE | Source: Ambulatory Visit | Attending: Orthopedic Surgery | Admitting: Orthopedic Surgery

## 2018-12-26 ENCOUNTER — Other Ambulatory Visit: Payer: Self-pay

## 2018-12-26 DIAGNOSIS — Z01818 Encounter for other preprocedural examination: Secondary | ICD-10-CM | POA: Insufficient documentation

## 2018-12-26 DIAGNOSIS — I10 Essential (primary) hypertension: Secondary | ICD-10-CM | POA: Insufficient documentation

## 2018-12-26 LAB — CBC
HCT: 43.5 % (ref 36.0–46.0)
Hemoglobin: 15 g/dL (ref 12.0–15.0)
MCH: 31.9 pg (ref 26.0–34.0)
MCHC: 34.5 g/dL (ref 30.0–36.0)
MCV: 92.6 fL (ref 80.0–100.0)
Platelets: 230 10*3/uL (ref 150–400)
RBC: 4.7 MIL/uL (ref 3.87–5.11)
RDW: 12 % (ref 11.5–15.5)
WBC: 8 10*3/uL (ref 4.0–10.5)
nRBC: 0 % (ref 0.0–0.2)

## 2018-12-26 LAB — BASIC METABOLIC PANEL
Anion gap: 8 (ref 5–15)
BUN: 10 mg/dL (ref 6–20)
CO2: 28 mmol/L (ref 22–32)
Calcium: 9.2 mg/dL (ref 8.9–10.3)
Chloride: 103 mmol/L (ref 98–111)
Creatinine, Ser: 0.65 mg/dL (ref 0.44–1.00)
GFR calc Af Amer: >60 mL/min (ref >60)
GFR calc non Af Amer: >60 mL/min (ref >60)
Glucose, Bld: 94 mg/dL (ref 70–99)
Potassium: 3.8 mmol/L (ref 3.5–5.1)
Sodium: 139 mmol/L (ref 135–145)

## 2018-12-26 LAB — URINALYSIS, ROUTINE W REFLEX MICROSCOPIC
Bilirubin Urine: NEGATIVE
Glucose, UA: NEGATIVE mg/dL
Hgb urine dipstick: NEGATIVE
Ketones, ur: NEGATIVE mg/dL
Leukocytes,Ua: NEGATIVE
Nitrite: NEGATIVE
Protein, ur: NEGATIVE mg/dL
Specific Gravity, Urine: 1.021 (ref 1.005–1.030)
pH: 5 (ref 5.0–8.0)

## 2018-12-26 LAB — SURGICAL PCR SCREEN
MRSA, PCR: NEGATIVE
Staphylococcus aureus: NEGATIVE

## 2018-12-26 NOTE — Progress Notes (Signed)
PCP:  Dr. Jill Alexanders, MD Cardiologist:  denies  EKG:  12/26/18 CXR:  N/A ECHO:  denies Stress Test:  denies Cardiac Cath:  denies  Covid testing 12/29/18  Patient denies shortness of breath, fever, cough, and chest pain at PAT appointment.  Patient verbalized understanding of instructions provided today at the PAT appointment.  Patient asked to review instructions at home and day of surgery.

## 2018-12-27 LAB — URINE CULTURE: Culture: NO GROWTH

## 2018-12-28 ENCOUNTER — Encounter: Payer: Self-pay | Admitting: Orthopedic Surgery

## 2018-12-29 ENCOUNTER — Other Ambulatory Visit (HOSPITAL_COMMUNITY)
Admission: RE | Admit: 2018-12-29 | Discharge: 2018-12-29 | Disposition: A | Discharge status: Home / Self Care | Payer: PRIVATE HEALTH INSURANCE | Source: Ambulatory Visit | Attending: Orthopedic Surgery | Admitting: Orthopedic Surgery

## 2018-12-29 DIAGNOSIS — Z20828 Contact with and (suspected) exposure to other viral communicable diseases: Secondary | ICD-10-CM | POA: Insufficient documentation

## 2018-12-29 DIAGNOSIS — Z01812 Encounter for preprocedural laboratory examination: Secondary | ICD-10-CM | POA: Insufficient documentation

## 2018-12-30 LAB — NOVEL CORONAVIRUS, NAA (HOSP ORDER, SEND-OUT TO REF LAB; TAT 18-24 HRS): SARS-CoV-2, NAA: NOT DETECTED

## 2018-12-31 ENCOUNTER — Other Ambulatory Visit: Payer: Self-pay | Admitting: Medical

## 2019-01-01 ENCOUNTER — Telehealth: Payer: Self-pay

## 2019-01-01 MED ORDER — MINOCYCLINE HCL 100 MG PO TABS: 100.0000 mg | ORAL_TABLET | Freq: Two times a day (BID) | ORAL | 2 refills | Status: DC

## 2019-01-01 NOTE — Telephone Encounter (Signed)
Pt is I need of refill of minocycline 100 mg  bid. Pt say it is filled by dermatology in the past but we have filled if she needed for acute dermatitis. Pt is also requesting a diflucan as well. Please advise if med will be sent into [facility] . Thanks [employer]

## 2019-01-01 NOTE — Addendum Note (Signed)
Addended by: Denita Lung on: 01/01/2019 03:48 PM   Modules accepted: Orders

## 2019-01-02 ENCOUNTER — Encounter (HOSPITAL_COMMUNITY)
Admission: RE | Disposition: A | Discharge status: Home Health | Payer: Self-pay | Source: Home / Self Care | Attending: Orthopedic Surgery

## 2019-01-02 ENCOUNTER — Inpatient Hospital Stay (HOSPITAL_COMMUNITY): Payer: No Typology Code available for payment source | Admitting: Anesthesiology

## 2019-01-02 ENCOUNTER — Encounter (HOSPITAL_COMMUNITY): Payer: Self-pay | Admitting: Orthopedic Surgery

## 2019-01-02 ENCOUNTER — Other Ambulatory Visit: Payer: Self-pay

## 2019-01-02 ENCOUNTER — Inpatient Hospital Stay (HOSPITAL_COMMUNITY)
Admission: RE | Admit: 2019-01-02 | Discharge: 2019-01-04 | DRG: 470 | Disposition: A | Discharge status: Home Health | Payer: No Typology Code available for payment source | Attending: Orthopedic Surgery | Admitting: Orthopedic Surgery

## 2019-01-02 DIAGNOSIS — E559 Vitamin D deficiency, unspecified: Secondary | ICD-10-CM | POA: Diagnosis present

## 2019-01-02 DIAGNOSIS — E669 Obesity, unspecified: Secondary | ICD-10-CM | POA: Diagnosis present

## 2019-01-02 DIAGNOSIS — M1711 Unilateral primary osteoarthritis, right knee: Principal | ICD-10-CM | POA: Diagnosis present

## 2019-01-02 DIAGNOSIS — E039 Hypothyroidism, unspecified: Secondary | ICD-10-CM | POA: Diagnosis present

## 2019-01-02 DIAGNOSIS — M722 Plantar fascial fibromatosis: Secondary | ICD-10-CM | POA: Diagnosis present

## 2019-01-02 DIAGNOSIS — Z975 Presence of (intrauterine) contraceptive device: Secondary | ICD-10-CM | POA: Diagnosis not present

## 2019-01-02 DIAGNOSIS — Z87891 Personal history of nicotine dependence: Secondary | ICD-10-CM | POA: Diagnosis not present

## 2019-01-02 DIAGNOSIS — I10 Essential (primary) hypertension: Secondary | ICD-10-CM | POA: Diagnosis present

## 2019-01-02 DIAGNOSIS — F419 Anxiety disorder, unspecified: Secondary | ICD-10-CM | POA: Diagnosis present

## 2019-01-02 DIAGNOSIS — R05 Cough: Secondary | ICD-10-CM | POA: Diagnosis present

## 2019-01-02 DIAGNOSIS — G4730 Sleep apnea, unspecified: Secondary | ICD-10-CM | POA: Diagnosis present

## 2019-01-02 DIAGNOSIS — Z96659 Presence of unspecified artificial knee joint: Secondary | ICD-10-CM | POA: Diagnosis present

## 2019-01-02 DIAGNOSIS — Z885 Allergy status to narcotic agent status: Secondary | ICD-10-CM | POA: Diagnosis not present

## 2019-01-02 DIAGNOSIS — M25561 Pain in right knee: Secondary | ICD-10-CM | POA: Diagnosis present

## 2019-01-02 DIAGNOSIS — K219 Gastro-esophageal reflux disease without esophagitis: Secondary | ICD-10-CM | POA: Diagnosis present

## 2019-01-02 DIAGNOSIS — Z6839 Body mass index (BMI) 39.0-39.9, adult: Secondary | ICD-10-CM | POA: Diagnosis not present

## 2019-01-02 HISTORY — PX: TOTAL KNEE ARTHROPLASTY: SHX125

## 2019-01-02 LAB — POCT PREGNANCY, URINE: Preg Test, Ur: NEGATIVE

## 2019-01-02 SURGERY — ARTHROPLASTY, KNEE, TOTAL
Anesthesia: Monitor Anesthesia Care | Site: Knee | Laterality: Right

## 2019-01-02 MED ORDER — ASPIRIN 81 MG PO CHEW
81.0000 mg | CHEWABLE_TABLET | Freq: Every day | ORAL | Status: DC
  Administered 2019-01-02 – 2019-01-03 (×2): 81 mg via ORAL
  Filled 2019-01-02 (×2): qty 1

## 2019-01-02 MED ORDER — TRAMADOL HCL 50 MG PO TABS
50.0000 mg | ORAL_TABLET | Freq: Four times a day (QID) | ORAL | Status: DC
  Administered 2019-01-02 – 2019-01-04 (×7): 50 mg via ORAL
  Filled 2019-01-02 (×7): qty 1

## 2019-01-02 MED ORDER — IRRISEPT - 450ML BOTTLE WITH 0.05% CHG IN STERILE WATER, USP 99.95% OPTIME
TOPICAL | Status: DC | PRN
  Administered 2019-01-02: 450 mL

## 2019-01-02 MED ORDER — SODIUM CHLORIDE 0.9 % IR SOLN
Status: DC | PRN
  Administered 2019-01-02: 3000 mL

## 2019-01-02 MED ORDER — MENTHOL 3 MG MT LOZG: 1.0000 | LOZENGE | OROMUCOSAL | Status: DC | PRN

## 2019-01-02 MED ORDER — OXYCODONE HCL 5 MG PO TABS
5.0000 mg | ORAL_TABLET | ORAL | Status: DC | PRN
  Administered 2019-01-02: 10 mg via ORAL
  Administered 2019-01-02: 5 mg via ORAL
  Administered 2019-01-03 (×2): 10 mg via ORAL
  Administered 2019-01-03: 5 mg via ORAL
  Administered 2019-01-03 – 2019-01-04 (×4): 10 mg via ORAL
  Filled 2019-01-02 (×2): qty 2
  Filled 2019-01-02: qty 1
  Filled 2019-01-02 (×3): qty 2
  Filled 2019-01-02: qty 1
  Filled 2019-01-02: qty 2

## 2019-01-02 MED ORDER — LOSARTAN POTASSIUM 50 MG PO TABS
50.0000 mg | ORAL_TABLET | Freq: Every day | ORAL | Status: DC
  Administered 2019-01-02 – 2019-01-03 (×2): 50 mg via ORAL
  Filled 2019-01-02 (×2): qty 1

## 2019-01-02 MED ORDER — HYDROCHLOROTHIAZIDE 12.5 MG PO CAPS
12.5000 mg | ORAL_CAPSULE | Freq: Every day | ORAL | Status: DC
  Administered 2019-01-02 – 2019-01-03 (×2): 12.5 mg via ORAL
  Filled 2019-01-02 (×2): qty 1

## 2019-01-02 MED ORDER — MIDAZOLAM HCL 5 MG/5ML IJ SOLN
INTRAMUSCULAR | Status: DC | PRN
  Administered 2019-01-02: 1 mg via INTRAVENOUS
  Administered 2019-01-02: 2 mg via INTRAVENOUS

## 2019-01-02 MED ORDER — OXYCODONE HCL 5 MG PO TABS
ORAL_TABLET | ORAL | Status: AC
  Filled 2019-01-02: qty 2

## 2019-01-02 MED ORDER — TRANEXAMIC ACID 1000 MG/10ML IV SOLN
2000.0000 mg | INTRAVENOUS | Status: AC
  Administered 2019-01-02: 08:00:00 2000 mg via TOPICAL
  Filled 2019-01-02: qty 20

## 2019-01-02 MED ORDER — ACETAMINOPHEN 325 MG PO TABS
325.0000 mg | ORAL_TABLET | Freq: Four times a day (QID) | ORAL | Status: DC | PRN
  Administered 2019-01-02: 325 mg via ORAL
  Administered 2019-01-04: 650 mg via ORAL
  Filled 2019-01-02 (×3): qty 2

## 2019-01-02 MED ORDER — ONDANSETRON HCL 4 MG/2ML IJ SOLN: 4.0000 mg | Freq: Four times a day (QID) | INTRAMUSCULAR | Status: DC | PRN

## 2019-01-02 MED ORDER — METOCLOPRAMIDE HCL 5 MG PO TABS: 5.0000 mg | ORAL_TABLET | Freq: Three times a day (TID) | ORAL | Status: DC | PRN

## 2019-01-02 MED ORDER — DOCUSATE SODIUM 100 MG PO CAPS
100.0000 mg | ORAL_CAPSULE | Freq: Two times a day (BID) | ORAL | Status: DC
  Administered 2019-01-02 – 2019-01-03 (×4): 100 mg via ORAL
  Filled 2019-01-02 (×4): qty 1

## 2019-01-02 MED ORDER — CHLORHEXIDINE GLUCONATE 4 % EX LIQD: 60.0000 mL | Freq: Once | CUTANEOUS | Status: DC

## 2019-01-02 MED ORDER — BUPIVACAINE IN DEXTROSE 0.75-8.25 % IT SOLN
INTRATHECAL | Status: DC | PRN
  Administered 2019-01-02: 2 mL via INTRATHECAL

## 2019-01-02 MED ORDER — PROPOFOL 500 MG/50ML IV EMUL
INTRAVENOUS | Status: DC | PRN
  Administered 2019-01-02 (×2): 100 ug/kg/min via INTRAVENOUS

## 2019-01-02 MED ORDER — SODIUM CHLORIDE 0.9% FLUSH
INTRAVENOUS | Status: DC | PRN
  Administered 2019-01-02: 20 mL

## 2019-01-02 MED ORDER — ONDANSETRON HCL 4 MG/2ML IJ SOLN
INTRAMUSCULAR | Status: DC | PRN
  Administered 2019-01-02: 4 mg via INTRAVENOUS

## 2019-01-02 MED ORDER — TRANEXAMIC ACID-NACL 1000-0.7 MG/100ML-% IV SOLN
INTRAVENOUS | Status: AC
  Filled 2019-01-02: qty 100

## 2019-01-02 MED ORDER — PROPOFOL 10 MG/ML IV BOLUS
INTRAVENOUS | Status: DC | PRN
  Administered 2019-01-02 (×2): 30 mg via INTRAVENOUS
  Administered 2019-01-02: 20 mg via INTRAVENOUS

## 2019-01-02 MED ORDER — METOCLOPRAMIDE HCL 5 MG/ML IJ SOLN: 5.0000 mg | Freq: Three times a day (TID) | INTRAMUSCULAR | Status: DC | PRN

## 2019-01-02 MED ORDER — CEFAZOLIN SODIUM-DEXTROSE 2-4 GM/100ML-% IV SOLN
2.0000 g | INTRAVENOUS | Status: AC
  Administered 2019-01-02: 08:00:00 2 g via INTRAVENOUS
  Filled 2019-01-02: qty 100

## 2019-01-02 MED ORDER — TRANEXAMIC ACID-NACL 1000-0.7 MG/100ML-% IV SOLN
1000.0000 mg | INTRAVENOUS | Status: AC
  Administered 2019-01-02: 08:00:00 1000 mg via INTRAVENOUS
  Filled 2019-01-02: qty 100

## 2019-01-02 MED ORDER — METHOCARBAMOL 1000 MG/10ML IJ SOLN
500.0000 mg | Freq: Four times a day (QID) | INTRAVENOUS | Status: DC | PRN
  Filled 2019-01-02: qty 5

## 2019-01-02 MED ORDER — BUPIVACAINE HCL 0.25 % IJ SOLN
INTRAMUSCULAR | Status: DC | PRN
  Administered 2019-01-02: 30 mL

## 2019-01-02 MED ORDER — HYDROMORPHONE HCL 1 MG/ML IJ SOLN
0.5000 mg | INTRAMUSCULAR | Status: DC | PRN
  Administered 2019-01-02 – 2019-01-03 (×3): 0.5 mg via INTRAVENOUS
  Filled 2019-01-02 (×5): qty 1

## 2019-01-02 MED ORDER — LACTATED RINGERS IV SOLN: INTRAVENOUS | Status: DC

## 2019-01-02 MED ORDER — ESCITALOPRAM OXALATE 10 MG PO TABS
5.0000 mg | ORAL_TABLET | Freq: Every day | ORAL | Status: DC
  Administered 2019-01-03: 5 mg via ORAL
  Filled 2019-01-02 (×2): qty 1

## 2019-01-02 MED ORDER — ONDANSETRON HCL 4 MG PO TABS: 4.0000 mg | ORAL_TABLET | Freq: Four times a day (QID) | ORAL | Status: DC | PRN

## 2019-01-02 MED ORDER — CLONIDINE HCL (ANALGESIA) 100 MCG/ML EP SOLN
EPIDURAL | Status: DC | PRN
  Administered 2019-01-02: 1 mL

## 2019-01-02 MED ORDER — CLONIDINE HCL (ANALGESIA) 100 MCG/ML EP SOLN
EPIDURAL | Status: AC
  Filled 2019-01-02: qty 10

## 2019-01-02 MED ORDER — LACTATED RINGERS IV SOLN: INTRAVENOUS | Status: DC | PRN

## 2019-01-02 MED ORDER — PROPOFOL 1000 MG/100ML IV EMUL
INTRAVENOUS | Status: AC
  Filled 2019-01-02: qty 100

## 2019-01-02 MED ORDER — CEFAZOLIN SODIUM-DEXTROSE 2-4 GM/100ML-% IV SOLN
2.0000 g | Freq: Four times a day (QID) | INTRAVENOUS | Status: AC
  Administered 2019-01-02 (×2): 2 g via INTRAVENOUS
  Filled 2019-01-02 (×2): qty 100

## 2019-01-02 MED ORDER — FENTANYL CITRATE (PF) 100 MCG/2ML IJ SOLN
INTRAMUSCULAR | Status: DC | PRN
  Administered 2019-01-02: 50 ug via INTRAVENOUS
  Administered 2019-01-02: 100 ug via INTRAVENOUS

## 2019-01-02 MED ORDER — BUPIVACAINE HCL (PF) 0.25 % IJ SOLN
INTRAMUSCULAR | Status: AC
  Filled 2019-01-02: qty 30

## 2019-01-02 MED ORDER — MORPHINE SULFATE (PF) 4 MG/ML IV SOLN
INTRAVENOUS | Status: AC
  Filled 2019-01-02: qty 2

## 2019-01-02 MED ORDER — BUPIVACAINE LIPOSOME 1.3 % IJ SUSP
20.0000 mL | INTRAMUSCULAR | Status: AC
  Administered 2019-01-02: 20 mL
  Filled 2019-01-02: qty 20

## 2019-01-02 MED ORDER — PANTOPRAZOLE SODIUM 40 MG PO TBEC
40.0000 mg | DELAYED_RELEASE_TABLET | Freq: Every day | ORAL | Status: DC
  Administered 2019-01-03: 40 mg via ORAL
  Filled 2019-01-02: qty 1

## 2019-01-02 MED ORDER — LOSARTAN POTASSIUM-HCTZ 50-12.5 MG PO TABS: 0.5000 | ORAL_TABLET | Freq: Every day | ORAL | Status: DC

## 2019-01-02 MED ORDER — MIDAZOLAM HCL 2 MG/2ML IJ SOLN
INTRAMUSCULAR | Status: AC
  Filled 2019-01-02: qty 2

## 2019-01-02 MED ORDER — CELECOXIB 200 MG PO CAPS
200.0000 mg | ORAL_CAPSULE | Freq: Two times a day (BID) | ORAL | Status: DC
  Administered 2019-01-02 – 2019-01-03 (×4): 200 mg via ORAL
  Filled 2019-01-02 (×4): qty 1

## 2019-01-02 MED ORDER — FENTANYL CITRATE (PF) 250 MCG/5ML IJ SOLN
INTRAMUSCULAR | Status: AC
  Filled 2019-01-02: qty 5

## 2019-01-02 MED ORDER — ROPIVACAINE HCL 7.5 MG/ML IJ SOLN
INTRAMUSCULAR | Status: DC | PRN
  Administered 2019-01-02: 20 mL via PERINEURAL

## 2019-01-02 MED ORDER — METHOCARBAMOL 500 MG PO TABS
500.0000 mg | ORAL_TABLET | Freq: Four times a day (QID) | ORAL | Status: DC | PRN
  Administered 2019-01-02 – 2019-01-04 (×7): 500 mg via ORAL
  Filled 2019-01-02 (×7): qty 1

## 2019-01-02 MED ORDER — PHENOL 1.4 % MT LIQD: 1.0000 | OROMUCOSAL | Status: DC | PRN

## 2019-01-02 MED ORDER — POVIDONE-IODINE 10 % EX SWAB: 2.0000 "application " | Freq: Once | CUTANEOUS | Status: DC

## 2019-01-02 MED ORDER — 0.9 % SODIUM CHLORIDE (POUR BTL) OPTIME
TOPICAL | Status: DC | PRN
  Administered 2019-01-02 (×3): 1000 mL

## 2019-01-02 SURGICAL SUPPLY — 79 items
BAG DECANTER FOR FLEXI CONT (MISCELLANEOUS) ×3 IMPLANT
BANDAGE ESMARK 6X9 LF (GAUZE/BANDAGES/DRESSINGS) ×1 IMPLANT
BLADE SAG 18X100X1.27 (BLADE) ×3 IMPLANT
BNDG CMPR 9X6 STRL LF SNTH (GAUZE/BANDAGES/DRESSINGS) ×1
BNDG CMPR MED 10X6 ELC LF (GAUZE/BANDAGES/DRESSINGS) ×1
BNDG CMPR MED 15X6 ELC VLCR LF (GAUZE/BANDAGES/DRESSINGS) ×1
BNDG COHESIVE 6X5 TAN STRL LF (GAUZE/BANDAGES/DRESSINGS) ×3 IMPLANT
BNDG ELASTIC 4X5.8 VLCR STR LF (GAUZE/BANDAGES/DRESSINGS) ×2 IMPLANT
BNDG ELASTIC 6X10 VLCR STRL LF (GAUZE/BANDAGES/DRESSINGS) ×2 IMPLANT
BNDG ELASTIC 6X15 VLCR STRL LF (GAUZE/BANDAGES/DRESSINGS) ×3 IMPLANT
BNDG ESMARK 6X9 LF (GAUZE/BANDAGES/DRESSINGS) ×3
BOWL SMART MIX CTS (DISPOSABLE) IMPLANT
BSPLAT TIB 4 KN TRITANIUM (Knees) ×1 IMPLANT
CLOSURE STERI-STRIP 1/2X4 (GAUZE/BANDAGES/DRESSINGS) ×1
CLOSURE WOUND 1/2 X4 (GAUZE/BANDAGES/DRESSINGS) ×2
CLSR STERI-STRIP ANTIMIC 1/2X4 (GAUZE/BANDAGES/DRESSINGS) ×1 IMPLANT
CONT SPEC 4OZ CLIKSEAL STRL BL (MISCELLANEOUS) ×3 IMPLANT
COVER SURGICAL LIGHT HANDLE (MISCELLANEOUS) ×3 IMPLANT
COVER WAND RF STERILE (DRAPES) ×1 IMPLANT
CUFF TOURN SGL QUICK 34 (TOURNIQUET CUFF) ×3
CUFF TRNQT CYL 34X4.125X (TOURNIQUET CUFF) ×1 IMPLANT
DECANTER SPIKE VIAL GLASS SM (MISCELLANEOUS) ×3 IMPLANT
DRAPE ORTHO SPLIT 77X108 STRL (DRAPES) ×9
DRAPE SURG ORHT 6 SPLT 77X108 (DRAPES) ×3 IMPLANT
DRAPE U-SHAPE 47X51 STRL (DRAPES) ×3 IMPLANT
DRSG AQUACEL AG ADV 3.5X10 (GAUZE/BANDAGES/DRESSINGS) ×2 IMPLANT
DRSG AQUACEL AG ADV 3.5X14 (GAUZE/BANDAGES/DRESSINGS) IMPLANT
DURAPREP 26ML APPLICATOR (WOUND CARE) ×8 IMPLANT
ELECT CAUTERY BLADE 6.4 (BLADE) ×3 IMPLANT
ELECT REM PT RETURN 9FT ADLT (ELECTROSURGICAL) ×3
ELECTRODE REM PT RTRN 9FT ADLT (ELECTROSURGICAL) ×1 IMPLANT
GAUZE SPONGE 4X4 12PLY STRL (GAUZE/BANDAGES/DRESSINGS) ×3 IMPLANT
GAUZE SPONGE 4X4 12PLY STRL LF (GAUZE/BANDAGES/DRESSINGS) ×2 IMPLANT
GLOVE BIOGEL PI IND STRL 7.0 (GLOVE) ×1 IMPLANT
GLOVE BIOGEL PI IND STRL 8 (GLOVE) ×1 IMPLANT
GLOVE BIOGEL PI INDICATOR 7.0 (GLOVE) ×6
GLOVE BIOGEL PI INDICATOR 8 (GLOVE) ×4
GLOVE ECLIPSE 7.0 STRL STRAW (GLOVE) ×3 IMPLANT
GLOVE ECLIPSE 8.0 STRL XLNG CF (GLOVE) ×5 IMPLANT
GOWN STRL REUS W/ TWL LRG LVL3 (GOWN DISPOSABLE) ×3 IMPLANT
GOWN STRL REUS W/TWL LRG LVL3 (GOWN DISPOSABLE) ×9
HANDPIECE INTERPULSE COAX TIP (DISPOSABLE) ×3
HOOD PEEL AWAY FLYTE STAYCOOL (MISCELLANEOUS) ×9 IMPLANT
IMMOBILIZER KNEE 20 (SOFTGOODS)
IMMOBILIZER KNEE 20 THIGH 36 (SOFTGOODS) IMPLANT
IMMOBILIZER KNEE 22 UNIV (SOFTGOODS) ×2 IMPLANT
INSERT TRIATH X3 SZ4 9 (Insert) ×2 IMPLANT
KIT BASIN OR (CUSTOM PROCEDURE TRAY) ×3 IMPLANT
KIT TURNOVER KIT B (KITS) ×3 IMPLANT
KNEE FEMORAL COMP RT RETAIN (Knees) ×2 IMPLANT
KNEE PATELLA ASYMMETRIC 10X32 (Knees) ×2 IMPLANT
KNEE TIBIAL COMP TRI SZ4 (Knees) ×2 IMPLANT
MANIFOLD NEPTUNE II (INSTRUMENTS) ×3 IMPLANT
NDL SPNL 18GX3.5 QUINCKE PK (NEEDLE) ×1 IMPLANT
NEEDLE 22X1 1/2 (OR ONLY) (NEEDLE) ×6 IMPLANT
NEEDLE SPNL 18GX3.5 QUINCKE PK (NEEDLE) ×3 IMPLANT
NS IRRIG 1000ML POUR BTL (IV SOLUTION) ×6 IMPLANT
PACK TOTAL JOINT (CUSTOM PROCEDURE TRAY) ×3 IMPLANT
PAD ARMBOARD 7.5X6 YLW CONV (MISCELLANEOUS) ×6 IMPLANT
PAD CAST 4YDX4 CTTN HI CHSV (CAST SUPPLIES) ×1 IMPLANT
PADDING CAST COTTON 4X4 STRL (CAST SUPPLIES) ×3
PADDING CAST COTTON 6X4 STRL (CAST SUPPLIES) ×3 IMPLANT
SET HNDPC FAN SPRY TIP SCT (DISPOSABLE) ×1 IMPLANT
STRIP CLOSURE SKIN 1/2X4 (GAUZE/BANDAGES/DRESSINGS) ×4 IMPLANT
SUCTION FRAZIER HANDLE 10FR (MISCELLANEOUS) ×2
SUCTION TUBE FRAZIER 10FR DISP (MISCELLANEOUS) ×1 IMPLANT
SUT MNCRL AB 3-0 PS2 18 (SUTURE) ×3 IMPLANT
SUT VIC AB 0 CT1 27 (SUTURE) ×12
SUT VIC AB 0 CT1 27XBRD ANBCTR (SUTURE) ×3 IMPLANT
SUT VIC AB 1 CT1 27 (SUTURE) ×15
SUT VIC AB 1 CT1 27XBRD ANBCTR (SUTURE) ×5 IMPLANT
SUT VIC AB 2-0 CT1 27 (SUTURE) ×9
SUT VIC AB 2-0 CT1 TAPERPNT 27 (SUTURE) ×4 IMPLANT
SYR 30ML LL (SYRINGE) ×9 IMPLANT
SYR TB 1ML LUER SLIP (SYRINGE) ×3 IMPLANT
TOWEL GREEN STERILE (TOWEL DISPOSABLE) ×6 IMPLANT
TOWEL GREEN STERILE FF (TOWEL DISPOSABLE) ×6 IMPLANT
TRAY CATH 16FR W/PLASTIC CATH (SET/KITS/TRAYS/PACK) ×2 IMPLANT
WATER STERILE IRR 1000ML POUR (IV SOLUTION) IMPLANT

## 2019-01-02 NOTE — Brief Op Note (Signed)
01/02/2019 a  01/02/2019  10:07 AM  PATIENT:  Katherine Lewis  48 y.o. female  PRE-OPERATIVE DIAGNOSIS:  right knee osteoarthritis  POST-OPERATIVE DIAGNOSIS:  right knee osteoarthritis  PROCEDURE:  Procedure(s): RIGHT TOTAL KNEE ARTHROPLASTY  SURGEON:  Surgeon(s): Meredith Pel, MD  ASSISTANT: Magnant pa  ANESTHESIA:   spinal  EBL: 100 ml    Total I/O In: 1000 [I.V.:1000] Out: 250 [Urine:200; Blood:50]  BLOOD ADMINISTERED: none  DRAINS: none   LOCAL MEDICATIONS USED: Marcaine morphine clonidine Exparel  SPECIMEN:  No Specimen  COUNTS:  YES  TOURNIQUET:   Total Tourniquet Time Documented: area (laterality) - 74 minutes Total: area (laterality) - 74 minutes   DICTATION: .Other Dictation: Dictation Number [id number]  PLAN OF CARE: Admit for overnight observation  PATIENT DISPOSITION:  PACU - hemodynamically stable

## 2019-01-02 NOTE — Op Note (Signed)
NAMEAudrey, [facility] MEDICAL RECORD [id number] ACCOUNT [id number] DATE OF BIRTH:[birth year] FACILITY: [facility] LOCATION: [id number] PHYSICIAN:Dene Landsberg Randel Pigg, MD  OPERATIVE REPORT  DATE OF PROCEDURE:  01/02/2019  PREOPERATIVE DIAGNOSIS:  Right knee arthritis.  POSTOPERATIVE DIAGNOSIS:  Right knee arthritis.  PROCEDURE:  Right total knee replacement using Stryker Triathlon press fit size 4 femur, posterior cruciate retaining size 4 tibia with 9 mm polyethylene insert and 32 mm 3-peg press-fit patella.  SURGEON:  Meredith Pel, MD  ASSISTANT:  Annie Main PA.  INDICATIONS:  The patient is a 48 year old patient with end-stage right knee arthritis, who presents for operative management after explanation of risks and benefits.  PROCEDURE IN DETAIL:  The patient was brought to the operating room where spinal anesthetic was induced.  Preoperative antibiotics were administered.  Timeout was called.  Right leg prescrubbed with alcohol and Betadine, allowed to air dry, prepped with  DuraPrep solution and draped in a sterile manner.  Ioban used to cover the operative field.  Leg was elevated  and exsanguinated with the Esmarch wrap.  Tourniquet was inflated to 300 mmHg,.  Total tourniquet time was 74 minutes.  Anterior approach to  knee was made.  Skin and subcutaneous tissue were sharply divided.  Median parapatellar approach was made and marked with a #1 Vicryl suture.  Lateral patellofemoral ligament was released.  Soft tissue was then removed from the anterior distal femur.   ACL was released.  The lateral horn of the meniscus was released.  Fat pad partially excised.  Minimal medial soft tissue dissection was performed.  Intramedullary alignment used to make a cut on the tibia perpendicular to the mechanical axis, with more  being taken from the lateral side.  Collaterals and posterior neurovascular structures were protected.  Initial cut 9 mm off the least-affected lateral tibial plateau which  was revised to 2 more millimeters.  An 8 mm cut then made off the distal femur  using intramedullary alignment.  With a 9 mm spacer block, gave full extension as well as good stability to varus and valgus stress at 0 and 30 degrees.  The femur was then sized to a size 4 and the anterior, posterior and chamfer cuts were made.   Flexion and extension gaps were symmetric.  At this time, the femur was prepared.  The tibia was keel punched.  The patella was then cut from a 22 to a 12 mm cut and then a 32 trial patella was placed.  With trial components in position, the patient had  full extension, full flexion with no liftoff and excellent patellar tracking using no thumbs technique.  Trial components were removed.  Thorough irrigation performed with 3 L of irrigating solution, followed by topical TXA for 3 minutes, as well as  IrriSept solution.  The IrriSept was used at all times during the case after the incision, after the arthrotomy and also at all times.  The components were then tapped into position.  Bone quality was excellent.  Arthritis was severe in the medial  compartment, as well as patellofemoral compartment.  A large loose body was removed from the posterolateral aspect of the knee.  Following placement of the prosthesis, the tourniquet was released.  Bleeding points encountered controlled using  electrocautery.  Thorough irrigation again performed and then the arthrotomy was closed over a bolster using #1 Vicryl suture, followed by interrupted inverted 0 Vicryl suture, 2-0 Vicryl suture and a 3-0 Monocryl.  Steri-Strips, Aquacel dressing and a  large Ace wrap applied,  along with a knee immobilizer.  A solution of Marcaine, morphine, clonidine injected into the knee for postop pain relief.  The patient tolerated the procedure well without immediate complications.  She was transferred to the  recovery room in stable condition.  Luke's assistance was required at all times for the case for retraction,  opening and closing.  His assistance was a medical necessity.  VN/NUANCE  D:01/02/2019 T:01/02/2019 JOB:[id number]

## 2019-01-02 NOTE — Transfer of Care (Signed)
Immediate Anesthesia Transfer of Care Note  Patient: Katherine Lewis  Procedure(s) Performed: RIGHT TOTAL KNEE ARTHROPLASTY (Right Knee)  Patient Location: PACU  Anesthesia Type:MAC combined with regional for post-op pain  Level of Consciousness: awake, alert  and oriented  Airway & Oxygen Therapy: Patient Spontanous Breathing and Patient connected to face mask oxygen  Post-op Assessment: Report given to RN and Post -op Vital signs reviewed and stable  Post vital signs: Reviewed and stable  Last Vitals:  Vitals Value Taken Time  BP 110/69 01/02/19 1014  Temp    Pulse 77 01/02/19 1015  Resp 23 01/02/19 1015  SpO2 98 % 01/02/19 1015  Vitals shown include unvalidated device data.  Last Pain:  Vitals:   01/02/19 0618  TempSrc: Oral  PainSc:       Patients Stated Pain Goal: 5 (02/63/78 5885)  Complications: No apparent anesthesia complications

## 2019-01-02 NOTE — Anesthesia Procedure Notes (Signed)
Anesthesia Regional Block: Adductor canal block   Pre-Anesthetic Checklist: ,, timeout performed, Correct Patient, Correct Site, Correct Laterality, Correct Procedure, Correct Position, site marked, Risks and benefits discussed,  Surgical consent,  Pre-op evaluation,  At surgeon's request and post-op pain management  Laterality: Right  Prep: chloraprep       Needles:  Injection technique: Single-shot  Needle Type: Echogenic Stimulator Needle     Needle Length: 9cm  Needle Gauge: 21     Additional Needles:   Procedures:, nerve stimulator,,,,,,,   Nerve Stimulator or Paresthesia:  Response: 0.4 mA,   Additional Responses:   Narrative:  Start time: 01/02/2019 7:02 AM End time: 01/02/2019 7:12 AM Injection made incrementally with aspirations every 5 mL.  Performed by: Personally  Anesthesiologist: Lillia Abed, MD  Additional Notes: Monitors applied. Patient sedated. Sterile prep and drape,hand hygiene and sterile gloves were used. Relevant anatomy identified.Needle position confirmed.Local anesthetic injected incrementally after negative aspiration. Local anesthetic spread visualized around nerve(s). Vascular puncture avoided. No complications. Image printed for medical record.The patient tolerated the procedure well.

## 2019-01-02 NOTE — Anesthesia Procedure Notes (Signed)
Procedure Name: MAC Date/Time: 01/02/2019 7:44 AM Performed by: Imagene Riches, CRNA Pre-anesthesia Checklist: Patient identified, Emergency Drugs available, Suction available, Patient being monitored and Timeout performed Patient Re-evaluated:Patient Re-evaluated prior to induction Oxygen Delivery Method: Simple face mask

## 2019-01-02 NOTE — Progress Notes (Signed)
Pt stable Pain ok Df foot ok Plan pt and cpm with possible dc tomorrow afternoon

## 2019-01-02 NOTE — Evaluation (Signed)
Physical Therapy Evaluation Patient Details Name: Katherine Lewis MRN: [id number] DOB: April 08, 1970 Today's Date: 01/02/2019   History of Present Illness  Patient is a 48 y/o female s/p R TKA.  Clinical Impression  Patient presents with decreased mobility due to R LE weakness and decreased AROM.  She will benefit from skilled PT in the acute setting prior to d/c home.  Follow up as per MD.     Follow Up Recommendations Follow surgeon's recommendation for DC plan and follow-up therapies    Equipment Recommendations  Rolling walker with 5" wheels;3in1 (PT)    Recommendations for Other Services       Precautions / Restrictions Precautions Precautions: Fall;Knee Restrictions Other Position/Activity Restrictions: WBAT      Mobility  Bed Mobility Overal bed mobility: Needs Assistance Bed Mobility: Supine to Sit     Supine to sit: Supervision;HOB elevated        Transfers Overall transfer level: Needs assistance Equipment used: Rolling walker (2 wheeled) Transfers: Sit to/from Stand Sit to Stand: Min guard         General transfer comment: assist for safety  Ambulation/Gait Ambulation/Gait assistance: Min assist Gait Distance (Feet): 3 Feet Assistive device: Rolling walker (2 wheeled) Gait Pattern/deviations: Step-to pattern;Antalgic;Decreased step length - left;Decreased stance time - right     General Gait Details: assist due to R knee buckling; steps to chair with RW  Stairs            Wheelchair Mobility    Modified Rankin (Stroke Patients Only)       Balance                                             Pertinent Vitals/Pain Pain Assessment: Faces Faces Pain Scale: Hurts little more Pain Location: R knee with mobility Pain Descriptors / Indicators: Grimacing;Guarding Pain Intervention(s): Monitored during session;Repositioned;Ice applied    Home Living Family/patient expects to be discharged to:: Private residence Living  Arrangements: Spouse/significant other;Other relatives Available Help at Discharge: Family Type of Home: House Home Access: Stairs to enter Entrance Stairs-Rails: None Entrance Stairs-Number of Steps: 2 Home Layout: Two level;Able to live on main level with bedroom/bathroom Home Equipment: None      Prior Function Level of Independence: Independent               Hand Dominance        Extremity/Trunk Assessment   Upper Extremity Assessment Upper Extremity Assessment: Overall WFL for tasks assessed    Lower Extremity Assessment Lower Extremity Assessment: RLE deficits/detail RLE Deficits / Details: AAROM knee flexion about 40, able to lift with quad lag       Communication   Communication: No difficulties  Cognition Arousal/Alertness: Awake/alert Behavior During Therapy: WFL for tasks assessed/performed Overall Cognitive Status: Within Functional Limits for tasks assessed                                        General Comments      Exercises Total Joint Exercises Ankle Circles/Pumps: AROM;Both;10 reps;Seated Quad Sets: AROM;5 reps;Both;Seated Heel Slides: AAROM;5 reps;Seated;Right Hip ABduction/ADduction: AAROM;5 reps;Seated;Right   Assessment/Plan    PT Assessment Patient needs continued PT services  PT Problem List Decreased mobility;Decreased knowledge of use of DME;Pain;Decreased safety awareness;Decreased activity tolerance  PT Treatment Interventions DME instruction;Stair training;Therapeutic activities;Balance training;Gait training;Functional mobility training;Therapeutic exercise;Patient/family education    PT Goals (Current goals can be found in the Care Plan section)  Acute Rehab PT Goals Patient Stated Goal: to go home PT Goal Formulation: With patient Time For Goal Achievement: 01/09/19 Potential to Achieve Goals: Good    Frequency 7X/week   Barriers to discharge        Co-evaluation                AM-PAC PT "6 Clicks" Mobility  Outcome Measure Help needed turning from your back to your side while in a flat bed without using bedrails?: None Help needed moving from lying on your back to sitting on the side of a flat bed without using bedrails?: None Help needed moving to and from a bed to a chair (including a wheelchair)?: A Little Help needed standing up from a chair using your arms (e.g., wheelchair or bedside chair)?: None Help needed to walk in hospital room?: A Little Help needed climbing 3-5 steps with a railing? : A Little 6 Click Score: 21    End of Session   Activity Tolerance: Patient tolerated treatment well Patient left: in chair;with call bell/phone within reach Nurse Communication: Mobility status PT Visit Diagnosis: Difficulty in walking, not elsewhere classified (R26.2);Pain Pain - Right/Left: Right Pain - part of body: Knee    Time: 5638-7564 PT Time Calculation (min) (ACUTE ONLY): 23 min   Charges:   PT Evaluation $PT Eval Low Complexity: 1 Low PT Treatments $Therapeutic Activity: 8-22 mins        Magda Kiel, [place] Acute Rehabilitation Services [phone] 01/02/2019   Reginia Naas 01/02/2019, 6:10 PM

## 2019-01-02 NOTE — H&P (Signed)
TOTAL KNEE ADMISSION H&P  Patient is being admitted for right total knee arthroplasty.  Subjective:  Chief Complaint:right knee pain.  HPI: Katherine Lewis, 48 y.o. female, has a history of pain and functional disability in the right knee due to arthritis and has failed non-surgical conservative treatments for greater than 12 weeks to includeNSAID's and/or analgesics, corticosteriod injections, viscosupplementation injections, flexibility and strengthening excercises and activity modification.  Onset of symptoms was gradual, starting 4 years ago with rapidlly worsening course since that time. The patient noted no past surgery on the right knee(s).  Patient currently rates pain in the right knee(s) at 8 out of 10 with activity. Patient has night pain, worsening of pain with activity and weight bearing, pain that interferes with activities of daily living, pain with passive range of motion, crepitus and joint swelling.  Patient has evidence of subchondral sclerosis and joint space narrowing by imaging studies. This patient has had Failure of all nonoperative treatment options and has pain with activities of daily living and pain which prevents her from walking comfortably.. There is no active infection.  Patient Active Problem List   Diagnosis Date Noted  . Essential hypertension 11/21/2018  . Obesity (BMI 35.0-39.9 without comorbidity) 11/21/2018  . Dysthymia 11/21/2018  . IUD (intrauterine device) in place 12/19/2017  . Fatigue 12/19/2017  . Mild sleep apnea 12/19/2017  . Acne 12/19/2017  . Dysuria 12/19/2017  . 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 Chronic cough 04/03/2012   Past Medical History:  Diagnosis Date  . Anxiety   . Depression   . GERD (gastroesophageal reflux disease)   . Hypertension   . Hypothyroidism   . Insomnia   . Plantar fasciitis   . Sleep apnea    does not wear Cpap  . Vitamin D deficiency     Past Surgical History:  Procedure Laterality  Date  . LAPAROSCOPIC GASTRIC SLEEVE RESECTION N/A 04/23/2014   Procedure: LAPAROSCOPIC GASTRIC SLEEVE RESECTION WITH UPPER ENDOSCOPY;  Surgeon: Johnathan Hausen, MD;  Location: [facility];  Service: General;  Laterality: N/A;  . MYRINGOTOMY     bilateral     Current Facility-Administered Medications  Medication Dose Route Frequency Provider Last Rate Last Admin  . bupivacaine liposome (EXPAREL) 1.3 % injection 266 mg  20 mL Infiltration To OR Marlou Sa, Tonna Corner, MD      . ceFAZolin (ANCEF) IVPB 2g/100 mL premix  2 g Intravenous On Call to OR Magnant, Charles L, PA-C      . chlorhexidine (HIBICLENS) 4 % liquid 4 application  60 mL Topical Once Magnant, Charles L, PA-C      . chlorhexidine (HIBICLENS) 4 % liquid 4 application  60 mL Topical Once Magnant, Charles L, PA-C      . povidone-iodine 10 % swab 2 application  2 application Topical Once Magnant, Charles L, PA-C      . tranexamic acid (CYKLOKAPRON) 2,000 mg in sodium chloride 0.9 % 50 mL Topical Application  3,664 mg Topical To OR Meredith Pel, MD      . tranexamic acid (CYKLOKAPRON) IVPB 1,000 mg  1,000 mg Intravenous To OR Marlou Sa, Tonna Corner, MD       Allergies  Allergen Reactions  . Codeine Nausea Only    Social History   Tobacco Use  . Smoking status: Former Smoker    Packs/day: 0.50    Years: 6.00    Pack years: 3.00    Types: Cigarettes    Quit date: 01/19/1992  Years since quitting: 26.9  . Smokeless tobacco: Never Used  . Tobacco comment: social   Substance Use Topics  . Alcohol use: Yes    Comment: social     Family History  Problem Relation Age of Onset  . Diabetes type II Mother   . Diabetes Mother   . Lung cancer Paternal Grandmother   . Lung cancer Paternal Grandfather      Review of Systems  Musculoskeletal: Positive for arthralgias.  All other systems reviewed and are negative.   Objective:  Physical Exam  Constitutional: She appears well-developed.  HENT:  Head: Normocephalic.  Eyes: Pupils  are equal, round, and reactive to light.  Cardiovascular: Normal rate.  Respiratory: Effort normal.  Musculoskeletal:     Cervical back: Normal range of motion.  Neurological: She is alert.  Skin: Skin is warm.  Psychiatric: She has a normal mood and affect.  Examination of the left knee demonstrates about a 5 degree flexion contracture with flexion past 90.  Patient has both medial and lateral joint line tenderness with intact extensor mechanism.  Mild effusion is present.  No groin pain with internal X rotation of the leg.  Pedal pulses palpable.  Ankle dorsiflexion intact.  Vital signs in last 24 hours: Temp:  [98.4 F (36.9 C)] 98.4 F (36.9 C) (12/15 0618) Pulse Rate:  [93] 93 (12/15 0618) Resp:  [18] 18 (12/15 0618) BP: (143)/(86) 143/86 (12/15 0618) SpO2:  [93 %] 93 % (12/15 0618) Weight:  [108.9 kg-111.4 kg] 108.9 kg (12/15 0618)  Labs:   Estimated body mass index is 39.94 kg/m as calculated from the following:   Height as of this encounter: 5' 5"  (1.651 m).   Weight as of this encounter: 108.9 kg.   Imaging Review Plain radiographs demonstrate moderate degenerative joint disease of the right knee(s). The overall alignment isneutral. The bone quality appears to be good for age and reported activity level.      Assessment/Plan:  End stage arthritis, right knee   The patient history, physical examination, clinical judgment of the provider and imaging studies are consistent with end stage degenerative joint disease of the right knee(s) and total knee arthroplasty is deemed medically necessary. The treatment options including medical management, injection therapy arthroscopy and arthroplasty were discussed at length. The risks and benefits of total knee arthroplasty were presented and reviewed. The risks due to aseptic loosening, infection, stiffness, patella tracking problems, thromboembolic complications and other imponderables were discussed. The patient acknowledged  the explanation, agreed to proceed with the plan and consent was signed. Patient is being admitted for inpatient treatment for surgery, pain control, PT, OT, prophylactic antibiotics, VTE prophylaxis, progressive ambulation and ADL's and discharge planning. The patient is planning to be discharged home with home health services     Patient's anticipated LOS is less than 2 midnights, meeting these requirements: - Younger than 24 - Lives within 1 hour of care - Has a competent adult at home to recover with post-op recover - NO history of  - Chronic pain requiring opiods  - Diabetes  - Coronary Artery Disease  - Heart failure  - Heart attack  - Stroke  - DVT/VTE  - Cardiac arrhythmia  - Respiratory Failure/COPD  - Renal failure  - Anemia  - Advanced Liver disease

## 2019-01-02 NOTE — Anesthesia Postprocedure Evaluation (Signed)
Anesthesia Post Note  Patient: Katherine Lewis  Procedure(s) Performed: RIGHT TOTAL KNEE ARTHROPLASTY (Right Knee)     Patient location during evaluation: PACU Anesthesia Type: MAC Level of consciousness: oriented and awake and alert Pain management: pain level controlled Vital Signs Assessment: post-procedure vital signs reviewed and stable Respiratory status: spontaneous breathing, respiratory function stable and patient connected to nasal cannula oxygen Cardiovascular status: blood pressure returned to baseline and stable Postop Assessment: no headache, no backache and no apparent nausea or vomiting Anesthetic complications: no    Last Vitals:  Vitals:   01/02/19 1058 01/02/19 1113  BP: 119/70 120/77  Pulse: (!) 57 (!) 59  Resp: 18 18  Temp:    SpO2: 100% 99%    Last Pain:  Vitals:   01/02/19 1113  TempSrc:   PainSc: 1                  [name] DAVID

## 2019-01-02 NOTE — Progress Notes (Signed)
Orthopedic Tech Progress Note Patient Details:  Katherine Lewis 10-22-1970 [id number] Applied CPM on patient in PACU.Marland Kitchen orders wanted me to apply CPM at 10-40 degrees patient asked if I could adjust it to a bigger number. So I increased it by 5s and eventually we stopped at 10-55. I told patient I did not want to over work her just yet, while PACU RN was at bedside. Patient is wonderful.  CPM Right Knee CPM Right Knee: On Right Knee Flexion (Degrees): 10 Right Knee Extension (Degrees): 55 Additional Comments: i added ice  Post Interventions Patient Tolerated: Well Instructions Provided: Care of device, Adjustment of device Ortho Devices Type of Ortho Device: Bone foam zero knee   Post Interventions Patient Tolerated: Well Instructions Provided: Care of device, Adjustment of device   Janit Pagan 01/02/2019, 11:01 AM

## 2019-01-02 NOTE — Anesthesia Preprocedure Evaluation (Signed)
Anesthesia Evaluation  Patient identified by MRN, date of birth, ID band Patient awake    Reviewed: Allergy & Precautions, NPO status , Patient's Chart, lab work & pertinent test results  Airway Mallampati: I  TM Distance: >3 FB Neck ROM: Full    Dental   Pulmonary sleep apnea , former smoker,    Pulmonary exam normal        Cardiovascular hypertension, Pt. on medications Normal cardiovascular exam     Neuro/Psych Anxiety Depression    GI/Hepatic GERD  Medicated and Controlled,  Endo/Other    Renal/GU      Musculoskeletal   Abdominal   Peds  Hematology   Anesthesia Other Findings   Reproductive/Obstetrics                             Anesthesia Physical Anesthesia Plan  ASA: III  Anesthesia Plan: Spinal and MAC   Post-op Pain Management:  Regional for Post-op pain   Induction: Intravenous  PONV Risk Score and Plan: 2 and Ondansetron and Treatment may vary due to age or medical condition  Airway Management Planned: Nasal Cannula  Additional Equipment:   Intra-op Plan:   Post-operative Plan:   Informed Consent: I have reviewed the patients History and Physical, chart, labs and discussed the procedure including the risks, benefits and alternatives for the proposed anesthesia with the patient or authorized representative who has indicated his/her understanding and acceptance.       Plan Discussed with: CRNA and Surgeon  Anesthesia Plan Comments:         Anesthesia Quick Evaluation

## 2019-01-02 NOTE — Anesthesia Procedure Notes (Signed)
Spinal  Patient location during procedure: OR Start time: 01/02/2019 7:25 AM End time: 01/02/2019 7:29 AM Staffing Performed: anesthesiologist  Anesthesiologist: Lillia Abed, MD Preanesthetic Checklist Completed: patient identified, IV checked, risks and benefits discussed, surgical consent, monitors and equipment checked, pre-op evaluation and timeout performed Spinal Block Patient position: sitting Prep: DuraPrep Patient monitoring: blood pressure, continuous pulse ox, cardiac monitor and heart rate Approach: left paramedian Location: L3-4 Injection technique: single-shot Needle Needle type: Pencan  Needle length: 9 cm Needle insertion depth: 7 cm

## 2019-01-03 MED ORDER — MINOCYCLINE HCL 100 MG PO CAPS
100.0000 mg | ORAL_CAPSULE | Freq: Two times a day (BID) | ORAL | Status: DC
  Administered 2019-01-03 (×2): 100 mg via ORAL
  Filled 2019-01-03 (×3): qty 1

## 2019-01-03 NOTE — Progress Notes (Signed)
PT Progress Note for Charges    01/03/19 1354  PT Visit Information  Last PT Received On 01/03/19  PT General Charges  $$ ACUTE PT VISIT 1 Visit  PT Treatments  $Gait Training 8-22 mins  $Therapeutic Exercise 8-22 mins  Anastasio Champion, DPT  Acute Rehabilitation Services Pager [phone] Office [phone]

## 2019-01-03 NOTE — Progress Notes (Signed)
Patient's IV removed d/t increased resistance when flushed and pain, no infiltration and no redness or edema to site.  Patient requested to not have another IV started at this time.  MD made aware.

## 2019-01-03 NOTE — Progress Notes (Signed)
PT Progress Note for Charges    01/03/19 1000  PT Visit Information  Last PT Received On 01/03/19  PT General Charges  $$ ACUTE PT VISIT 1 Visit  PT Treatments  $Therapeutic Exercise 8-22 mins  Anastasio Champion, DPT  Acute Rehabilitation Services Pager [phone] Office [phone]

## 2019-01-03 NOTE — Progress Notes (Signed)
  Subjective: Katherine Lewis is a 48 y.o. female s/p right TKA.  They are POD1.  Pt's pain is severe.  Pt has ambulated with great difficulty to the bathroom multiple times.   Denies fever or chills.    Objective: Vital signs in last 24 hours: Temp:  [97 F (36.1 C)-98.9 F (37.2 C)] 98.2 F (36.8 C) (12/16 0752) Pulse Rate:  [57-85] 85 (12/16 0752) Resp:  [16-21] 16 (12/16 0752) BP: (110-135)/(63-97) 135/69 (12/16 0752) SpO2:  [92 %-100 %] 92 % (12/16 0752)  Intake/Output from previous day: 12/15 0701 - 12/16 0700 In: 1909.1 [I.V.:1909.1] Out: 250 [Urine:200; Blood:50] Intake/Output this shift: No intake/output data recorded.  Exam:  No gross blood or drainage overlying the dressing 2+ DP pulse Sensation intact distally in the right foot.  Reduced sensation medial to the incision around the distal thigh and proximal calf Able to dorsiflex and plantarflex the right foot   Labs: No results for input(s): HGB in the last 72 hours. No results for input(s): WBC, RBC, HCT, PLT in the last 72 hours. No results for input(s): NA, K, CL, CO2, BUN, CREATININE, GLUCOSE, CALCIUM in the last 72 hours. No results for input(s): LABPT, INR in the last 72 hours.  Assessment/Plan: Pt is POD1 s/p right TKA.    -Plan to discharge to home in coming days pending patient's pain and PT eval  -Pain is not well-controlled enough for discharge at this time  -WBAT with a walker    Katherine Lewis Katherine Lewis 01/03/2019, 8:45 AM

## 2019-01-03 NOTE — Progress Notes (Signed)
Pt called out in tears d/t to pain. Pt states pain has not been at a tolerable level the entire night. May ease up for 30 minutes to an hour but increases shortly after. Dr. Marlou Sa notified through secure chat. Will continue to monitor pt until advised.

## 2019-01-03 NOTE — Plan of Care (Signed)

## 2019-01-03 NOTE — Progress Notes (Signed)
Patient doing reasonably well but having difficulty mobilizing and with pain control  Has not really been able to fully participate in physical therapy this morning.  Plan on keeping her tonight and then allowing 1-2 more sessions of physical therapy tomorrow so she can manage stairs.  Anticipate discharge tomorrow afternoon.

## 2019-01-03 NOTE — Progress Notes (Signed)
Physical Therapy Treatment Patient Details Name: Katherine Lewis MRN: [id number] DOB: 1970/02/17 Today's Date: 01/03/2019    History of Present Illness Patient is a 48 y/o female s/p R TKA.    PT Comments    Pt admitted for above. Pt laying on side upon arrival and stated that she is in unbearable pain but is agreeable to bed-level exercises this morning. Pt able to complete therapeutic exercise as described below and noted pain with all exercises except glute sets and ankle pumps. She stated that she is concerned about her pain and that she feels constant stinging. RN notified for pt request for pain meds. Will return later today to progress mobility. Pt would benefit from continued skilled PT intervention in order to address impairments.   Follow Up Recommendations  Follow surgeon's recommendation for DC plan and follow-up therapies     Equipment Recommendations  Rolling walker with 5" wheels;3in1 (PT)    Recommendations for Other Services       Precautions / Restrictions Precautions Precautions: Fall;Knee Restrictions Weight Bearing Restrictions: Yes RLE Weight Bearing: Weight bearing as tolerated    Mobility  Bed Mobility Overal bed mobility: Needs Assistance Bed Mobility: Rolling Rolling: Supervision         General bed mobility comments: pt on side upon arrival and rolled to supine with supervision and increased time  Transfers                    Ambulation/Gait                 Stairs             Wheelchair Mobility    Modified Rankin (Stroke Patients Only)       Balance                                            Cognition Arousal/Alertness: Lethargic Behavior During Therapy: WFL for tasks assessed/performed Overall Cognitive Status: Within Functional Limits for tasks assessed                                        Exercises Total Joint Exercises Ankle Circles/Pumps: AROM;Both;15  reps;Supine Quad Sets: AROM;Right;10 reps;Supine Gluteal Sets: Strengthening;Both;10 reps;Supine Heel Slides: AAROM;5 reps;Right;Supine Hip ABduction/ADduction: AROM;Right;10 reps;Supine Goniometric ROM: NT    General Comments        Pertinent Vitals/Pain Pain Assessment: Faces Faces Pain Scale: Hurts worst Pain Location: R knee with mobility Pain Descriptors / Indicators: Grimacing;Guarding;Crying Pain Intervention(s): Limited activity within patient's tolerance;Monitored during session;Patient requesting pain meds-RN notified    Home Living                      Prior Function            PT Goals (current goals can now be found in the care plan section) Acute Rehab PT Goals Patient Stated Goal: to go home PT Goal Formulation: With patient Time For Goal Achievement: 01/09/19 Potential to Achieve Goals: Good Progress towards PT goals: Progressing toward goals    Frequency    7X/week      PT Plan Current plan remains appropriate    Co-evaluation              AM-PAC PT "6 Clicks" Mobility  Outcome Measure  Help needed turning from your back to your side while in a flat bed without using bedrails?: None Help needed moving from lying on your back to sitting on the side of a flat bed without using bedrails?: None Help needed moving to and from a bed to a chair (including a wheelchair)?: A Little Help needed standing up from a chair using your arms (e.g., wheelchair or bedside chair)?: None Help needed to walk in hospital room?: A Little Help needed climbing 3-5 steps with a railing? : A Little 6 Click Score: 21    End of Session   Activity Tolerance: Patient limited by pain Patient left: in bed;with call bell/phone within reach Nurse Communication: Mobility status;Patient requests pain meds PT Visit Diagnosis: Difficulty in walking, not elsewhere classified (R26.2);Pain Pain - Right/Left: Right Pain - part of body: Knee     Time:  0813-0827 PT Time Calculation (min) (ACUTE ONLY): 14 min  Charges:  $Therapeutic Exercise: 8-22 mins                     Christel Mormon, SPT   Katherine Lewis 01/03/2019, 10:16 AM

## 2019-01-03 NOTE — Progress Notes (Signed)
PT Progress Note  Clinical Impression: Pt in bed upon arrival, stating that her pain has decreased since this morning. She was able to perform bed mobility with supervision, and transfers with min guard for safety. She demonstrated improvement in functional mobility by increasing her gait distance to 50 ft with min guard for safety. She required cueing for hand placement and sequencing throughout session but had no LOB or unsteadiness with gait. Current goniometric AROM is 5-60 degrees. Passive knee flexion is 65 degrees. Pt would benefit from continued skilled acute PT in order to address deficits.     01/03/19 1300  PT Visit Information  Last PT Received On 01/03/19  Assistance Needed +1  History of Present Illness Patient is a 48 y/o female s/p R TKA.  Subjective Data  Subjective Pt states that her pain is better than this morning and is agreeable to PT intervention  Patient Stated Goal to go home  Precautions  Precautions Fall;Knee  Restrictions  Weight Bearing Restrictions Yes  RLE Weight Bearing WBAT  Pain Assessment  Pain Assessment Faces  Faces Pain Scale 6  Pain Location R knee with mobility  Pain Descriptors / Indicators Grimacing;Guarding;Crying  Pain Intervention(s) Limited activity within patient's tolerance;Monitored during session;Repositioned  Cognition  Arousal/Alertness Awake/alert  Behavior During Therapy WFL for tasks assessed/performed  Overall Cognitive Status Within Functional Limits for tasks assessed  Bed Mobility  Overal bed mobility Needs Assistance  Bed Mobility Supine to Sit  Supine to sit Supervision;HOB elevated  General bed mobility comments pt able to sit EOB without physical assistance but with increased time and effort  Transfers  Overall transfer level Needs assistance  Equipment used Rolling walker (2 wheeled)  Transfers Sit to/from Stand  Sit to Stand Min guard  General transfer comment min guard for safety; pt able to power up on own   Ambulation/Gait  Ambulation/Gait assistance Min guard  Gait Distance (Feet) 50 Feet  Assistive device Rolling walker (2 wheeled)  Gait Pattern/deviations Step-to pattern;Antalgic;Decreased step length - left;Decreased stance time - right  General Gait Details min guard for safety; pt required cueing for walker sequencing and to roll it rather than picking it up; no LOB or unsteadiness but gait pattern became progressively more antalgic as ambulation progressed  Gait velocity decreased  Balance  Overall balance assessment Needs assistance  Sitting-balance support No upper extremity supported;Feet unsupported  Sitting balance-Leahy Scale Good  Sitting balance - Comments pt able to maintain EOB balance for donning/doffing of gait belt  Standing balance support No upper extremity supported;During functional activity;Bilateral upper extremity supported  Standing balance-Leahy Scale Fair  Standing balance comment pt able to maintain static standing without UE support on walker for hand washing and donning her mask; likely would not tolerate challenge  Total Joint Exercises  Ankle Circles/Pumps AROM;Both;10 reps;Seated  Quad Sets AROM;Right;10 reps;Seated  Long Arc Quad AROM;Right;10 reps;Seated  Goniometric ROM 5-60 (AROM); 5-65 (AAROM)  PT - End of Session  Equipment Utilized During Treatment Gait belt  Activity Tolerance Patient limited by pain;Patient tolerated treatment well  Patient left in chair;with call bell/phone within reach  Nurse Communication Mobility status  CPM Right Knee  CPM Right Knee Off   PT - Assessment/Plan  PT Plan Current plan remains appropriate  PT Visit Diagnosis Difficulty in walking, not elsewhere classified (R26.2);Pain  Pain - Right/Left Right  Pain - part of body Knee  PT Frequency (ACUTE ONLY) 7X/week  Follow Up Recommendations Follow surgeon's recommendation for DC plan and follow-up therapies  PT equipment Rolling walker with 5" wheels;3in1 (PT)   AM-PAC PT "6 Clicks" Mobility Outcome Measure (Version 2)  Help needed turning from your back to your side while in a flat bed without using bedrails? 3  Help needed moving from lying on your back to sitting on the side of a flat bed without using bedrails? 3  Help needed moving to and from a bed to a chair (including a wheelchair)? 4  Help needed standing up from a chair using your arms (e.g., wheelchair or bedside chair)? 4  Help needed to walk in hospital room? 4  Help needed climbing 3-5 steps with a railing?  2  6 Click Score 20  Consider Recommendation of Discharge To: Home with no services  PT Goal Progression  Progress towards PT goals Progressing toward goals  Acute Rehab PT Goals  PT Goal Formulation With patient  Time For Goal Achievement 01/09/19  Potential to Achieve Goals Good  PT Time Calculation  PT Start Time (ACUTE ONLY) 1317  PT Stop Time (ACUTE ONLY) 1349  PT Time Calculation (min) (ACUTE ONLY) 32 min   PT Charges: $Gait Training 8-22 min  $Therapeutic Exercise 8-22 min  Christel Mormon, SPT

## 2019-01-04 ENCOUNTER — Encounter: Payer: Self-pay | Admitting: *Deleted

## 2019-01-04 ENCOUNTER — Encounter: Payer: Self-pay | Admitting: Orthopedic Surgery

## 2019-01-04 MED ORDER — OXYCODONE HCL 5 MG PO TABS: 5.0000 mg | ORAL_TABLET | ORAL | 0 refills | Status: DC | PRN

## 2019-01-04 MED ORDER — METHOCARBAMOL 500 MG PO TABS: 500.0000 mg | ORAL_TABLET | Freq: Three times a day (TID) | ORAL | 0 refills | Status: DC | PRN

## 2019-01-04 MED ORDER — DOCUSATE SODIUM 100 MG PO CAPS: 100.0000 mg | ORAL_CAPSULE | Freq: Two times a day (BID) | ORAL | 0 refills | Status: DC

## 2019-01-04 MED ORDER — ASPIRIN 81 MG PO CHEW: 81.0000 mg | CHEWABLE_TABLET | Freq: Two times a day (BID) | ORAL | 0 refills | Status: DC

## 2019-01-04 NOTE — TOC Transition Note (Signed)
Transition of Care [facility]) - CM/SW Discharge Note   Patient Details  Name: Katherine Lewis MRN: [id number] Date of Birth: September 19, 1970  Transition of Care [facility]) CM/SW Contact:  Midge Minium MSN, RN, NCM-BC, ACM-RN [phone] Phone Number: 01/04/2019, 8:49 AM   Clinical Narrative:    CM following for transitional needs. CM spoke to the patient to discuss the POC. The patient lived at home with her family and was independent PTA. PCP/demo verified. Patient is a 48 y/o female s/p R TKA. PT eval completed with a RW/BSC recommended. [facility] was arranged by the Ortho MD PTA. CM discussed [place] arrangement with the patient agreeable to the [place] prearranged by her Ortho office. DME preference provided with the patient agreeable to [facility]. DME referral given to Zack ([facility] liaison); AVS updated. Patients family will provide transportation home. No further needs from CM.    Final next level of care: [place] Barriers to Discharge: No Barriers Identified   Patient Goals and CMS Choice Patient states their goals for this hospitalization and ongoing recovery are:: "to get home" CMS Medicare.gov Compare Post Acute Care list provided to:: Patient Choice offered to / list presented to : Patient   Discharge Plan and Services                DME Arranged: Bedside commode, Walker rolling DME Agency: [facility] Date DME Agency Contacted: 01/04/19 Time DME Agency Contacted: [phone] Representative spoke with at DME Agency: Christopher (liaison) [place]: PT [place]: [facility] [facility] [employer] Date [place]: 01/04/19 Time [place]: [phone] Representative spoke with at [place]: [place] (liaison)  Social Determinants of Health ([place]) Interventions     Readmission Risk Interventions No flowsheet data found.

## 2019-01-04 NOTE — Progress Notes (Signed)
Discharge papers given to patient, patient was asked to wait for CPM guy to arrive but patient's ride was here and could not wait.  Patient stated she has his number and will contact him directly.  All personal belongings were gathered and RW and [facility] taken with patient for discharge.  Patient acknowledged prescription and MD discharge instructions, denied further questions.

## 2019-01-04 NOTE — Progress Notes (Signed)
Physical Therapy Progress Note  Clinical Impression: Pt making steady progress with functional mobility. She tolerated stair training well with demonstration and instruction. Pt's friend present throughout and expressed that she would be able to appropriately assist pt upon d/c home. Plan is to d/c home today. Pt is ready for d/c from PT perspective.     01/04/19 1200  PT Visit Information  Last PT Received On 01/04/19  Assistance Needed +1  History of Present Illness Patient is a 48 y/o female s/p R TKA.  Precautions  Precautions Fall;Knee  Restrictions  Weight Bearing Restrictions Yes  RLE Weight Bearing WBAT  Pain Assessment  Pain Assessment Faces  Faces Pain Scale 6  Pain Location R knee with mobility  Pain Descriptors / Indicators Sore;Guarding  Pain Intervention(s) Monitored during session;Premedicated before session;Repositioned  Cognition  Arousal/Alertness Awake/alert  Behavior During Therapy WFL for tasks assessed/performed  Overall Cognitive Status Within Functional Limits for tasks assessed  Bed Mobility  General bed mobility comments pt seated EOB upon arrival  Transfers  Overall transfer level Needs assistance  Equipment used None;Rolling walker (2 wheeled)  Transfers Sit to/from Stand  Sit to Stand Supervision  General transfer comment for safety  Ambulation/Gait  Ambulation/Gait assistance Supervision  Gait Distance (Feet) 50 Feet  Assistive device Rolling walker (2 wheeled)  Gait Pattern/deviations Step-to pattern;Antalgic;Decreased step length - left;Decreased stance time - right  General Gait Details supervision for safety, no instability or LOB  Gait velocity decreased  Stairs Yes  Stairs assistance Min assist  Stair Management No rails;Step to pattern;Backwards;With walker  Number of Stairs 2  General stair comments PT demonstrated and instructed on ascending/descending stairs with use of RW backwards as pt does not have railing to use at home. Pt's  friend was present throughout and expressed understanding of assistance needed  Balance  Overall balance assessment Needs assistance  Sitting-balance support No upper extremity supported;Feet unsupported  Sitting balance-Leahy Scale Good  Standing balance support No upper extremity supported;During functional activity;Bilateral upper extremity supported  Standing balance-Leahy Scale Fair  PT - End of Session  Activity Tolerance Patient limited by pain  Patient left in chair;with call bell/phone within reach;with family/visitor present  Nurse Communication Mobility status  CPM Right Knee  CPM Right Knee Off   PT - Assessment/Plan  PT Plan Current plan remains appropriate  PT Visit Diagnosis Difficulty in walking, not elsewhere classified (R26.2);Pain  Pain - Right/Left Right  Pain - part of body Knee  PT Frequency (ACUTE ONLY) 7X/week  Follow Up Recommendations Home health PT  PT equipment Rolling walker with 5" wheels;3in1 (PT)  AM-PAC PT "6 Clicks" Mobility Outcome Measure (Version 2)  Help needed turning from your back to your side while in a flat bed without using bedrails? 4  Help needed moving from lying on your back to sitting on the side of a flat bed without using bedrails? 4  Help needed moving to and from a bed to a chair (including a wheelchair)? 4  Help needed standing up from a chair using your arms (e.g., wheelchair or bedside chair)? 4  Help needed to walk in hospital room? 3  Help needed climbing 3-5 steps with a railing?  3  6 Click Score 22  Consider Recommendation of Discharge To: Home with no services  PT Goal Progression  Progress towards PT goals Progressing toward goals  Acute Rehab PT Goals  PT Goal Formulation With patient  Time For Goal Achievement 01/09/19  Potential to Achieve Goals Good  PT Time Calculation  PT Start Time (ACUTE ONLY) 1000  PT Stop Time (ACUTE ONLY) 1016  PT Time Calculation (min) (ACUTE ONLY) 16 min  PT General Charges  $$ ACUTE  PT VISIT 1 Visit  PT Treatments  $Gait Training 8-22 mins  Anastasio Champion, DPT  Acute Rehabilitation Services Pager [phone] Office [phone]

## 2019-01-04 NOTE — Progress Notes (Signed)
  Subjective: Katherine Lewis is a 48 y.o. female s/p right TKA.  They are POD2.  Pt's pain is controlled and vastly improved from yesterday.  Pt denies numbness/tingling/weakness.  Pt has ambulated with some difficulty but states that she was walking well in PT yesterday throughout the halls and feels ready for discharge this AM.     Objective: Vital signs in last 24 hours: Temp:  [98.3 F (36.8 C)-98.6 F (37 C)] 98.6 F (37 C) (12/17 0312) Pulse Rate:  [78-85] 78 (12/17 0312) Resp:  [17] 17 (12/17 0312) BP: (110-141)/(61-71) 141/61 (12/17 0312) SpO2:  [91 %-97 %] 97 % (12/17 0312)  Intake/Output from previous day: 12/16 0701 - 12/17 0700 In: 360 [P.O.:360] Out: -  Intake/Output this shift: No intake/output data recorded.  Exam:  No gross blood or drainage overlying the dressing 2+ DP pulse Sensation intact distally in the right foot Able to dorsiflex and plantarflex the right foot   Labs: No results for input(s): HGB in the last 72 hours. No results for input(s): WBC, RBC, HCT, PLT in the last 72 hours. No results for input(s): NA, K, CL, CO2, BUN, CREATININE, GLUCOSE, CALCIUM in the last 72 hours. No results for input(s): LABPT, INR in the last 72 hours.  Assessment/Plan: Pt is POD2 s/p right TKA.    -Plan to discharge to home today  -WBAT with a walker     Arjen Deringer L Aracelys Glade 01/04/2019, 8:25 AM

## 2019-01-04 NOTE — Progress Notes (Signed)
Physical Therapy Treatment Patient Details Name: Katherine Lewis MRN: [id number] DOB: 11/10/1970 Today's Date: 01/04/2019    History of Present Illness Patient is a 48 y/o female s/p R TKA.    PT Comments    Pt limited this session secondary to pain. Pt reporting just being given pain meds prior to PT arrival. Pt agreeable to LE strengthening therex (see below). Pt will need to participate in stair training today prior to d/c home. Pt requesting therapist return at 10:00am as that is when she is scheduled to d/c.  Pt would continue to benefit from skilled physical therapy services at this time while admitted and after d/c to address the below listed limitations in order to improve overall safety and independence with functional mobility.    Follow Up Recommendations  Home health PT     Equipment Recommendations  Rolling walker with 5" wheels;3in1 (PT)    Recommendations for Other Services       Precautions / Restrictions Precautions Precautions: Fall;Knee Restrictions Weight Bearing Restrictions: Yes RLE Weight Bearing: Weight bearing as tolerated    Mobility  Bed Mobility               General bed mobility comments: pt only agreeable to bed level therex this session as she was just given pain meds prior to arrival  Transfers                    Ambulation/Gait                 Stairs             Wheelchair Mobility    Modified Rankin (Stroke Patients Only)       Balance                                            Cognition Arousal/Alertness: Awake/alert Behavior During Therapy: WFL for tasks assessed/performed Overall Cognitive Status: Within Functional Limits for tasks assessed                                        Exercises Total Joint Exercises Ankle Circles/Pumps: AROM;Right;15 reps;Supine Quad Sets: AROM;Strengthening;Right;10 reps;Supine Gluteal Sets: AROM;Strengthening;Both;10  reps;Supine Short Arc Quad: AAROM;Right;10 reps;Supine Heel Slides: AAROM;Right;10 reps;Supine Hip ABduction/ADduction: AROM;Right;10 reps;Supine Straight Leg Raises: AAROM;Right;10 reps;Supine    General Comments        Pertinent Vitals/Pain Pain Assessment: Faces Faces Pain Scale: Hurts even more Pain Location: R knee with mobility Pain Descriptors / Indicators: Sore;Guarding Pain Intervention(s): Monitored during session;Repositioned    Home Living                      Prior Function            PT Goals (current goals can now be found in the care plan section) Acute Rehab PT Goals PT Goal Formulation: With patient Time For Goal Achievement: 01/09/19 Potential to Achieve Goals: Good Progress towards PT goals: Progressing toward goals    Frequency    7X/week      PT Plan Current plan remains appropriate    Co-evaluation              AM-PAC PT "6 Clicks" Mobility   Outcome Measure  Help needed turning from your back  to your side while in a flat bed without using bedrails?: None Help needed moving from lying on your back to sitting on the side of a flat bed without using bedrails?: None Help needed moving to and from a bed to a chair (including a wheelchair)?: None Help needed standing up from a chair using your arms (e.g., wheelchair or bedside chair)?: None Help needed to walk in hospital room?: A Little Help needed climbing 3-5 steps with a railing? : A Little 6 Click Score: 22    End of Session   Activity Tolerance: Patient limited by pain Patient left: in bed;with call bell/phone within reach Nurse Communication: Mobility status PT Visit Diagnosis: Difficulty in walking, not elsewhere classified (R26.2);Pain Pain - Right/Left: Right Pain - part of body: Knee     Time: 0920-0937 PT Time Calculation (min) (ACUTE ONLY): 17 min  Charges:  $Therapeutic Exercise: 8-22 mins                     Anastasio Champion, DPT  Acute Rehabilitation  Services Pager [phone] Office [place] 01/04/2019, 9:42 AM

## 2019-01-05 NOTE — Telephone Encounter (Signed)
Okay to increase the pain meds to 10 mg.  Let me know when you run out also the blood in urine is likely from the In-N-Out cath at the end of the case.  That should resolve by 1 week postopopls clala thx

## 2019-01-07 NOTE — Discharge Summary (Signed)
Physician Discharge Summary      Patient ID: Katherine Lewis MRN: [id number] DOB/AGE: 1970/03/02 48 y.o.  Admit date: 01/02/2019 Discharge date: 01/04/2019  Admission Diagnoses:  Active Problems:   Arthritis of right knee   Discharge Diagnoses:  Same  Surgeries: Procedure(s): RIGHT TOTAL KNEE ARTHROPLASTY on 01/02/2019   Consultants:   Discharged Condition: Stable  Hospital Course: Katherine Lewis is an 48 y.o. female who was admitted 01/02/2019 with a chief complaint of right knee pain, and found to have a diagnosis of right knee arthritis.  They were brought to the operating room on 01/02/2019 and underwent the above named procedures.  Pt awoke from anesthesia without complication and was transferred to the floor. On POD1, patient complained of moderate to severe pain with resolution of anesthetic block.  She had difficulty with mobilization in PT as well.  Her pain control and mobilization improved by POD2 and she was subsequently discharged home.  Pt will f/u with Dr. Marlou Sa in clinic in ~2 weeks.   Antibiotics given:  Anti-infectives (From admission, onward)   Start     Dose/Rate Route Frequency Ordered Stop   01/03/19 1000  minocycline (MINOCIN) capsule 100 mg  Status:  Discontinued     100 mg Oral 2 times daily 01/03/19 0845 01/04/19 1356   01/02/19 1600  ceFAZolin (ANCEF) IVPB 2g/100 mL premix     2 g 200 mL/hr over 30 Minutes Intravenous Every 6 hours 01/02/19 1326 01/02/19 2230   01/02/19 0600  ceFAZolin (ANCEF) IVPB 2g/100 mL premix     2 g 200 mL/hr over 30 Minutes Intravenous On call to O.R. 01/02/19 4098 01/02/19 0802    .  Recent vital signs:  Vitals:   01/04/19 0312 01/04/19 0828  BP: (!) 141/61 132/77  Pulse: 78 82  Resp: 17 17  Temp: 98.6 F (37 C) 98.1 F (36.7 C)  SpO2: 97% 92%    Recent laboratory studies:  Results for orders placed or performed during the hospital encounter of 01/02/19  Pregnancy, urine POC  Result Value Ref Range   Preg Test,  Ur NEGATIVE NEGATIVE    Discharge Medications:   Allergies as of 01/04/2019      Reactions   Codeine Nausea Only      Medication List    TAKE these medications   aspirin 81 MG chewable tablet Chew 1 tablet (81 mg total) by mouth 2 (two) times daily.   docusate sodium 100 MG capsule Commonly known as: COLACE Take 1 capsule (100 mg total) by mouth 2 (two) times daily.   escitalopram 5 MG tablet Commonly known as: LEXAPRO Take 1 tablet (5 mg total) by mouth daily.   levonorgestrel 20 MCG/24HR IUD Commonly known as: MIRENA 1 each by Intrauterine route once. Reported on 06/23/2015   losartan-hydrochlorothiazide 50-12.5 MG tablet Commonly known as: HYZAAR TAKE (1/2) TABLET DAILY. What changed:   how much to take  how to take this  when to take this  additional instructions   methocarbamol 500 MG tablet Commonly known as: ROBAXIN Take 1 tablet (500 mg total) by mouth every 8 (eight) hours as needed for muscle spasms.   minocycline 100 MG tablet Commonly known as: DYNACIN Take 1 tablet (100 mg total) by mouth 2 (two) times daily.   oxyCODONE 5 MG immediate release tablet Commonly known as: Oxy IR/ROXICODONE Take 1 tablet (5 mg total) by mouth every 4 (four) hours as needed for moderate pain (pain score 4-6).   pantoprazole 40 MG tablet Commonly  known as: PROTONIX Take 1 tablet (40 mg total) by mouth daily.       Diagnostic Studies: No results found.  Disposition: Discharge disposition: 01-Home or Self Care       Discharge Instructions    Call MD / Call 911   Complete by: As directed    If you experience chest pain or shortness of breath, CALL 911 and be transported to the hospital emergency room.  If you develope a fever above 101 F, pus (white drainage) or increased drainage or redness at the wound, or calf pain, call your surgeon's office.   Constipation Prevention   Complete by: As directed    Drink plenty of fluids.  Prune juice may be helpful.  You  may use a stool softener, such as Colace (over the counter) 100 mg twice a day.  Use MiraLax (over the counter) for constipation as needed.   Diet - low sodium heart healthy   Complete by: As directed    Discharge instructions   Complete by: As directed    You may shower, dressing is waterproof.  Do not remove the dressing, we will remove it at your first post-op appointment.  Do not take a bath or soak the knee in a tub or pool.  You may weightbear as you can tolerate on the operative leg with a walker.  Continue using the CPM machine 3 times per day for one hour each time, increasing the degrees of range of motion daily.  Use the blue cradle boot under your heel to work on getting your leg straight.  Do NOT put a pillow under your knee.  You will follow-up with Dr. Marlou Sa in the clinic in 1-2 weeks at your given appointment date.   Increase activity slowly as tolerated   Complete by: As directed       Follow-up Information    [facility] Follow up.   Specialty: Home Health Services Why: Home Health physical therapy Contact information: [place] [address] [phone]        [facility] Follow up.   Why: bedside commode and rolling walker Contact information: [place] [address] [phone]            Signed: Donella Stade 01/07/2019, 2:53 PM

## 2019-01-08 ENCOUNTER — Other Ambulatory Visit: Payer: Self-pay | Admitting: Surgical

## 2019-01-08 ENCOUNTER — Telehealth: Payer: Self-pay | Admitting: Orthopedic Surgery

## 2019-01-08 MED ORDER — OXYCODONE-ACETAMINOPHEN 10-325 MG PO TABS: 1.0000 | ORAL_TABLET | ORAL | 0 refills | Status: DC | PRN

## 2019-01-08 NOTE — Telephone Encounter (Signed)
Please advise 

## 2019-01-08 NOTE — Telephone Encounter (Signed)
Patient called. Says she needs a refill on her pain medication and that she would take her last one at 12 noon today. Her call back number is [phone]

## 2019-01-08 NOTE — Telephone Encounter (Signed)
Refill was submitted and patient was messaged via mychart ~10 AM

## 2019-01-10 ENCOUNTER — Ambulatory Visit (INDEPENDENT_AMBULATORY_CARE_PROVIDER_SITE_OTHER): Payer: PRIVATE HEALTH INSURANCE

## 2019-01-10 ENCOUNTER — Other Ambulatory Visit: Payer: Self-pay

## 2019-01-10 ENCOUNTER — Ambulatory Visit (INDEPENDENT_AMBULATORY_CARE_PROVIDER_SITE_OTHER): Payer: PRIVATE HEALTH INSURANCE | Admitting: Orthopedic Surgery

## 2019-01-10 ENCOUNTER — Encounter: Payer: Self-pay | Admitting: Orthopedic Surgery

## 2019-01-10 DIAGNOSIS — Z96651 Presence of right artificial knee joint: Secondary | ICD-10-CM | POA: Diagnosis not present

## 2019-01-10 DIAGNOSIS — M1711 Unilateral primary osteoarthritis, right knee: Secondary | ICD-10-CM

## 2019-01-10 MED ORDER — OXYCODONE-ACETAMINOPHEN 5-325 MG PO TABS: ORAL_TABLET | ORAL | 0 refills | Status: DC

## 2019-01-11 ENCOUNTER — Encounter: Payer: Self-pay | Admitting: Orthopedic Surgery

## 2019-01-11 NOTE — Progress Notes (Signed)
Post-Op Visit Note   Patient: Katherine Lewis           Date of Birth: December 31, 1970           MRN: [id number] Visit Date: 01/10/2019 PCP: Katherine Lung, MD   Assessment & Plan:  Chief Complaint: No chief complaint on file.  Visit Diagnoses:  1. Unilateral primary osteoarthritis, right knee   2. Status post total right knee replacement     Plan: Katherine Lewis is a patient is now 2 weeks out right total knee replacement.  On exam she is lacking about 10 degrees of full extension but has flexion easily to 90.  Incision looks good.  Radiographs also look good.  I like her to start outpatient physical therapy.  Refill Percocet.  She has not been using a walker.  She is making good progress with her mobility.  Come back in 4 weeks for clinical recheck.  No calf tenderness negative Homans today.  Follow-Up Instructions: No follow-ups on file.   Orders:  Orders Placed This Encounter  Procedures  . XR Knee 1-2 Views Right  . Ambulatory referral to Physical Therapy   Meds ordered this encounter  Medications  . oxyCODONE-acetaminophen (PERCOCET/ROXICET) 5-325 MG tablet    Sig: 1 po q 4-6 hrs prn pain    Dispense:  42 tablet    Refill:  0    Imaging: No results found.  PMFS History: Patient Active Problem List   Diagnosis Date Noted  . Arthritis of right knee 01/02/2019  . Essential hypertension 11/21/2018  . Obesity (BMI 35.0-39.9 without comorbidity) 11/21/2018  . Dysthymia 11/21/2018  . IUD (intrauterine device) in place 12/19/2017  . Fatigue 12/19/2017  . Mild sleep apnea 12/19/2017  . Acne 12/19/2017  . Dysuria 12/19/2017  . 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 Chronic cough 04/03/2012   Past Medical History:  Diagnosis Date  . Anxiety   . Depression   . GERD (gastroesophageal reflux disease)   . Hypertension   . Hypothyroidism   . Insomnia   . Plantar fasciitis   . Sleep apnea    does not wear Cpap  . Vitamin D deficiency     Family History    Problem Relation Age of Onset  . Diabetes type II Mother   . Diabetes Mother   . Lewis cancer Paternal Grandmother   . Lewis cancer Paternal Grandfather     Past Surgical History:  Procedure Laterality Date  . LAPAROSCOPIC GASTRIC SLEEVE RESECTION N/A 04/23/2014   Procedure: LAPAROSCOPIC GASTRIC SLEEVE RESECTION WITH UPPER ENDOSCOPY;  Surgeon: Katherine Hausen, MD;  Location: [facility];  Service: General;  Laterality: N/A;  . MYRINGOTOMY     bilateral   . TOTAL KNEE ARTHROPLASTY Right 01/02/2019   Procedure: RIGHT TOTAL KNEE ARTHROPLASTY;  Surgeon: Katherine Pel, MD;  Location: [facility];  Service: Orthopedics;  Laterality: Right;   Social History   Occupational History  . Occupation: Science writer: Programmer, applications  Tobacco Use  . Smoking status: Former Smoker    Packs/day: 0.50    Years: 6.00    Pack years: 3.00    Types: Cigarettes    Quit date: 01/19/1992    Years since quitting: 26.9  . Smokeless tobacco: Never Used  . Tobacco comment: social   Substance and Sexual Activity  . Alcohol use: Yes    Comment: social   . Drug use: No  . Sexual activity: Yes    Birth  control/protection: I.U.D.    Comment: Mirena IUD

## 2019-01-17 ENCOUNTER — Inpatient Hospital Stay: Payer: PRIVATE HEALTH INSURANCE | Admitting: Family Medicine

## 2019-01-21 ENCOUNTER — Encounter: Payer: Self-pay | Admitting: Orthopedic Surgery

## 2019-01-22 ENCOUNTER — Other Ambulatory Visit: Payer: Self-pay

## 2019-01-22 ENCOUNTER — Other Ambulatory Visit: Payer: Self-pay | Admitting: Surgical

## 2019-01-22 ENCOUNTER — Telehealth: Payer: Self-pay | Admitting: Orthopedic Surgery

## 2019-01-22 MED ORDER — OXYCODONE-ACETAMINOPHEN 5-325 MG PO TABS: 1.0000 | ORAL_TABLET | ORAL | 0 refills | Status: DC | PRN

## 2019-01-22 NOTE — Telephone Encounter (Signed)
See other note. Patient sent mychart message.

## 2019-01-22 NOTE — Telephone Encounter (Signed)
Dean patient

## 2019-01-22 NOTE — Telephone Encounter (Signed)
Pt called in checking on her request she sent through my chart for a refill on her pain medication Oxycodone, pt said she does run out of her medication tonight. Please have that sent to [facility].   [phone]

## 2019-01-22 NOTE — Telephone Encounter (Signed)
Ok to rf? 

## 2019-01-25 ENCOUNTER — Encounter: Payer: Self-pay | Admitting: Orthopedic Surgery

## 2019-01-25 ENCOUNTER — Other Ambulatory Visit: Payer: Self-pay | Admitting: Surgical

## 2019-01-25 MED ORDER — OXYCODONE-ACETAMINOPHEN 10-325 MG PO TABS: 1.0000 | ORAL_TABLET | ORAL | 0 refills | Status: DC | PRN

## 2019-01-25 MED ORDER — MELOXICAM 15 MG PO TABS: 15.0000 mg | ORAL_TABLET | Freq: Every day | ORAL | 0 refills | Status: DC

## 2019-01-25 NOTE — Telephone Encounter (Signed)
Ok for 10 q 4 and add mobic 15 q d and tylenol 500 po tid pls clasal htx

## 2019-02-08 ENCOUNTER — Other Ambulatory Visit: Payer: Self-pay

## 2019-02-08 ENCOUNTER — Ambulatory Visit (INDEPENDENT_AMBULATORY_CARE_PROVIDER_SITE_OTHER): Payer: PRIVATE HEALTH INSURANCE | Admitting: Orthopedic Surgery

## 2019-02-08 ENCOUNTER — Encounter: Payer: Self-pay | Admitting: Orthopedic Surgery

## 2019-02-08 DIAGNOSIS — M1711 Unilateral primary osteoarthritis, right knee: Secondary | ICD-10-CM

## 2019-02-08 MED ORDER — OXYCODONE-ACETAMINOPHEN 10-325 MG PO TABS: 1.0000 | ORAL_TABLET | Freq: Three times a day (TID) | ORAL | 0 refills | Status: AC | PRN

## 2019-02-08 NOTE — Progress Notes (Signed)
Post-Op Visit Note   Patient: Katherine Lewis           Date of Birth: 05/04/1970           MRN: [id number] Visit Date: 02/08/2019 PCP: Denita Lung, MD   Assessment & Plan:  Chief Complaint:  Chief Complaint  Patient presents with  . Right Knee - Routine Post Op   Visit Diagnoses:  1. Unilateral primary osteoarthritis, right knee     Plan: Patient is a 49 year old female who presents s/p right total knee arthroplasty on 01/02/2019.  Patient notes that she is doing well overall aside from some occasional lateral pain and popping.  She is been going to physical therapy agrees for physical therapy and notes that this is going well.  She has 0 degrees of extension and 95 degrees of flexion.  Her incision is healing well.  She has no calf tenderness and negative Homans' sign.  She has been compliant with taking aspirin.  Patient is still taking Percocet 10 mg but only at night now.  We will refill this 1 time before reducing her dose to 5 mg at the next refill.  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  Continue outpatient physical therapy with home exercise program.  Plan for follow-up in 6 weeks.  Patient agrees with plan.    Patient also requests referral for right little toe pain.  She notes that she has had a prior fracture that malunited.  Plan to refer patient to Dr. Sharol Given to discuss options.  Follow-Up Instructions: No follow-ups on file.   Orders:  No orders of the defined types were placed in this encounter.  No orders of the defined types were placed in this encounter.   Imaging: No results found.  PMFS History: Patient Active Problem List   Diagnosis Date Noted  . Arthritis of right knee 01/02/2019  . Essential hypertension 11/21/2018  . Obesity (BMI 35.0-39.9 without comorbidity) 11/21/2018  . Dysthymia 11/21/2018  . IUD (intrauterine device) in place 12/19/2017   . Fatigue 12/19/2017  . Mild sleep apnea 12/19/2017  . Acne 12/19/2017  . Dysuria 12/19/2017  . 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 Chronic cough 04/03/2012   Past Medical History:  Diagnosis Date  . Anxiety   . Depression   . GERD (gastroesophageal reflux disease)   . Hypertension   . Hypothyroidism   . Insomnia   . Plantar fasciitis   . Sleep apnea    does not wear Cpap  . Vitamin D deficiency     Family History  Problem Relation Age of Onset  . Diabetes type II Mother   . Diabetes Mother   . Lung cancer Paternal Grandmother   . Lung cancer Paternal Grandfather     Past Surgical History:  Procedure Laterality Date  . LAPAROSCOPIC GASTRIC SLEEVE RESECTION N/A 04/23/2014   Procedure: LAPAROSCOPIC GASTRIC SLEEVE RESECTION WITH UPPER ENDOSCOPY;  Surgeon: Johnathan Hausen, MD;  Location: [facility];  Service: General;  Laterality: N/A;  . MYRINGOTOMY     bilateral   . TOTAL KNEE ARTHROPLASTY Right 01/02/2019   Procedure: RIGHT TOTAL KNEE ARTHROPLASTY;  Surgeon: Meredith Pel, MD;  Location: [facility];  Service: Orthopedics;  Laterality: Right;   Social History   Occupational History  . Occupation: Science writer: Programmer, applications  Tobacco Use  .  Smoking status: Former Smoker    Packs/day: 0.50    Years: 6.00    Pack years: 3.00    Types: Cigarettes    Quit date: 01/19/1992    Years since quitting: 27.0  . Smokeless tobacco: Never Used  . Tobacco comment: social   Substance and Sexual Activity  . Alcohol use: Yes    Comment: social   . Drug use: No  . Sexual activity: Yes    Birth control/protection: I.U.D.    Comment: Mirena IUD

## 2019-02-09 ENCOUNTER — Encounter: Payer: Self-pay | Admitting: Orthopedic Surgery

## 2019-02-12 ENCOUNTER — Encounter: Payer: Self-pay | Admitting: Orthopedic Surgery

## 2019-02-12 ENCOUNTER — Ambulatory Visit (INDEPENDENT_AMBULATORY_CARE_PROVIDER_SITE_OTHER): Payer: PRIVATE HEALTH INSURANCE | Admitting: Orthopedic Surgery

## 2019-02-12 ENCOUNTER — Other Ambulatory Visit: Payer: Self-pay

## 2019-02-12 VITALS — Ht 65.0 in | Wt 240.0 lb

## 2019-02-12 DIAGNOSIS — M79671 Pain in right foot: Secondary | ICD-10-CM

## 2019-02-12 NOTE — Progress Notes (Signed)
Office Visit Note   Patient: Katherine Lewis           Date of Birth: Sep 16, 1970           MRN: [id number] Visit Date: 02/12/2019              Requested by: Denita Lung, MD [address] [place],  [place] [address] PCP: Denita Lung, MD  Chief Complaint  Patient presents with  . Right Foot - Pain      HPI: Patient is a 49 year old woman who was seen for evaluation for pain in the right foot fourth and fifth toe PIP joints.  She is status post proximal phalanx head resection by Dr. Paulla Dolly years ago she states she has more pain now in the toes than she did prior to surgery.  Patient is inquiring whether revision surgical intervention is an option.  Assessment & Plan: Visit Diagnoses:  1. Pain in right foot     Plan: I discussed that further bone resection could make her toes more unstable and could make things worse.  Recommended a stiff soled shoe and stretching of the shoe to accommodate her foot.  Follow-Up Instructions: Return if symptoms worsen or fail to improve.   Ortho Exam  Patient is alert, oriented, no adenopathy, well-dressed, normal affect, normal respiratory effort. Examination patient is a good dorsalis pedis pulse she has good ankle good subtalar motion.  Her fourth and fifth toes are straight she is tender to palpation over the PIP joints of both toes.  Radiographs are reviewed which shows resection of the metatarsal head of the proximal phalanx of the fourth and fifth toes.  Imaging: No results found. No images are attached to the encounter.  Labs: Lab Results  Component Value Date   HGBA1C 5.2 01/17/2014   REPTSTATUS 12/27/2018 FINAL 12/26/2018   CULT  12/26/2018    NO GROWTH Performed at [facility], [place] [address], [place], [place] [address]      Lab Results  Component Value Date   ALBUMIN 4.2 11/21/2018   ALBUMIN 4.7 04/18/2014   ALBUMIN 4.0 01/17/2014    No results found for: MG No results found for: VD25OH  No  results found for: PREALBUMIN CBC EXTENDED Latest Ref Rng & Units 12/26/2018 11/21/2018 04/24/2014  WBC 4.0 - 10.5 K/uL 8.0 8.2 -  RBC 3.87 - 5.11 MIL/uL 4.70 4.77 -  HGB 12.0 - 15.0 g/dL 15.0 15.4 12.4  HCT 36.0 - 46.0 % 43.5 44.2 36.9  PLT 150 - 400 K/uL 230 268 -  NEUTROABS 1.4 - 7.0 x10E3/uL - 4.6 -  LYMPHSABS 0.7 - 3.1 x10E3/uL - 2.8 -     Body mass index is 39.94 kg/m.  Orders:  No orders of the defined types were placed in this encounter.  No orders of the defined types were placed in this encounter.    Procedures: No procedures performed  Clinical Data: No additional findings.  ROS:  All other systems negative, except as noted in the HPI. Review of Systems  Objective: Vital Signs: Ht 5' 5"  (1.651 m)   Wt 240 lb (108.9 kg)   BMI 39.94 kg/m   Specialty Comments:  No specialty comments available.  PMFS History: Patient Active Problem List   Diagnosis Date Noted  . Arthritis of right knee 01/02/2019  . Essential hypertension 11/21/2018  . Obesity (BMI 35.0-39.9 without comorbidity) 11/21/2018  . Dysthymia 11/21/2018  . IUD (intrauterine device) in place 12/19/2017  . Fatigue 12/19/2017  .  Mild sleep apnea 12/19/2017  . Acne 12/19/2017  . Dysuria 12/19/2017  . 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 Chronic cough 04/03/2012   Past Medical History:  Diagnosis Date  . Anxiety   . Depression   . GERD (gastroesophageal reflux disease)   . Hypertension   . Hypothyroidism   . Insomnia   . Plantar fasciitis   . Sleep apnea    does not wear Cpap  . Vitamin D deficiency     Family History  Problem Relation Age of Onset  . Diabetes type II Mother   . Diabetes Mother   . Lung cancer Paternal Grandmother   . Lung cancer Paternal Grandfather     Past Surgical History:  Procedure Laterality Date  . LAPAROSCOPIC GASTRIC SLEEVE RESECTION N/A 04/23/2014   Procedure: LAPAROSCOPIC GASTRIC SLEEVE RESECTION WITH UPPER ENDOSCOPY;  Surgeon:  Johnathan Hausen, MD;  Location: [facility];  Service: General;  Laterality: N/A;  . MYRINGOTOMY     bilateral   . TOTAL KNEE ARTHROPLASTY Right 01/02/2019   Procedure: RIGHT TOTAL KNEE ARTHROPLASTY;  Surgeon: Meredith Pel, MD;  Location: [facility];  Service: Orthopedics;  Laterality: Right;   Social History   Occupational History  . Occupation: Science writer: Programmer, applications  Tobacco Use  . Smoking status: Former Smoker    Packs/day: 0.50    Years: 6.00    Pack years: 3.00    Types: Cigarettes    Quit date: 01/19/1992    Years since quitting: 27.0  . Smokeless tobacco: Never Used  . Tobacco comment: social   Substance and Sexual Activity  . Alcohol use: Yes    Comment: social   . Drug use: No  . Sexual activity: Yes    Birth control/protection: I.U.D.    Comment: Mirena IUD

## 2019-03-22 ENCOUNTER — Other Ambulatory Visit: Payer: Self-pay

## 2019-03-22 ENCOUNTER — Ambulatory Visit: Payer: PRIVATE HEALTH INSURANCE | Attending: Internal Medicine

## 2019-03-22 ENCOUNTER — Ambulatory Visit (INDEPENDENT_AMBULATORY_CARE_PROVIDER_SITE_OTHER): Payer: PRIVATE HEALTH INSURANCE | Admitting: Orthopedic Surgery

## 2019-03-22 ENCOUNTER — Encounter: Payer: Self-pay | Admitting: Orthopedic Surgery

## 2019-03-22 DIAGNOSIS — Z96651 Presence of right artificial knee joint: Secondary | ICD-10-CM

## 2019-03-22 DIAGNOSIS — Z23 Encounter for immunization: Secondary | ICD-10-CM | POA: Insufficient documentation

## 2019-03-22 MED ORDER — AMOXICILLIN 500 MG PO TABS: ORAL_TABLET | ORAL | 0 refills | Status: DC

## 2019-03-22 NOTE — Progress Notes (Signed)
Post-Op Visit Note   Patient: Katherine Lewis           Date of Birth: Jul 14, 1970           MRN: [id number] Visit Date: 03/22/2019 PCP: Denita Lung, MD   Assessment & Plan:  Chief Complaint:  Chief Complaint  Patient presents with  . Right Knee - Follow-up   Visit Diagnoses:  1. Status post total right knee replacement     Plan: Avon is now about 2 and half months out right total knee replacement.  She has been doing well.  Having a little bit more stiffness recently.  No fevers or chills.  She did get her teeth cleaned this morning and did not take any antibiotics but we did prescribe them for her to take today.  Progressing out of physical therapy with [facility] and into physical therapy at [facility].  Cannot take anti-inflammatories because of bariatric surgery.  On exam she has excellent patella mobility and range of motion 0-1 10.  Plan at this time is to continue quad strengthening and stretching.  She is having little low back pain as well but she does not really want to work that up yet.  I had like to see her back in about 6 months for final clinical recheck.  She is asking a little bit about some posterior clicking.  She did have a loose body that we took out at the time. She may have some popliteus rubbing over the tibial implant.  Difficult to say but not a huge problem at this time.  We will recheck that in 6 months also. Follow-Up Instructions: Return in about 9 months (around 12/22/2019).   Orders:  No orders of the defined types were placed in this encounter.  Meds ordered this encounter  Medications  . amoxicillin (AMOXIL) 500 MG tablet    Sig: 2g po prior to dental procedure    Dispense:  10 tablet    Refill:  0    Imaging: No results found.  PMFS History: Patient Active Problem List   Diagnosis Date Noted  . Arthritis of right knee 01/02/2019  . Essential hypertension 11/21/2018  . Obesity (BMI 35.0-39.9 without comorbidity) 11/21/2018  . Dysthymia  11/21/2018  . IUD (intrauterine device) in place 12/19/2017  . Fatigue 12/19/2017  . Mild sleep apnea 12/19/2017  . Acne 12/19/2017  . Dysuria 12/19/2017  . 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 Chronic cough 04/03/2012   Past Medical History:  Diagnosis Date  . Anxiety   . Depression   . GERD (gastroesophageal reflux disease)   . Hypertension   . Hypothyroidism   . Insomnia   . Plantar fasciitis   . Sleep apnea    does not wear Cpap  . Vitamin D deficiency     Family History  Problem Relation Age of Onset  . Diabetes type II Mother   . Diabetes Mother   . Lung cancer Paternal Grandmother   . Lung cancer Paternal Grandfather     Past Surgical History:  Procedure Laterality Date  . LAPAROSCOPIC GASTRIC SLEEVE RESECTION N/A 04/23/2014   Procedure: LAPAROSCOPIC GASTRIC SLEEVE RESECTION WITH UPPER ENDOSCOPY;  Surgeon: Johnathan Hausen, MD;  Location: [facility];  Service: General;  Laterality: N/A;  . MYRINGOTOMY     bilateral   . TOTAL KNEE ARTHROPLASTY Right 01/02/2019   Procedure: RIGHT TOTAL KNEE ARTHROPLASTY;  Surgeon: Meredith Pel, MD;  Location: [facility];  Service: Orthopedics;  Laterality:  Right;   Social History   Occupational History  . Occupation: Science writer: Programmer, applications  Tobacco Use  . Smoking status: Former Smoker    Packs/day: 0.50    Years: 6.00    Pack years: 3.00    Types: Cigarettes    Quit date: 01/19/1992    Years since quitting: 27.1  . Smokeless tobacco: Never Used  . Tobacco comment: social   Substance and Sexual Activity  . Alcohol use: Yes    Comment: social   . Drug use: No  . Sexual activity: Yes    Birth control/protection: I.U.D.    Comment: Mirena IUD

## 2019-03-22 NOTE — Progress Notes (Signed)
   Covid-19 Vaccination Clinic  Name:  Daylan Boggess    MRN: [id number] DOB: 05/02/1970  03/22/2019  Ms. Koehn was observed post Covid-19 immunization for 15 minutes without incident. She was provided with Vaccine Information Sheet and instruction to access the V-Safe system.   Ms. Kobrin was instructed to call 911 with any severe reactions post vaccine: Marland Kitchen Difficulty breathing  . Swelling of face and throat  . A fast heartbeat  . A bad rash all over body  . Dizziness and weakness

## 2019-04-14 ENCOUNTER — Other Ambulatory Visit: Payer: Self-pay | Admitting: Family Medicine

## 2019-04-16 NOTE — Telephone Encounter (Signed)
[facility] is requesting to fill pt tessalon. Medicine was not listed in pt chart. Please advise [facility]

## 2019-04-18 ENCOUNTER — Ambulatory Visit: Payer: PRIVATE HEALTH INSURANCE

## 2019-05-01 ENCOUNTER — Ambulatory Visit: Payer: PRIVATE HEALTH INSURANCE

## 2019-07-06 ENCOUNTER — Other Ambulatory Visit: Payer: Self-pay | Admitting: Family Medicine

## 2019-09-30 ENCOUNTER — Other Ambulatory Visit: Payer: Self-pay | Admitting: Family Medicine

## 2019-10-23 ENCOUNTER — Other Ambulatory Visit: Payer: Self-pay

## 2019-10-23 ENCOUNTER — Ambulatory Visit (INDEPENDENT_AMBULATORY_CARE_PROVIDER_SITE_OTHER): Payer: PRIVATE HEALTH INSURANCE | Admitting: Family Medicine

## 2019-10-23 ENCOUNTER — Encounter: Payer: Self-pay | Admitting: Family Medicine

## 2019-10-23 VITALS — BP 120/84 | HR 72 | Temp 97.7°F | Ht 64.0 in | Wt 255.0 lb

## 2019-10-23 DIAGNOSIS — G4730 Sleep apnea, unspecified: Secondary | ICD-10-CM | POA: Diagnosis not present

## 2019-10-23 DIAGNOSIS — Z23 Encounter for immunization: Secondary | ICD-10-CM | POA: Diagnosis not present

## 2019-10-23 DIAGNOSIS — Z1211 Encounter for screening for malignant neoplasm of colon: Secondary | ICD-10-CM

## 2019-10-23 DIAGNOSIS — E669 Obesity, unspecified: Secondary | ICD-10-CM

## 2019-10-23 DIAGNOSIS — Z96651 Presence of right artificial knee joint: Secondary | ICD-10-CM

## 2019-10-23 DIAGNOSIS — G8929 Other chronic pain: Secondary | ICD-10-CM

## 2019-10-23 DIAGNOSIS — Z9884 Bariatric surgery status: Secondary | ICD-10-CM | POA: Diagnosis not present

## 2019-10-23 DIAGNOSIS — E781 Pure hyperglyceridemia: Secondary | ICD-10-CM | POA: Diagnosis not present

## 2019-10-23 DIAGNOSIS — F341 Dysthymic disorder: Secondary | ICD-10-CM

## 2019-10-23 DIAGNOSIS — K219 Gastro-esophageal reflux disease without esophagitis: Secondary | ICD-10-CM

## 2019-10-23 DIAGNOSIS — M545 Low back pain, unspecified: Secondary | ICD-10-CM

## 2019-10-23 DIAGNOSIS — Z0000 Encounter for general adult medical examination without abnormal findings: Secondary | ICD-10-CM

## 2019-10-23 DIAGNOSIS — Z975 Presence of (intrauterine) contraceptive device: Secondary | ICD-10-CM

## 2019-10-23 DIAGNOSIS — Z5320 Procedure and treatment not carried out because of patient's decision for unspecified reasons: Secondary | ICD-10-CM

## 2019-10-23 DIAGNOSIS — I10 Essential (primary) hypertension: Secondary | ICD-10-CM

## 2019-10-23 NOTE — Patient Instructions (Signed)
Chronic Back Pain When back pain lasts longer than 3 months, it is called chronic back pain. Pain may get worse at certain times (flare-ups). There are things you can do at home to manage your pain. Follow these instructions at home: Activity      Avoid bending and other activities that make pain worse.  When standing: ? Keep your upper back and neck straight. ? Keep your shoulders pulled back. ? Avoid slouching.  When sitting: ? Keep your back straight. ? Relax your shoulders. Do not round your shoulders or pull them backward.  Do not sit or stand in one place for long periods of time.  Take short rest breaks during the day. Lying down or standing is usually better than sitting. Resting can help relieve pain.  When sitting or lying down for a long time, do some mild activity or stretching. This will help to prevent stiffness and pain.  Get regular exercise. Ask your doctor what activities are safe for you.  Do not lift anything that is heavier than 10 lb (4.5 kg). To prevent injury when you lift things: ? Bend your knees. ? Keep the weight close to your body. ? Avoid twisting. Managing pain  If told, put ice on the painful area. Your doctor may tell you to use ice for 24-48 hours after a flare-up starts. ? Put ice in a plastic bag. ? Place a towel between your skin and the bag. ? Leave the ice on for 20 minutes, 2-3 times a day.  If told, put heat on the painful area as often as told by your doctor. Use the heat source that your doctor recommends, such as a moist heat pack or a heating pad. ? Place a towel between your skin and the heat source. ? Leave the heat on for 20-30 minutes. ? Remove the heat if your skin turns bright red. This is especially important if you are unable to feel pain, heat, or cold. You may have a greater risk of getting burned.  Soak in a warm bath. This can help relieve pain.  Take over-the-counter and prescription medicines only as told by your  doctor. General instructions  Sleep on a firm mattress. Try lying on your side with your knees slightly bent. If you lie on your back, put a pillow under your knees.  Keep all follow-up visits as told by your doctor. This is important. Contact a doctor if:  You have pain that does not get better with rest or medicine. Get help right away if:  One or both of your arms or legs feel weak.  One or both of your arms or legs lose feeling (numbness).  You have trouble controlling when you poop (bowel movement) or pee (urinate).  You feel sick to your stomach (nauseous).  You throw up (vomit).  You have belly (abdominal) pain.  You have shortness of breath.  You pass out (faint). Summary  When back pain lasts longer than 3 months, it is called chronic back pain.  Pain may get worse at certain times (flare-ups).  Use ice and heat as told by your doctor. Your doctor may tell you to use ice after flare-ups. This information is not intended to replace advice given to you by your health care provider. Make sure you discuss any questions you have with your health care provider. Document Revised: 04/27/2018 Document Reviewed: 08/19/2016 Elsevier Patient Education  2020 [employer].

## 2019-10-23 NOTE — Progress Notes (Addendum)
   Subjective:    Patient ID: Katherine Lewis, female    DOB: May 09, 1970, 49 y.o.   MRN: [id number]  HPI She is here for a complete examination.  She has had a Covid vaccine and is interested in getting the booster.  She has had gastric sleeve procedure and her weight is relatively stable now although she is still not happy with this.  She has tried Noom but did not find this very useful.  She has also been involved with nutritionist.  She does have a history of OSA but is not on CPAP and apparently does not qualify.  Her exercise is quite minimal as work does keep her quite busy.  She does complain of chronic low back pain that is now interfering with her sleep.  She has tried neuromuscular therapy as well as dry needling.  She does occasionally use Aleve for this.  She has not had a formal evaluation of it.  She did have a right TKR in December and is very happy that she has had this.  She continues on Lexapro for her dysthymia and has no thoughts on stopping that.  She is also taking Wellbutrin.  Continues on losartan/HCTZ.  Her work and home life seem to be very stable.  She does need to see a gynecologist for routine Pap and pelvic.  I did discuss mammogram with her.  Otherwise family and social history as well as health maintenance and immunizations was reviewed   Review of Systems  All other systems reviewed and are negative.      Objective:   Physical Exam Alert and in no distress. Tympanic membranes and canals are normal. Pharyngeal area is normal. Neck is supple without adenopathy or thyromegaly. Cardiac exam shows a regular sinus rhythm without murmurs or gallops. Lungs are clear to auscultation.       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  I will set her up for LS spine to make sure there is nothing going on there and then referred to physical therapy for her back.  Could discuss exercise with her with the idea of taking time out of her day even for 5-minute walks while she is at work.  We will also look into a GLP-1 for her.  She will be set up to see gynecology.  No therapy needed for the sleep apnea.  Continue Protonix for her reflux.  10/13 I called her to discuss the use of medicines help with her weight loss.  Discussed Rybelsus and also once weekly shot.  I will start her on Rybelsus 3 mg.  We will touch bases again in a month.

## 2019-10-24 LAB — COMPREHENSIVE METABOLIC PANEL
ALT: 17 IU/L (ref 0–32)
AST: 17 IU/L (ref 0–40)
Albumin/Globulin Ratio: 1.9 (ref 1.2–2.2)
Albumin: 4.4 g/dL (ref 3.8–4.8)
Alkaline Phosphatase: 68 IU/L (ref 44–121)
BUN/Creatinine Ratio: 13 (ref 9–23)
BUN: 10 mg/dL (ref 6–24)
Bilirubin Total: 0.6 mg/dL (ref 0.0–1.2)
CO2: 26 mmol/L (ref 20–29)
Calcium: 9.5 mg/dL (ref 8.7–10.2)
Chloride: 100 mmol/L (ref 96–106)
Creatinine, Ser: 0.77 mg/dL (ref 0.57–1.00)
GFR calc Af Amer: 106 mL/min/{1.73_m2} (ref >59)
GFR calc non Af Amer: 92 mL/min/{1.73_m2} (ref >59)
Globulin, Total: 2.3 g/dL (ref 1.5–4.5)
Glucose: 88 mg/dL (ref 65–99)
Potassium: 4.2 mmol/L (ref 3.5–5.2)
Sodium: 140 mmol/L (ref 134–144)
Total Protein: 6.7 g/dL (ref 6.0–8.5)

## 2019-10-24 LAB — CBC WITH DIFFERENTIAL/PLATELET
Basophils Absolute: 0 10*3/uL (ref 0.0–0.2)
Basos: 1 %
EOS (ABSOLUTE): 0.1 10*3/uL (ref 0.0–0.4)
Eos: 1 %
Hematocrit: 43.5 % (ref 34.0–46.6)
Hemoglobin: 14.7 g/dL (ref 11.1–15.9)
Immature Grans (Abs): 0 10*3/uL (ref 0.0–0.1)
Immature Granulocytes: 0 %
Lymphocytes Absolute: 2.5 10*3/uL (ref 0.7–3.1)
Lymphs: 30 %
MCH: 31.1 pg (ref 26.6–33.0)
MCHC: 33.8 g/dL (ref 31.5–35.7)
MCV: 92 fL (ref 79–97)
Monocytes Absolute: 0.5 10*3/uL (ref 0.1–0.9)
Monocytes: 6 %
Neutrophils Absolute: 5.1 10*3/uL (ref 1.4–7.0)
Neutrophils: 62 %
Platelets: 258 10*3/uL (ref 150–450)
RBC: 4.73 x10E6/uL (ref 3.77–5.28)
RDW: 13.5 % (ref 11.7–15.4)
WBC: 8.3 10*3/uL (ref 3.4–10.8)

## 2019-10-24 LAB — HEPATITIS C ANTIBODY: Hep C Virus Ab: <0.1 s/co ratio (ref 0.0–0.9)

## 2019-10-31 MED ORDER — RYBELSUS 3 MG PO TABS: 1.0000 | ORAL_TABLET | Freq: Every day | ORAL | 0 refills | Status: DC

## 2019-10-31 NOTE — Addendum Note (Signed)
Addended by: Denita Lung on: 10/31/2019 12:11 PM   Modules accepted: Orders

## 2019-11-02 ENCOUNTER — Ambulatory Visit
Admission: RE | Admit: 2019-11-02 | Discharge: 2019-11-02 | Disposition: A | Discharge status: Home / Self Care | Payer: PRIVATE HEALTH INSURANCE | Source: Ambulatory Visit | Attending: Family Medicine | Admitting: Family Medicine

## 2019-11-02 ENCOUNTER — Other Ambulatory Visit: Payer: Self-pay

## 2019-11-03 ENCOUNTER — Telehealth: Payer: Self-pay

## 2019-11-03 NOTE — Telephone Encounter (Signed)
P.A. [employer]

## 2019-11-06 LAB — COLOGUARD: Cologuard: NEGATIVE

## 2019-11-07 ENCOUNTER — Other Ambulatory Visit: Payer: Self-pay

## 2019-11-07 DIAGNOSIS — G8929 Other chronic pain: Secondary | ICD-10-CM

## 2019-11-07 DIAGNOSIS — M545 Low back pain, unspecified: Secondary | ICD-10-CM

## 2019-11-07 NOTE — Progress Notes (Signed)
Error

## 2019-11-13 ENCOUNTER — Encounter: Payer: Self-pay | Admitting: Family Medicine

## 2019-11-13 NOTE — Telephone Encounter (Signed)
P.A. denied for obesity.  Can we also do labs HGB A1c & Random insulin panel to see if insulin resistant for this pt and try to get this approved ?

## 2019-11-14 LAB — EXTERNAL GENERIC LAB PROCEDURE: COLOGUARD: NEGATIVE

## 2019-11-14 LAB — COLOGUARD: COLOGUARD: NEGATIVE

## 2019-11-14 NOTE — Telephone Encounter (Signed)
Left message for pt

## 2019-11-14 NOTE — Telephone Encounter (Signed)
yes

## 2019-11-14 NOTE — Telephone Encounter (Signed)
Pt informed

## 2019-11-15 ENCOUNTER — Other Ambulatory Visit: Payer: PRIVATE HEALTH INSURANCE

## 2019-11-15 ENCOUNTER — Other Ambulatory Visit: Payer: Self-pay

## 2019-11-15 DIAGNOSIS — Z131 Encounter for screening for diabetes mellitus: Secondary | ICD-10-CM

## 2019-11-16 ENCOUNTER — Encounter: Payer: Self-pay | Admitting: Family Medicine

## 2019-11-16 LAB — HGB A1C W/O EAG: Hgb A1c MFr Bld: 5.2 % (ref 4.8–5.6)

## 2019-11-16 LAB — INSULIN, RANDOM: INSULIN: 28.6 u[IU]/mL — ABNORMAL HIGH (ref 2.6–24.9)

## 2019-11-16 NOTE — Progress Notes (Signed)
sent my chart message advising of negative cologuard. Test. [facility]

## 2019-11-24 ENCOUNTER — Telehealth: Payer: Self-pay

## 2019-11-24 NOTE — Telephone Encounter (Signed)
P.A. [employer] Reasons completed again with diagnosis of Prediabetes & insulin resistant

## 2019-11-27 NOTE — Telephone Encounter (Signed)
FYI, waiting to hear back from new P.A.

## 2019-12-04 LAB — HM MAMMOGRAPHY

## 2019-12-05 ENCOUNTER — Telehealth: Payer: Self-pay

## 2019-12-05 NOTE — Telephone Encounter (Signed)
P.A. denied not covered diagnosis

## 2019-12-05 NOTE — Telephone Encounter (Signed)
P.A. [employer] Reasons resubmitted with impaired fasting glucose diagnosis per rep Anderson Malta

## 2019-12-16 NOTE — Telephone Encounter (Signed)
P.A. denied, appeal letter typed

## 2019-12-17 NOTE — Telephone Encounter (Signed)
Appeal letter faxed to T# [phone]

## 2019-12-31 NOTE — Telephone Encounter (Signed)
Appeal denied states decision upheld that Rybelsus is not approved for indication for "pre-diabetes" or impaired fasting glucose so this would be off label use. Do you want to switch to another weight lose medication?

## 2019-12-31 NOTE — Telephone Encounter (Signed)
Explained to her that we tried to get it covered but they will not cover it.  If she wants to discuss other weight loss meds, she can call for virtual visit

## 2020-01-03 ENCOUNTER — Other Ambulatory Visit: Payer: Self-pay | Admitting: Family Medicine

## 2020-01-03 DIAGNOSIS — F341 Dysthymic disorder: Secondary | ICD-10-CM

## 2020-01-03 NOTE — Telephone Encounter (Signed)
Katherine Lewis is requesting to fill pt lexapro. Please advise [facility]

## 2020-01-18 ENCOUNTER — Telehealth: Payer: Self-pay

## 2020-01-18 NOTE — Telephone Encounter (Signed)
Called pt yesterday and explained situation of insurance denial, she will let us know if she wants to schedule virtual appt to discuss other weight loss options

## 2020-01-18 NOTE — Telephone Encounter (Signed)
error 

## 2020-02-04 ENCOUNTER — Other Ambulatory Visit: Payer: Self-pay | Admitting: Family Medicine

## 2020-02-04 DIAGNOSIS — I10 Essential (primary) hypertension: Secondary | ICD-10-CM

## 2020-03-30 ENCOUNTER — Other Ambulatory Visit: Payer: Self-pay | Admitting: Family Medicine

## 2020-05-03 ENCOUNTER — Other Ambulatory Visit: Payer: Self-pay | Admitting: Family Medicine

## 2020-05-03 DIAGNOSIS — I10 Essential (primary) hypertension: Secondary | ICD-10-CM

## 2020-06-23 ENCOUNTER — Other Ambulatory Visit: Payer: Self-pay | Admitting: Family Medicine

## 2020-06-23 DIAGNOSIS — F341 Dysthymic disorder: Secondary | ICD-10-CM

## 2020-06-23 NOTE — Telephone Encounter (Signed)
[facility] is requesting to fill pt lexapro. Please advise [facility]

## 2020-06-26 ENCOUNTER — Telehealth: Payer: Self-pay

## 2020-06-26 ENCOUNTER — Encounter: Payer: Self-pay | Admitting: Family Medicine

## 2020-06-26 ENCOUNTER — Telehealth (INDEPENDENT_AMBULATORY_CARE_PROVIDER_SITE_OTHER): Payer: PRIVATE HEALTH INSURANCE | Admitting: Family Medicine

## 2020-06-26 VITALS — Ht 65.0 in | Wt 249.0 lb

## 2020-06-26 DIAGNOSIS — Z8616 Personal history of COVID-19: Secondary | ICD-10-CM | POA: Diagnosis not present

## 2020-06-26 DIAGNOSIS — R197 Diarrhea, unspecified: Secondary | ICD-10-CM

## 2020-06-26 NOTE — Telephone Encounter (Signed)
Lvm for pt to call back. [place]

## 2020-06-26 NOTE — Progress Notes (Signed)
   Subjective:    Patient ID: Katherine Lewis, female    DOB: [birth year], 50 y.o.   MRN: [id number]  HPI Documentation for virtual audio and video telecommunications through [place] encounter: The patient was located at home. 2 patient identifiers used.  The provider was located in the office. The patient did consent to this visit and is aware of possible charges through their insurance for this visit. The other persons participating in this telemedicine service were none. Time spent on call was 5 minutes and in review of previous records >20 minutes total for counseling and coordination of care. This virtual service is not related to other E/M service within previous 7 days.  She developed COVID on May 10 through 29 but then after that started having difficulty with worsening of fatigue, headache, diarrhea having several loose stools per day as well as arthralgias.  Review of Systems     Objective:   Physical Exam Alert and in no distress otherwise not examined       Assessment & Plan:  History of COVID-19  Diarrhea, unspecified type Recommend Tylenol for the headache aches and pains.  Also we will set her up to come in for an exam since she has had so much diarrhea, we need to do further evaluation on that.

## 2020-06-27 ENCOUNTER — Ambulatory Visit: Payer: PRIVATE HEALTH INSURANCE | Admitting: Family Medicine

## 2020-06-27 ENCOUNTER — Encounter: Payer: Self-pay | Admitting: Family Medicine

## 2020-06-27 VITALS — BP 112/84 | HR 78 | Temp 97.3°F | Wt 255.8 lb

## 2020-06-27 DIAGNOSIS — R197 Diarrhea, unspecified: Secondary | ICD-10-CM

## 2020-06-27 DIAGNOSIS — Z8616 Personal history of COVID-19: Secondary | ICD-10-CM

## 2020-06-27 LAB — CBC WITH DIFFERENTIAL/PLATELET
Basophils Absolute: 0.1 10*3/uL (ref 0.0–0.2)
Basos: 1 %
EOS (ABSOLUTE): 0.1 10*3/uL (ref 0.0–0.4)
Eos: 2 %
Hematocrit: 41.3 % (ref 34.0–46.6)
Hemoglobin: 14.2 g/dL (ref 11.1–15.9)
Immature Grans (Abs): 0 10*3/uL (ref 0.0–0.1)
Immature Granulocytes: 1 %
Lymphocytes Absolute: 2.1 10*3/uL (ref 0.7–3.1)
Lymphs: 30 %
MCH: 30.7 pg (ref 26.6–33.0)
MCHC: 34.4 g/dL (ref 31.5–35.7)
MCV: 89 fL (ref 79–97)
Monocytes Absolute: 0.4 10*3/uL (ref 0.1–0.9)
Monocytes: 6 %
Neutrophils Absolute: 4.4 10*3/uL (ref 1.4–7.0)
Neutrophils: 60 %
Platelets: 227 10*3/uL (ref 150–450)
RBC: 4.62 x10E6/uL (ref 3.77–5.28)
RDW: 12.6 % (ref 11.7–15.4)
WBC: 7.1 10*3/uL (ref 3.4–10.8)

## 2020-06-27 LAB — COMPREHENSIVE METABOLIC PANEL
ALT: 29 IU/L (ref 0–32)
AST: 17 IU/L (ref 0–40)
Albumin/Globulin Ratio: 2 (ref 1.2–2.2)
Albumin: 4.3 g/dL (ref 3.8–4.8)
Alkaline Phosphatase: 73 IU/L (ref 44–121)
BUN/Creatinine Ratio: 14 (ref 9–23)
BUN: 10 mg/dL (ref 6–24)
Bilirubin Total: 0.5 mg/dL (ref 0.0–1.2)
CO2: 26 mmol/L (ref 20–29)
Calcium: 9.2 mg/dL (ref 8.7–10.2)
Chloride: 99 mmol/L (ref 96–106)
Creatinine, Ser: 0.73 mg/dL (ref 0.57–1.00)
Globulin, Total: 2.2 g/dL (ref 1.5–4.5)
Glucose: 96 mg/dL (ref 65–99)
Potassium: 4 mmol/L (ref 3.5–5.2)
Sodium: 141 mmol/L (ref 134–144)
Total Protein: 6.5 g/dL (ref 6.0–8.5)
eGFR: 101 mL/min/{1.73_m2} (ref >59)

## 2020-06-27 NOTE — Progress Notes (Signed)
   Subjective:    Patient ID: Katherine Lewis, female    DOB: 1970/08/08, 50 y.o.   MRN: [id number]  HPI She is here for consult concerning diarrhea.  She was seen yesterday virtually and I decided to have her come in today for for more blood work and further evaluation.  She was diagnosed with COVID on May 10 and had difficulty through the 29th.  It was after COVID that she developed diarrhea having 4 or more stools per day.  No fever, chills, blood or pus in her stool, foul-smelling stool.  She does have a previous history of being told that she did have IBS but was never formally diagnosed.   Review of Systems     Objective:   Physical Exam Alert and in no distress. Tympanic membranes and canals are normal. Pharyngeal area is normal. Neck is supple without adenopathy or thyromegaly. Cardiac exam shows a regular sinus rhythm without murmurs or gallops. Lungs are clear to auscultation. Abdominal exam shows active bowel sounds without masses or tenderness.       Assessment & Plan:   History of COVID-19 - Plan: Cdiff NAA+O+P+Stool Culture, Cdiff NAA+O+P+Stool Culture, CBC with Differential/Platelet, Comprehensive metabolic panel  Diarrhea, unspecified type - Plan: Cdiff NAA+O+P+Stool Culture, Cdiff NAA+O+P+Stool Culture, CBC with Differential/Platelet, Comprehensive metabolic panel Recommend she double the dose of her Imodium until we get a better idea on what is causing the diarrhea.

## 2020-06-30 ENCOUNTER — Other Ambulatory Visit: Payer: Self-pay

## 2020-06-30 ENCOUNTER — Other Ambulatory Visit: Payer: PRIVATE HEALTH INSURANCE

## 2020-06-30 ENCOUNTER — Other Ambulatory Visit: Payer: Self-pay | Admitting: Family Medicine

## 2020-07-05 ENCOUNTER — Encounter: Payer: Self-pay | Admitting: Family Medicine

## 2020-07-07 LAB — CDIFF NAA+O+P+STOOL CULTURE
E coli, Shiga toxin Assay: NEGATIVE
E coli, Shiga toxin Assay: NEGATIVE
Toxigenic C. Difficile by PCR: NEGATIVE
Toxigenic C. Difficile by PCR: NEGATIVE

## 2020-07-10 ENCOUNTER — Telehealth: Payer: Self-pay

## 2020-07-10 ENCOUNTER — Other Ambulatory Visit: Payer: Self-pay | Admitting: Family Medicine

## 2020-07-10 MED ORDER — VIBERZI 100 MG PO TABS: 100.0000 mg | ORAL_TABLET | Freq: Two times a day (BID) | ORAL | 1 refills | Status: DC

## 2020-07-10 NOTE — Telephone Encounter (Signed)
Pharmacy called to advise that her viberzi can only be filled qty 74. Please advise if this is ok. [place]

## 2020-07-10 NOTE — Progress Notes (Signed)
She continues have difficulty with diarrhea post COVID.  Has been going on for several weeks.  I will try her on a 1 week course of Viberzi to see if that will help slow her down.

## 2020-07-25 ENCOUNTER — Other Ambulatory Visit: Payer: Self-pay

## 2020-07-25 MED ORDER — VIBERZI 100 MG PO TABS: 100.0000 mg | ORAL_TABLET | Freq: Two times a day (BID) | ORAL | 0 refills | Status: DC

## 2020-07-25 NOTE — Telephone Encounter (Signed)
Katherine Lewis I can not sign this as it will not send to pharmacy. Please send in

## 2020-07-25 NOTE — Telephone Encounter (Signed)
[facility] never received the quantity change on Viberzi to #60, advised ok per last telephone call.  They need new Rx sent in, can't take verbal.

## 2020-07-30 ENCOUNTER — Telehealth: Payer: Self-pay | Admitting: Internal Medicine

## 2020-07-30 NOTE — Telephone Encounter (Signed)
P.A for Viberzi 120m twice a day. Waiting for reply through covermymeds

## 2020-07-30 NOTE — Telephone Encounter (Signed)
APPROVED through 07/30/2021. Left message for pt to go by pharmacy to pick medication up. Will fax approval

## 2020-08-05 ENCOUNTER — Encounter: Payer: PRIVATE HEALTH INSURANCE | Admitting: Family Medicine

## 2020-08-07 ENCOUNTER — Other Ambulatory Visit: Payer: Self-pay | Admitting: Family Medicine

## 2020-08-07 DIAGNOSIS — I10 Essential (primary) hypertension: Secondary | ICD-10-CM

## 2020-10-01 ENCOUNTER — Other Ambulatory Visit: Payer: Self-pay | Admitting: Family Medicine

## 2020-10-01 DIAGNOSIS — F341 Dysthymic disorder: Secondary | ICD-10-CM

## 2020-10-01 NOTE — Telephone Encounter (Signed)
Are these okay to refill?

## 2020-10-02 ENCOUNTER — Ambulatory Visit (INDEPENDENT_AMBULATORY_CARE_PROVIDER_SITE_OTHER): Payer: No Typology Code available for payment source | Admitting: Family Medicine

## 2020-10-02 ENCOUNTER — Other Ambulatory Visit: Payer: Self-pay

## 2020-10-02 VITALS — BP 120/80 | HR 120 | Temp 96.8°F | Wt 258.4 lb

## 2020-10-02 DIAGNOSIS — R000 Tachycardia, unspecified: Secondary | ICD-10-CM | POA: Diagnosis not present

## 2020-10-02 NOTE — Progress Notes (Signed)
   Subjective:    Patient ID: Katherine Lewis, female    DOB: 04-Jan-1971, 50 y.o.   MRN: [id number]  HPI She is here for evaluation of a fluttering sensation.  She states that she first noticed this several weeks ago but it actually woke her up.  She also has some dizziness associated with this.  She also had some weakness with this.  She notes that it also made her cough.  She was not able to check her heart rate.  She is not on decongestants.  Apparently her mother has CHF and grandparents had heart disease.   Review of Systems     Objective:   Physical Exam Alert and in no distress.  Pulse ox was normal however the pulse ox pulse rate initially was 126.  Tympanic membranes and canals are normal. Pharyngeal area is normal. Neck is supple without adenopathy or thyromegaly. Cardiac exam shows a sinus tachycardia without murmurs or gallops. Lungs are clear to auscultation. EKG read by me does show tachycardia otherwise the other parameters are all normal.  Of note is the fact that she has a trip planned to [place] in about 10 days.     Assessment & Plan:  Tachycardia - Plan: CBC with Differential/Platelet, Comprehensive metabolic panel, Lipid panel, TSH, Cardiac event monitor, Ambulatory referral to Cardiology, Cardiac event monitor, CANCELED: Ambulatory referral to Cardiology Discussed the tachycardia with her.  Explained that at this point it does not appear to be atrial fibrillation but I think doing an event monitor would be appropriate and referral to cardiology to get their input into proper medication for this. I attempted to show her how to check her pulse rate although this was difficult to do due to her weight.  She will also get a smart watch and wear it did help further determine the cardiac events.

## 2020-10-03 ENCOUNTER — Encounter: Payer: Self-pay | Admitting: Family Medicine

## 2020-10-03 DIAGNOSIS — G4733 Obstructive sleep apnea (adult) (pediatric): Secondary | ICD-10-CM

## 2020-10-03 LAB — COMPREHENSIVE METABOLIC PANEL
ALT: 19 IU/L (ref 0–32)
AST: 17 IU/L (ref 0–40)
Albumin/Globulin Ratio: 1.8 (ref 1.2–2.2)
Albumin: 4.3 g/dL (ref 3.8–4.8)
Alkaline Phosphatase: 80 IU/L (ref 44–121)
BUN/Creatinine Ratio: 19 (ref 9–23)
BUN: 14 mg/dL (ref 6–24)
Bilirubin Total: 0.4 mg/dL (ref 0.0–1.2)
CO2: 24 mmol/L (ref 20–29)
Calcium: 9.6 mg/dL (ref 8.7–10.2)
Chloride: 100 mmol/L (ref 96–106)
Creatinine, Ser: 0.74 mg/dL (ref 0.57–1.00)
Globulin, Total: 2.4 g/dL (ref 1.5–4.5)
Glucose: 114 mg/dL — ABNORMAL HIGH (ref 65–99)
Potassium: 4.3 mmol/L (ref 3.5–5.2)
Sodium: 141 mmol/L (ref 134–144)
Total Protein: 6.7 g/dL (ref 6.0–8.5)
eGFR: 99 mL/min/{1.73_m2} (ref >59)

## 2020-10-03 LAB — CBC WITH DIFFERENTIAL/PLATELET
Basophils Absolute: 0 10*3/uL (ref 0.0–0.2)
Basos: 0 %
EOS (ABSOLUTE): 0.2 10*3/uL (ref 0.0–0.4)
Eos: 2 %
Hematocrit: 44.1 % (ref 34.0–46.6)
Hemoglobin: 15.2 g/dL (ref 11.1–15.9)
Immature Grans (Abs): 0 10*3/uL (ref 0.0–0.1)
Immature Granulocytes: 0 %
Lymphocytes Absolute: 2.4 10*3/uL (ref 0.7–3.1)
Lymphs: 25 %
MCH: 31.9 pg (ref 26.6–33.0)
MCHC: 34.5 g/dL (ref 31.5–35.7)
MCV: 93 fL (ref 79–97)
Monocytes Absolute: 0.6 10*3/uL (ref 0.1–0.9)
Monocytes: 6 %
Neutrophils Absolute: 6.6 10*3/uL (ref 1.4–7.0)
Neutrophils: 67 %
Platelets: 241 10*3/uL (ref 150–450)
RBC: 4.77 x10E6/uL (ref 3.77–5.28)
RDW: 13.2 % (ref 11.7–15.4)
WBC: 9.8 10*3/uL (ref 3.4–10.8)

## 2020-10-03 LAB — LIPID PANEL
Chol/HDL Ratio: 5 ratio — ABNORMAL HIGH (ref 0.0–4.4)
Cholesterol, Total: 222 mg/dL — ABNORMAL HIGH (ref 100–199)
HDL: 44 mg/dL (ref >39)
LDL Chol Calc (NIH): 139 mg/dL — ABNORMAL HIGH (ref 0–99)
Triglycerides: 219 mg/dL — ABNORMAL HIGH (ref 0–149)
VLDL Cholesterol Cal: 39 mg/dL (ref 5–40)

## 2020-10-03 LAB — TSH: TSH: 1.99 u[IU]/mL (ref 0.450–4.500)

## 2020-10-07 ENCOUNTER — Encounter: Payer: Self-pay | Admitting: Family Medicine

## 2020-10-19 ENCOUNTER — Encounter: Payer: Self-pay | Admitting: Family Medicine

## 2020-10-20 ENCOUNTER — Other Ambulatory Visit: Payer: Self-pay

## 2020-10-20 ENCOUNTER — Telehealth: Payer: No Typology Code available for payment source | Admitting: Family Medicine

## 2020-10-21 ENCOUNTER — Encounter: Payer: Self-pay | Admitting: Family Medicine

## 2020-10-21 ENCOUNTER — Telehealth (INDEPENDENT_AMBULATORY_CARE_PROVIDER_SITE_OTHER): Payer: No Typology Code available for payment source | Admitting: Family Medicine

## 2020-10-21 VITALS — HR 155 | Wt 258.6 lb

## 2020-10-21 DIAGNOSIS — I480 Paroxysmal atrial fibrillation: Secondary | ICD-10-CM

## 2020-10-21 NOTE — Progress Notes (Signed)
   Subjective:    Patient ID: Katherine Lewis, female    DOB: 27-May-1970, 50 y.o.   MRN: [id number]  HPI Documentation for virtual audio and video telecommunications through [place] encounter:  The patient was located at home. 2 patient identifiers used.  The provider was located in the office. The patient did consent to this visit and is aware of possible charges through their insurance for this visit.  The other persons participating in this telemedicine service were none. Time spent on call was 3 minutes and in review of previous records >10 minutes total for counseling and coordination of care.  This virtual service is not related to other E/M service within previous 7 days.  She was seen on September 15 for evaluation of tachycardia.  At that point the EKG showed a tachycardia and she was set up for cardiology evaluation as well as of the event monitor.  She has yet to get the event monitor but she did get a device called Kardia.  She woke up several days ago with rapid heart rate and was able to get a monitored strip of it.  She sent it to me in a MyChart visit and it does appear that she is in atrial fib.  Review of Systems     Objective:   Physical Exam Alert and in no distress.  The monitor was reviewed.       Assessment & Plan:  Paroxysmal atrial fibrillation [facility]) She is set up to be seen November 4.  I will send a note to Dr. Ali Lowe concerning this and the possibility of getting in sooner for more definitive care.

## 2020-10-22 ENCOUNTER — Telehealth: Payer: Self-pay

## 2020-10-22 NOTE — Telephone Encounter (Signed)
Patient is scheduled to see Dr. Ali Lowe on 11/4. The patient's PCP called Dr. Ali Lowe and requested the patient be seen sooner. Per Dr. Ali Lowe, the patient may be moved to this Friday 10/7.   Attempted to contact patient, but there was no answer. Left message for patient to call back.

## 2020-10-23 NOTE — Telephone Encounter (Signed)
Left message for patient to call back.  Sent her a mychart message to see if she is available tomorrow for the 1:20 pm appointment.

## 2020-10-24 ENCOUNTER — Ambulatory Visit: Payer: No Typology Code available for payment source | Admitting: Internal Medicine

## 2020-10-24 ENCOUNTER — Encounter: Payer: Self-pay | Admitting: Internal Medicine

## 2020-10-24 ENCOUNTER — Other Ambulatory Visit: Payer: Self-pay

## 2020-10-24 ENCOUNTER — Ambulatory Visit (INDEPENDENT_AMBULATORY_CARE_PROVIDER_SITE_OTHER): Payer: No Typology Code available for payment source

## 2020-10-24 VITALS — BP 120/78 | HR 83 | Ht 65.0 in | Wt 257.0 lb

## 2020-10-24 DIAGNOSIS — R0602 Shortness of breath: Secondary | ICD-10-CM | POA: Diagnosis not present

## 2020-10-24 DIAGNOSIS — I480 Paroxysmal atrial fibrillation: Secondary | ICD-10-CM | POA: Insufficient documentation

## 2020-10-24 DIAGNOSIS — R002 Palpitations: Secondary | ICD-10-CM

## 2020-10-24 DIAGNOSIS — I4891 Unspecified atrial fibrillation: Secondary | ICD-10-CM

## 2020-10-24 DIAGNOSIS — G4730 Sleep apnea, unspecified: Secondary | ICD-10-CM | POA: Diagnosis not present

## 2020-10-24 DIAGNOSIS — I10 Essential (primary) hypertension: Secondary | ICD-10-CM

## 2020-10-24 MED ORDER — METOPROLOL TARTRATE 25 MG PO TABS: 25.0000 mg | ORAL_TABLET | ORAL | 0 refills | Status: DC

## 2020-10-24 NOTE — Progress Notes (Unsigned)
Enrolled patient for a 14 day Zio XT Monitor to be mailed to patients home

## 2020-10-24 NOTE — Progress Notes (Signed)
Cardiology Office Note:    Date:  10/24/2020   ID:  Katherine Lewis, DOB [birth year], MRN [id number]  PCP:  Denita Lung, MD   [facility] Prov[facility] Cardiologist:  Lenna Sciara, MD    Referring MD: Denita Lung, MD   CC:  Palpitations  History of Present Illness:    PROBLEM LIST: 1.  Mild obstructive sleep apnea 2.  Hyperlipidemia 3.  Hypertension 4.  Palpitations; Kardia monitor demonstrated 30 seconds of atrial fibrillation  Katherine Lewis is a 50 y.o. female with the above listed medical history who was referred by Dr. Redmond School for recommendations regarding atrial fibrillation picked up on mobile phone monitor.  The patient had been seen by her PCP [place].  She had an episode of palpitations associate with nausea.  [place] she woke up with palpitations 1 night and then transmitted recording which demonstrated 30 seconds worth of atrial fibrillation.  On return she was referred for an event monitor.  She also had laboratories which were relatively unremarkable.  In terms of other symptoms she does report exertional dyspnea after walking about a block.  She denies any chest discomfort.  She does get occasionally lightheaded when standing for a while at work.  This does not occur when she goes from sitting to standing.  She denies any orthopnea, paroxysmal nocturnal dyspnea, peripheral edema, signs symptoms stroke, or severe bleeding.  She is scheduled for a more comprehensive obstructive sleep apnea evaluation.   Past Surgical History:  Procedure Laterality Date   LAPAROSCOPIC GASTRIC SLEEVE RESECTION N/A 04/23/2014   Procedure: LAPAROSCOPIC GASTRIC SLEEVE RESECTION WITH UPPER ENDOSCOPY;  Surgeon: Johnathan Hausen, MD;  Location: [facility];  Service: General;  Laterality: N/A;   MYRINGOTOMY     bilateral    TOTAL KNEE ARTHROPLASTY Right 01/02/2019   Procedure: RIGHT TOTAL KNEE ARTHROPLASTY;  Surgeon: Meredith Pel, MD;  Location: [facility];  Service:  Orthopedics;  Laterality: Right;    Current Medications: Current Meds  Medication Sig   escitalopram (LEXAPRO) 5 MG tablet TAKE 1 TABLET ONCE DAILY.   levonorgestrel (MIRENA) 20 MCG/24HR IUD 1 each by Intrauterine route once. Reported on 06/23/2015   losartan-hydrochlorothiazide (HYZAAR) 50-12.5 MG tablet TAKE 1/2 TABLET ONCE DAILY.   pantoprazole (PROTONIX) 40 MG tablet TAKE ONE TABLET ONCE DAILY     Allergies:   Codeine   Social History   Socioeconomic History   Marital status: Married    Spouse name: Not on file   Number of children: Not on file   Years of education: Not on file   Highest education level: Not on file  Occupational History   Occupation: Tourist information centre manager    Employer: [employer]  Tobacco Use   Smoking status: Former    Packs/day: 0.50    Years: 6.00    Pack years: 3.00    Types: Cigarettes    Quit date: 01/19/1992    Years since quitting: 28.7   Smokeless tobacco: Never   Tobacco comments:    social   Vaping Use   Vaping Use: Never used  Substance and Sexual Activity   Alcohol use: Yes    Comment: social    Drug use: No   Sexual activity: Yes    Birth control/protection: I.U.D.    Comment: Mirena IUD  Other Topics Concern   Not on file  Social History Narrative   Not on file   Social Determinants of Health   Financial Resource Strain: Not on  file  Food Insecurity: Not on file  Transportation Needs: Not on file  Physical Activity: Not on file  Stress: Not on file  Social Connections: Not on file     Family History: The patient's family history includes Diabetes in her mother; Diabetes type II in her mother; Lung cancer in her paternal grandfather and paternal grandmother.  ROS:   Please see the history of present illness.     All other systems reviewed and are negative.  EKGs/Labs/Other Studies Reviewed:    The following studies were reviewed today: Kardia monitor tracing  EKG:  EKG is ordered today.  The ekg ordered today  demonstrates normal sinus rhythm.  Recent Labs: 10/02/2020: ALT 19; BUN 14; Creatinine, Ser 0.74; Hemoglobin 15.2; Platelets 241; Potassium 4.3; Sodium 141; TSH 1.990  Recent Lipid Panel    Component Value Date/Time   CHOL 222 (H) 10/02/2020 1004   TRIG 219 (H) 10/02/2020 1004   HDL 44 10/02/2020 1004   CHOLHDL 5.0 (H) 10/02/2020 1004   LDLCALC 139 (H) 10/02/2020 1004     Risk Assessment/Calculations:    CHA2DS2-VASc Score = 2    This indicates a 2.2% annual risk of stroke. The patient's score is based upon: CHF History: 0 HTN History: 1 Diabetes History: 0 Stroke History: 0 Vascular Disease History: 0 Age Score: 0 Gender Score: 1           Physical Exam:    VS:  BP 120/78   Pulse 83   Ht 5' 5"  (1.651 m)   Wt 257 lb (116.6 kg)   SpO2 95%   BMI 42.77 kg/m     Wt Readings from Last 3 Encounters:  10/24/20 257 lb (116.6 kg)  10/21/20 258 lb 9.6 oz (117.3 kg)  10/02/20 258 lb 6.4 oz (117.2 kg)     GEN:  Well developed in no acute distress HEENT: Normal NECK: No JVD; No carotid bruits LYMPHATICS: No lymphadenopathy CARDIAC: RRR, no murmurs, rubs, gallops RESPIRATORY:  Clear to auscultation without rales, wheezing or rhonchi  ABDOMEN: Soft, non-tender, non-distended MUSCULOSKELETAL:  No edema; No deformity  SKIN: Warm and dry NEUROLOGIC:  Alert and oriented x 3 PSYCHIATRIC:  Normal affect   ASSESSMENT:    1. Essential hypertension   2. Mild sleep apnea   3. Atrial fibrillation, unspecified type ([place])   4. Shortness of breath    PLAN:    1. Essential hypertension   Her blood pressure is well controlled on her current regimen.   2. Mild sleep apnea   She is getting a more comprehensive sleep apnea evaluation.  Recent randomized clinical control a trial demonstrated that treatment of sleep apnea did not curb atrial fibrillation burden.  However she should be administered CPAP if required for other health benefits.   3. Atrial fibrillation,  unspecified type [facility])   She had a very short episode of atrial fibrillation.  We will refer her for a 14-day Zio patch for more conclusive assessment.  She did not receive her event monitor in the mail.  If this demonstrates no or minimal burden given her low chads vascular 2 score no further evaluation is required.  I will obtain an echocardiogram for further evaluation as well.   4. Shortness of breath   This may be due to her body habitus or could be an anginal equivalent.  For this reason I will obtain a CT FFR study for further evaluation.  I will see her back in 3 months or earlier if  needed.         Medication Adjustments/Labs and Tests Ordered: Current medicines are reviewed at length with the patient today.  Concerns regarding medicines are outlined above.  No orders of the defined types were placed in this encounter.  No orders of the defined types were placed in this encounter.   Patient Instructions  Medication Instructions:  Your physician recommends that you continue on your current medications as directed. Please refer to the Current Medication list given to you today.  *If you need a refill on your cardiac medications before your next appointment, please call your pharmacy*   Lab Work: None ordered   If you have labs (blood work) drawn today and your tests are completely normal, you will receive your results only by: [place] (if you have MyChart) OR A paper copy in the mail If you have any lab test that is abnormal or we need to change your treatment, we will call you to review the results.   Testing/Procedures: Your physician has requested that you have an echocardiogram. Echocardiography is a painless test that uses sound waves to create images of your heart. It provides your doctor with information about the size and shape of your heart and how well your heart's chambers and valves are working. This procedure takes approximately one hour. There are no  restrictions for this procedure.  Your physician has requested that you have a cardiac ct    Follow-Up: At [facility], you and your health needs are our priority.  As part of our continuing mission to provide you with exceptional heart care, we have created designated Provider Care Teams.  These Care Teams include your primary Cardiologist (physician) and Advanced Practice Providers (APPs -  Physician Assistants and Nurse Practitioners) who all work together to provide you with the care you need, when you need it.  We recommend signing up for the patient portal called "MyChart".  Sign up information is provided on this After Visit Summary.  MyChart is used to connect with patients for Virtual Visits (Telemedicine).  Patients are able to view lab/test results, encounter notes, upcoming appointments, etc.  Non-urgent messages can be sent to your provider as well.   To learn more about what you can do with MyChart, go to [email or web address].    Your next appointment:   3 month(s)  The format for your next appointment:   In Person  Provider:   Dr. Lenna Sciara   Other Instructions   Your cardiac CT will be scheduled at one of the below locations:   [facility] [address] [place], [place] [address] [phone]   If scheduled at [facility], please arrive at the [facility] main entrance (entrance A) of [facility] 30 minutes prior to test start time. Proceed to the [facility] Radiology Department (first floor) to check-in and test prep.   On the Night Before the Test: Be sure to Drink plenty of water. Do not consume any caffeinated/decaffeinated beverages or chocolate 12 hours prior to your test. Do not take any antihistamines 12 hours prior to your test.   On the Day of the Test: Drink plenty of water until 1 hour prior to the test. Do not eat any food 4 hours prior to the test. You may take your regular medications prior to the  test.  Take metoprolol (Lopressor) two hours prior to test. HOLD losartan-Hctz FEMALES- please wear underwire-free bra if available, avoid dresses & tight clothing  After the Test: Drink plenty of water. After receiving IV contrast, you may experience a mild flushed feeling. This is normal. On occasion, you may experience a mild rash up to 24 hours after the test. This is not dangerous. If this occurs, you can take Benadryl 25 mg and increase your fluid intake. If you experience trouble breathing, this can be serious. If it is severe call 911 IMMEDIATELY. If it is mild, please call our office.  Please allow 2-4 weeks for scheduling of routine cardiac CTs. Some insurance companies require a pre-authorization which may delay scheduling of this test.   For non-scheduling related questions, please contact the cardiac imaging nurse navigator should you have any questions/concerns: Marchia Bond, Cardiac Imaging Nurse Navigator Gordy Clement, Cardiac Imaging Nurse Navigator [facility] Direct Office Dial: [phone]   For scheduling needs, including cancellations and rescheduling, please call [place], [phone].    Signed, Early Osmond, MD  10/24/2020 4:16 PM    [facility]

## 2020-10-24 NOTE — Progress Notes (Deleted)
Cardiology Office Note:    Date:  10/24/2020   ID:  Katherine Lewis, DOB 07-06-1970, MRN [id number]  PCP:  Denita Lung, MD   [facility] Providers Cardiologist:  None { Click to update primary MD,subspecialty MD or APP then REFRESH:1}    Referring MD: Denita Lung, MD   CC:  Palpitations  History of Present Illness:    PROBLEM LIST: 1.  Mild obstructive sleep apnea not on CPAP 2.  Hypertension 3.  Hypothyroidism 4.  Kardia recording demonstrated limited atrial fibrillation lasting 30 seconds (10/2)  Katherine Lewis is a 50 y.o. female with the above listed medical history who was referred by Dr. Redmond School for recommendations regarding the monitor demonstrating 30 seconds of potential atrial fibrillation.     Past Surgical History:  Procedure Laterality Date   LAPAROSCOPIC GASTRIC SLEEVE RESECTION N/A 04/23/2014   Procedure: LAPAROSCOPIC GASTRIC SLEEVE RESECTION WITH UPPER ENDOSCOPY;  Surgeon: Johnathan Hausen, MD;  [facility];  Service: General;  Laterality: N/A;   MYRINGOTOMY     bilateral    TOTAL KNEE ARTHROPLASTY Right 01/02/2019   Procedure: RIGHT TOTAL KNEE ARTHROPLASTY;  Surgeon: Meredith Pel, MD;  Location: [place];  Service: Orthopedics;  Laterality: Right;    Current Medications: No outpatient medications have been marked as taking for the 10/24/20 encounter (Appointment) with Early Osmond, MD.     Allergies:   Codeine   Social History   Socioeconomic History   Marital status: Married    Spouse name: Not on file   Number of children: Not on file   Years of education: Not on file   Highest education level: Not on file  Occupational History   Occupation: Educator    Employer: [employer]  Tobacco Use   Smoking status: Former    Packs/day: 0.50    Years: 6.00    Pack years: 3.00    Types: Cigarettes    Quit date: 01/19/1992    Years since quitting: 28.7   Smokeless tobacco: Never   Tobacco comments:    social   Vaping Use    Vaping Use: Never used  Substance and Sexual Activity   Alcohol use: Yes    Comment: social    Drug use: No   Sexual activity: Yes    Birth control/protection: I.U.D.    Comment: Mirena IUD  Other Topics Concern   Not on file  Social History Narrative   Not on file   Social Determinants of Health   Financial Resource Strain: Not on file  Food Insecurity: Not on file  Transportation Needs: Not on file  Physical Activity: Not on file  Stress: Not on file  Social Connections: Not on file     Family History: The patient's family history includes Diabetes in her mother; Diabetes type II in her mother; Lung cancer in her paternal grandfather and paternal grandmother.  ROS:   Please see the history of present illness.    All other systems reviewed and are negative.  EKGs/Labs/Other Studies Reviewed:    The following studies were reviewed today: ***  EKG:  EKG is *** ordered today.  The ekg ordered today demonstrates ***  Recent Labs: 10/02/2020: ALT 19; BUN 14; Creatinine, Ser 0.74; Hemoglobin 15.2; Platelets 241; Potassium 4.3; Sodium 141; TSH 1.990  Recent Lipid Panel    Component Value Date/Time   CHOL 222 (H) 10/02/2020 1004   TRIG 219 (H) 10/02/2020 1004   HDL 44 10/02/2020 1004   CHOLHDL  5.0 (H) 10/02/2020 1004   LDLCALC 139 (H) 10/02/2020 1004     Risk Assessment/Calculations:    CHADS2-VASc   CHF History No  HTN History Yes  Diabetes History No  Stroke History No  Vascular Disease History No  Calculated Age 61 21.89  Calculated Age [age]  Calculated CHADS Age 47  Gender Calculation 1  Age Score 0  Gender Score 1  CHADS2-VASc Score 2  Annual Stroke Risk % 2.2           Physical Exam:    VS:  There were no vitals taken for this visit.    Wt Readings from Last 3 Encounters:  10/21/20 258 lb 9.6 oz (117.3 kg)  10/02/20 258 lb 6.4 oz (117.2 kg)  06/27/20 255 lb 12.8 oz (116 kg)     GEN: *** Well nourished, well developed in no acute  distress HEENT: Normal NECK: No JVD; No carotid bruits LYMPHATICS: No lymphadenopathy CARDIAC: ***RRR, no murmurs, rubs, gallops RESPIRATORY:  Clear to auscultation without rales, wheezing or rhonchi  ABDOMEN: Soft, non-tender, non-distended MUSCULOSKELETAL:  No edema; No deformity  SKIN: Warm and dry NEUROLOGIC:  Alert and oriented x 3 PSYCHIATRIC:  Normal affect   ASSESSMENT:    No diagnosis found. PLAN:    In order of problems listed above:  ***   {Are you ordering a CV Procedure (e.g. stress test, cath, DCCV, TEE, etc)?   Press F2        :[id number]}    Medication Adjustments/Labs and Tests Ordered: Current medicines are reviewed at length with the patient today.  Concerns regarding medicines are outlined above.  No orders of the defined types were placed in this encounter.  No orders of the defined types were placed in this encounter.   There are no Patient Instructions on file for this visit.   Signed, Early Osmond, MD  10/24/2020 2:03 PM    [place]

## 2020-10-24 NOTE — Patient Instructions (Addendum)
Medication Instructions:  Your physician recommends that you continue on your current medications as directed. Please refer to the Current Medication list given to you today.  *If you need a refill on your cardiac medications before your next appointment, please call your pharmacy*   Lab Work: Bmp- Today   If you have labs (blood work) drawn today and your tests are completely normal, you will receive your results only by: [place] (if you have MyChart) OR A paper copy in the mail If you have any lab test that is abnormal or we need to change your treatment, we will call you to review the results.   Testing/Procedures: Your physician has requested that you have an echocardiogram. Echocardiography is a painless test that uses sound waves to create images of your heart. It provides your doctor with information about the size and shape of your heart and how well your heart's chambers and valves are working. This procedure takes approximately one hour. There are no restrictions for this procedure.  Your physician has requested that you have a cardiac ct   A zio monitor was ordered today. It will remain on for 14 days. You will then return monitor and event diary in provided box. It takes 1-2 weeks for report to be downloaded and returned to [place]. We will call you with the results. If monitor falls off or has orange flashing light, please call Zio for further instructions.   Follow-Up: At [facility], you and your health needs are our priority.  As part of our continuing mission to provide you with exceptional heart care, we have created designated Provider Care Teams.  These Care Teams include your primary Cardiologist (physician) and Advanced Practice Providers (APPs -  Physician Assistants and Nurse Practitioners) who all work together to provide you with the care you need, when you need it.  We recommend signing up for the patient portal called "MyChart".  Sign up information is  provided on this After Visit Summary.  MyChart is used to connect with patients for Virtual Visits (Telemedicine).  Patients are able to view lab/test results, encounter notes, upcoming appointments, etc.  Non-urgent messages can be sent to your provider as well.   To learn more about what you can do with MyChart, go to [email or web address].    Your next appointment:   3 month(s)  The format for your next appointment:   In Person  Provider:   Dr. Lenna Sciara   Other Instructions   Your cardiac CT will be scheduled at one of the below locations:   [facility] [address] [place], [place] [address] [phone]   If scheduled at [facility], please arrive at the [facility] main entrance (entrance A) of [facility] 30 minutes prior to test start time. Proceed to the [facility] Radiology Department (first floor) to check-in and test prep.   On the Night Before the Test: Be sure to Drink plenty of water. Do not consume any caffeinated/decaffeinated beverages or chocolate 12 hours prior to your test. Do not take any antihistamines 12 hours prior to your test.   On the Day of the Test: Drink plenty of water until 1 hour prior to the test. Do not eat any food 4 hours prior to the test. You may take your regular medications prior to the test.  Take metoprolol (Lopressor) two hours prior to test. HOLD losartan-Hctz FEMALES- please wear underwire-free bra if available, avoid dresses & tight clothing  After the Test: Drink plenty of water. After receiving IV contrast, you may experience a mild flushed feeling. This is normal. On occasion, you may experience a mild rash up to 24 hours after the test. This is not dangerous. If this occurs, you can take Benadryl 25 mg and increase your fluid intake. If you experience trouble breathing, this can be serious. If it is severe call 911 IMMEDIATELY. If it is mild, please call our office.  Please  allow 2-4 weeks for scheduling of routine cardiac CTs. Some insurance companies require a pre-authorization which may delay scheduling of this test.   For non-scheduling related questions, please contact the cardiac imaging nurse navigator should you have any questions/concerns: Marchia Bond, Cardiac Imaging Nurse Navigator Gordy Clement, Cardiac Imaging Nurse Navigator [place] [facility] Direct Office Dial: [phone]   For scheduling needs, including cancellations and rescheduling, please call [place], [phone].  ZIO XT- Long Term Monitor Instructions  Your physician has requested you wear a ZIO patch monitor for 14 days.  This is a single patch monitor. [employer] supplies one patch monitor per enrollment. Additional stickers are not available. Please do not apply patch if you will be having a Nuclear Stress Test,  Echocardiogram, Cardiac CT, MRI, or Chest Xray during the period you would be wearing the  monitor. The patch cannot be worn during these tests. You cannot remove and re-apply the  ZIO XT patch monitor.  Your ZIO patch monitor will be mailed 3 day USPS to your address on file. It may take 3-5 days  to receive your monitor after you have been enrolled.  Once you have received your monitor, please review the enclosed instructions. Your monitor  has already been registered assigning a specific monitor serial # to you.  Billing and Patient Assistance Program Information  We have supplied [employer] with any of your insurance information on file for billing purposes. [employer] offers a sliding scale Patient Assistance Program for patients that do not have  insurance, or whose insurance does not completely cover the cost of the ZIO monitor.  You must apply for the Patient Assistance Program to qualify for this discounted rate.  To apply, please call [employer] at [phone], select option 4, select option 2, ask to apply for  Patient Assistance Program.  Theodore Demark will ask your household income, and how many people  are in your household. They will quote your out-of-pocket cost based on that information.  [employer] will also be able to set up a 56-month interest-free payment plan if needed.  Applying the monitor   Shave hair from upper left chest.  Hold abrader disc by orange tab. Rub abrader in 40 strokes over the upper left chest as  indicated in your monitor instructions.  Clean area with 4 enclosed alcohol pads. Let dry.  Apply patch as indicated in monitor instructions. Patch will be placed under collarbone on left  side of chest with arrow pointing upward.  Rub patch adhesive wings for 2 minutes. Remove white label marked "1". Remove the white  label marked "2". Rub patch adhesive wings for 2 additional minutes.  While looking in a mirror, press and release button in center of patch. A small green light will  flash 3-4 times. This will be your only indicator that the monitor has been turned on.  Do not shower for the first 24 hours. You may shower after the first 24 hours.  Press the button if you feel a symptom. You will hear a  small click. Record Date, Time and  Symptom in the Patient Logbook.  When you are ready to remove the patch, follow instructions on the last 2 pages of Patient  Logbook. Stick patch monitor onto the last page of Patient Logbook.  Place Patient Logbook in the blue and white box. Use locking tab on box and tape box closed  securely. The blue and white box has prepaid postage on it. Please place it in the mailbox as  soon as possible. Your physician should have your test results approximately 7 days after the  monitor has been mailed back to [facility].  Call [place] at [phone] if you have questions regarding  your ZIO XT patch monitor. Call them immediately if you see an orange light blinking on your  monitor.  If your monitor falls off in less than 4 days, contact our Monitor  department at [phone].  If your monitor becomes loose or falls off after 4 days call [employer] at [phone] for  suggestions on securing your monitor

## 2020-10-25 LAB — BASIC METABOLIC PANEL
BUN/Creatinine Ratio: 15 (ref 9–23)
BUN: 12 mg/dL (ref 6–24)
CO2: 23 mmol/L (ref 20–29)
Calcium: 8.8 mg/dL (ref 8.7–10.2)
Chloride: 103 mmol/L (ref 96–106)
Creatinine, Ser: 0.8 mg/dL (ref 0.57–1.00)
Glucose: 97 mg/dL (ref 70–99)
Potassium: 3.8 mmol/L (ref 3.5–5.2)
Sodium: 139 mmol/L (ref 134–144)
eGFR: 90 mL/min/{1.73_m2} (ref >59)

## 2020-10-30 ENCOUNTER — Telehealth (HOSPITAL_COMMUNITY): Payer: Self-pay | Admitting: *Deleted

## 2020-10-30 ENCOUNTER — Other Ambulatory Visit (HOSPITAL_COMMUNITY): Payer: Self-pay | Admitting: *Deleted

## 2020-10-30 ENCOUNTER — Encounter (HOSPITAL_COMMUNITY): Payer: Self-pay

## 2020-10-30 MED ORDER — METOPROLOL TARTRATE 100 MG PO TABS: ORAL_TABLET | ORAL | 0 refills | Status: DC

## 2020-10-30 NOTE — Telephone Encounter (Signed)
Attempted to call patient regarding upcoming cardiac CT appointment. Left message on voicemail with name and callback number  Gordy Clement RN Navigator Cardiac Imaging [facility] [phone] Office [phone] Cell  A one time dose of 177m metoprolol tartrate was sent to patient's pharmacy for CCTA after consultation with Dr. TAli Lowe  Patient is to take medication two hours prior to cardiac CT scan.

## 2020-10-30 NOTE — Telephone Encounter (Signed)
Patient returning call regarding upcoming cardiac imaging study; pt verbalizes understanding of appt date/time, parking situation and where to check in, pre-test NPO status and medications ordered, and verified current allergies; name and call back number provided for further questions should they arise  Gordy Clement RN Navigator Cardiac [facility] [phone] office [phone] cell  Patient to take 130m metoprolol tartrate two hours prior to cardiac CT scan.

## 2020-11-03 ENCOUNTER — Other Ambulatory Visit: Payer: Self-pay

## 2020-11-03 ENCOUNTER — Ambulatory Visit (HOSPITAL_COMMUNITY)
Admission: RE | Admit: 2020-11-03 | Discharge: 2020-11-03 | Disposition: A | Discharge status: Home / Self Care | Payer: No Typology Code available for payment source | Source: Ambulatory Visit | Attending: Internal Medicine | Admitting: Internal Medicine

## 2020-11-03 ENCOUNTER — Encounter (HOSPITAL_COMMUNITY): Payer: Self-pay

## 2020-11-03 DIAGNOSIS — R0602 Shortness of breath: Secondary | ICD-10-CM | POA: Insufficient documentation

## 2020-11-03 MED ORDER — IOHEXOL 350 MG/ML SOLN
95.0000 mL | Freq: Once | INTRAVENOUS | Status: AC | PRN
  Administered 2020-11-03: 95 mL via INTRAVENOUS

## 2020-11-03 MED ORDER — NITROGLYCERIN 0.4 MG SL SUBL
SUBLINGUAL_TABLET | SUBLINGUAL | Status: AC
  Administered 2020-11-03: 0.8 mg via SUBLINGUAL
  Filled 2020-11-03: qty 2

## 2020-11-03 MED ORDER — NITROGLYCERIN 0.4 MG SL SUBL: 0.8000 mg | SUBLINGUAL_TABLET | Freq: Once | SUBLINGUAL | Status: AC

## 2020-11-04 ENCOUNTER — Other Ambulatory Visit: Payer: Self-pay | Admitting: Family Medicine

## 2020-11-04 DIAGNOSIS — I10 Essential (primary) hypertension: Secondary | ICD-10-CM

## 2020-11-05 DIAGNOSIS — R002 Palpitations: Secondary | ICD-10-CM

## 2020-11-18 ENCOUNTER — Other Ambulatory Visit: Payer: Self-pay

## 2020-11-18 ENCOUNTER — Ambulatory Visit (HOSPITAL_BASED_OUTPATIENT_CLINIC_OR_DEPARTMENT_OTHER): Payer: No Typology Code available for payment source | Attending: Family Medicine | Admitting: Internal Medicine

## 2020-11-18 VITALS — Ht 65.0 in | Wt 255.0 lb

## 2020-11-18 DIAGNOSIS — G4733 Obstructive sleep apnea (adult) (pediatric): Secondary | ICD-10-CM | POA: Insufficient documentation

## 2020-11-20 ENCOUNTER — Other Ambulatory Visit: Payer: Self-pay

## 2020-11-21 ENCOUNTER — Ambulatory Visit: Payer: No Typology Code available for payment source | Admitting: Internal Medicine

## 2020-11-26 ENCOUNTER — Telehealth: Payer: Self-pay | Admitting: Internal Medicine

## 2020-11-26 MED ORDER — APIXABAN 5 MG PO TABS: 5.0000 mg | ORAL_TABLET | Freq: Two times a day (BID) | ORAL | 6 refills | Status: DC

## 2020-11-26 MED ORDER — DILTIAZEM HCL ER COATED BEADS 120 MG PO CP24: 120.0000 mg | ORAL_CAPSULE | Freq: Every day | ORAL | 3 refills | Status: DC

## 2020-11-26 NOTE — Telephone Encounter (Signed)
Monitor demonstrated AF.  Staff notified to inform patient, start Eliquis and cardizem 132m

## 2020-11-26 NOTE — Telephone Encounter (Signed)
calling to provide abnormal Zio patch results

## 2020-11-26 NOTE — Telephone Encounter (Signed)
Diamond with Zio called to report that the pt had rapid Afib, rate 185 for 60 sec on 11/13/20 at 6:19 am.   Per Dr. Ali Lowe... he has reviewed her report..   Pt advised Dr. Dara Hoyer recommendations.   I made her an appt for 12/18/20 after her Echo 12/08/20 per her request to further discuss but she will go ahead and start the Eliquis and Diltiazem.. I also left samples for her at the front desk with pt assistance if needed.

## 2020-11-29 DIAGNOSIS — G4733 Obstructive sleep apnea (adult) (pediatric): Secondary | ICD-10-CM

## 2020-11-29 NOTE — Procedures (Signed)
    Patient Name: Katherine Lewis, Katherine Lewis Date: 11/18/2020 Gender: Female D.O.B: 07/08/1970 Age (years): 32 Referring Provider: Denita Lung Height (inches): 1 Interpreting Physician: Baird Lyons MD, ABSM Weight (lbs): 258 RPSGT: Jacolyn Reedy BMI: 43 MRN: [id number] Neck Size: <br>  CLINICAL INFORMATION Sleep Study Type: HST Indication for sleep study: AFib Epworth Sleepiness Score: 16  SLEEP STUDY TECHNIQUE A multi-channel overnight portable sleep study was performed. The channels recorded were: nasal airflow, thoracic respiratory movement, and oxygen saturation with a pulse oximetry. Snoring was also monitored.  MEDICATIONS Patient self administered medications include: none reported.  SLEEP ARCHITECTURE Patient was studied for 416.5 minutes. The sleep efficiency was 100.0 % and the patient was supine for 0%. The arousal index was 0.0 per hour.  RESPIRATORY PARAMETERS The overall AHI was 77.2 per hour, with a central apnea index of 0 per hour. The oxygen nadir was 72% during sleep.  CARDIAC DATA Mean heart rate during sleep was 88.0 bpm.  IMPRESSIONS - Severe obstructive sleep apnea occurred during this study (AHI = 77.2/h). - Severe oxygen desaturation was noted during this study (Min O2 = 72%). Mean O2 saturation 88%. Time with O2 saturation 89% or less was 169 minutes. - Patient snored.  DIAGNOSIS - Obstructive Sleep Apnea (G47.33) - Nocturnal Hypoxemia (G47.36)  RECOMMENDATIONS - Suggest CPAP titration sleep study or autopap. Other options would be based on clinical judgment. - Be careful with alcohol, sedatives and other CNS depressants that may worsen sleep apnea and disrupt normal sleep architecture. - Sleep hygiene should be reviewed to assess factors that may improve sleep quality. - Weight management and regular exercise should be initiated or continued.  [Electronically signed] 11/29/2020 12:36 PM  Baird Lyons MD, [place], American  Board of Sleep Medicine   NPI: [id number]                         [place], [facility]  ELECTRONICALLY SIGNED ON:  11/29/2020, 12:36 PM [place] PH: [phone]   FX: [phone] [place]

## 2020-12-01 ENCOUNTER — Encounter: Payer: Self-pay | Admitting: Family Medicine

## 2020-12-01 DIAGNOSIS — G4736 Sleep related hypoventilation in conditions classified elsewhere: Secondary | ICD-10-CM | POA: Insufficient documentation

## 2020-12-04 ENCOUNTER — Encounter: Payer: Self-pay | Admitting: Family Medicine

## 2020-12-04 MED ORDER — BENZONATATE 100 MG PO CAPS: 200.0000 mg | ORAL_CAPSULE | Freq: Two times a day (BID) | ORAL | 0 refills | Status: DC | PRN

## 2020-12-08 ENCOUNTER — Ambulatory Visit (HOSPITAL_COMMUNITY): Payer: No Typology Code available for payment source | Attending: Internal Medicine

## 2020-12-08 ENCOUNTER — Other Ambulatory Visit: Payer: Self-pay

## 2020-12-08 ENCOUNTER — Encounter: Payer: Self-pay | Admitting: Family Medicine

## 2020-12-08 DIAGNOSIS — R0602 Shortness of breath: Secondary | ICD-10-CM | POA: Diagnosis present

## 2020-12-08 LAB — ECHOCARDIOGRAM COMPLETE
Area-P 1/2: 3.76 cm2
S' Lateral: 3.4 cm

## 2020-12-10 ENCOUNTER — Telehealth: Payer: No Typology Code available for payment source | Admitting: Family Medicine

## 2020-12-10 ENCOUNTER — Encounter: Payer: Self-pay | Admitting: Family Medicine

## 2020-12-10 ENCOUNTER — Telehealth (INDEPENDENT_AMBULATORY_CARE_PROVIDER_SITE_OTHER): Payer: No Typology Code available for payment source | Admitting: Family Medicine

## 2020-12-10 ENCOUNTER — Other Ambulatory Visit: Payer: Self-pay

## 2020-12-10 VITALS — Temp 97.0°F | Wt 255.0 lb

## 2020-12-10 DIAGNOSIS — J019 Acute sinusitis, unspecified: Secondary | ICD-10-CM

## 2020-12-10 MED ORDER — AMOXICILLIN 875 MG PO TABS: 875.0000 mg | ORAL_TABLET | Freq: Two times a day (BID) | ORAL | 0 refills | Status: DC

## 2020-12-10 MED ORDER — FLUCONAZOLE 150 MG PO TABS
150.0000 mg | ORAL_TABLET | Freq: Once | ORAL | 0 refills | Status: AC
Start: 2020-12-10 — End: 2020-12-10

## 2020-12-10 NOTE — Progress Notes (Signed)
   Subjective:    Patient ID: Katherine Lewis, female    DOB: [birth year], 50 y.o.   MRN: [id number]  HPI Documentation for virtual audio and video telecommunications through [place] encounter: The patient was located at home. 2 patient identifiers used.  The provider was located in the office. The patient did consent to this visit and is aware of possible charges through their insurance for this visit. The other persons participating in this telemedicine service were none. Time spent on call was 5 minutes and in review of previous records >20 minutes total for counseling and coordination of care. This virtual service is not related to other E/M service within previous 7 days.  She states that she has a 10-day history of started with headache, fever, myalgias and a dry cough.  The coughing has gotten to the point where occasionally she would vomit.  She also complains of some sinus congestion as well as PND and right ear discomfort.  Review of Systems     Objective:   Physical Exam Alert and in no distress.  Her voice sounds normal.  She was not coughing.       Assessment & Plan:  Acute non-recurrent sinusitis, unspecified location - Plan: fluconazole (DIFLUCAN) 150 MG tablet, amoxicillin (AMOXIL) 875 MG tablet Her symptoms are more consistent with a sinus infection.  I will give her Amoxil for that.  She has had difficulty with antibiotics with vaginitis and I will therefore give her Diflucan.  She was comfortable with that.

## 2020-12-16 NOTE — Progress Notes (Signed)
Cardiology Office Note:    Date:  12/18/2020   ID:  Katherine Lewis, DOB Dec 28, 1970, MRN [id number]  PCP:  Denita Lung, MD  Cardiologist:  None  Electrophysiologist:  None   Referring MD: Denita Lung, MD   Chief Complaint/Reason for Referral: Follow up atrial fibrillation  History of Present Illness:    PROBLEM LIST: 1.  Obstructive sleep apnea (per patient severe); awaiting CPAP fitting 2.  Hyperlipidemia 3.  Hypertension 4.  Palpitations; Kardia monitor demonstrated 30 seconds of atrial fibrillation; Zio monitor 11/22 with atrial fibrillation, CHADS2-VASC score 2  The patient returns for follow-up.  When I saw her last I referred her for a monitor which showed atrial fibrillation.  An echocardiogram was also done which demonstrated normal ejection fraction with a mildly dilated left atrium.  Given her CHA2DS2-VASc score of 2 I recommended Eliquis and diltiazem.  The patient tells me that she is woken up basically every night with heart pounding with a regular pulse and shortness of breath.  She recently had a sleep study and was told she has severe sleep apnea.  She is awaiting CPAP placement.  She otherwise denies any significant cardiovascular symptoms including exertional angina, dyspnea on exertion, peripheral edema, orthopnea.  She has required no emergency room visits or hospitalizations.  Past Medical History:  Diagnosis Date   Anxiety    Depression    GERD (gastroesophageal reflux disease)    Hypertension    Hypothyroidism    Insomnia    Plantar fasciitis    Sleep apnea    does not wear Cpap   Vitamin D deficiency     Past Surgical History:  Procedure Laterality Date   LAPAROSCOPIC GASTRIC SLEEVE RESECTION N/A 04/23/2014   Procedure: LAPAROSCOPIC GASTRIC SLEEVE RESECTION WITH UPPER ENDOSCOPY;  Surgeon: Johnathan Hausen, MD;  Location: [facility];  Service: General;  Laterality: N/A;   MYRINGOTOMY     bilateral    TOTAL KNEE ARTHROPLASTY Right 01/02/2019    Procedure: RIGHT TOTAL KNEE ARTHROPLASTY;  Surgeon: Meredith Pel, MD;  Location: [facility];  Service: Orthopedics;  Laterality: Right;    Current Medications: Current Meds  Medication Sig   amoxicillin (AMOXIL) 875 MG tablet Take 1 tablet (875 mg total) by mouth 2 (two) times daily.   apixaban (ELIQUIS) 5 MG TABS tablet Take 1 tablet (5 mg total) by mouth 2 (two) times daily.   benzonatate (TESSALON) 100 MG capsule Take 2 capsules (200 mg total) by mouth 2 (two) times daily as needed for cough.   diltiazem (CARDIZEM CD) 120 MG 24 hr capsule Take 1 capsule (120 mg total) by mouth daily.   escitalopram (LEXAPRO) 5 MG tablet TAKE 1 TABLET ONCE DAILY.   fluconazole (DIFLUCAN) 150 MG tablet Take 150 mg by mouth once.   levonorgestrel (MIRENA) 20 MCG/24HR IUD 1 each by Intrauterine route once. Reported on 06/23/2015   losartan-hydrochlorothiazide (HYZAAR) 50-12.5 MG tablet TAKE 1/2 TABLET ONCE DAILY.   pantoprazole (PROTONIX) 40 MG tablet TAKE ONE TABLET ONCE DAILY     Allergies:    Allergies  Allergen Reactions   Codeine Nausea Only    Social History   Tobacco Use   Smoking status: Former    Packs/day: 0.50    Years: 6.00    Pack years: 3.00    Types: Cigarettes    Quit date: 01/19/1992    Years since quitting: 28.9   Smokeless tobacco: Never   Tobacco comments:    social   Vaping Use  Vaping Use: Never used  Substance Use Topics   Alcohol use: Yes    Comment: social    Drug use: No     Family History: Family History  Problem Relation Age of Onset   Diabetes type II Mother    Diabetes Mother    Lung cancer Paternal Grandmother    Lung cancer Paternal Grandfather      ROS:   Please see the history of present illness.    All other systems reviewed and are negative.  EKGs/Labs/Other Studies Reviewed:    The following studies were reviewed today:  EKG:  n/a  Imaging studies that I have independently reviewed today:   Coronary CTA 10/22 1. Coronary calcium  score of 0. This was 0 percentile for age-, sex, and race-matched controls.   2. Normal coronary origin with right dominance.   3. Normal coronary arteries.  CAD RADS 0.  Zio 11/22: Patient had a min HR of 57 bpm, max HR of 224 bpm, and avg HR of 87 bpm. Predominant underlying rhythm was Sinus Rhythm. Atrial Fibrillation occurred (<1% burden), ranging from 105-224 bpm (avg of 152 bpm), the longest lasting 1 hour 49 mins with an avg rate of 152 bpm. Atrial Fibrillation was detected within +/- 45 seconds of symptomatic patient event(s). Isolated SVEs were rare (<1.0%), SVE Couplets were rare (<1.0%), and SVE Triplets were rare (<1.0%). Isolated VEs were rare (<1.0%), and no VE Couplets or VE Triplets were present.    Recent Labs: 10/02/2020: ALT 19; Hemoglobin 15.2; Platelets 241; TSH 1.990 10/24/2020: BUN 12; Creatinine, Ser 0.80; Potassium 3.8; Sodium 139  Recent Lipid Panel    Component Value Date/Time   CHOL 222 (H) 10/02/2020 1004   TRIG 219 (H) 10/02/2020 1004   HDL 44 10/02/2020 1004   CHOLHDL 5.0 (H) 10/02/2020 1004   LDLCALC 139 (H) 10/02/2020 1004    Physical Exam:    VS:  BP 138/84 (BP Location: Right Arm, Patient Position: Sitting, Cuff Size: Large)   Pulse 82   Ht 5' 5"  (1.651 m)   Wt 253 lb (114.8 kg)   SpO2 99%   BMI 42.10 kg/m     Wt Readings from Last 5 Encounters:  12/18/20 253 lb (114.8 kg)  12/10/20 255 lb (115.7 kg)  11/18/20 255 lb (115.7 kg)  10/24/20 257 lb (116.6 kg)  10/21/20 258 lb 9.6 oz (117.3 kg)    GENERAL:  No apparent distress, AOx3 HEENT:  No carotid bruits, +2 carotid impulses, no scleral icterus CAR: RRR no murmurs, gallops, rubs, or thrills RES:  Clear to auscultation bilaterally ABD:  Soft, nontender, nondistended, positive bowel sounds x 4 VASC:  +2 radial pulses, +2 carotid pulses, palpable pedal pulses NEURO:  CN 2-12 grossly intact; motor and sensory grossly intact PSYCH:  No active depression or anxiety EXT:  No edema,  ecchymosis, or cyanosis  ASSESSMENT:    1. Atrial fibrillation, unspecified type ([place])   2. Essential hypertension   3. OSA (obstructive sleep apnea)    PLAN:    Atrial fibrillation, unspecified type ([place]) Will continue Eliquis and diltiazem.  I believe her symptoms are nocturnal spells of shortness of breath and palpitations are due to untreated sleep apnea.  I will see her back in 6 months or earlier if needed.  Essential hypertension Her blood pressure is well controlled on her current regimen.  OSA (obstructive sleep apnea) I will reach out to her PCP as she has not heard back from staff regarding CPAP.  Shared Decision Making/Informed Consent:       Medication Adjustments/Labs and Tests Ordered: Current medicines are reviewed at length with the patient today.  Concerns regarding medicines are outlined above.   No orders of the defined types were placed in this encounter.   No orders of the defined types were placed in this encounter.   Patient Instructions  Medication Instructions:  Your physician recommends that you continue on your current medications as directed. Please refer to the Current Medication list given to you today.  *If you need a refill on your cardiac medications before your next appointment, please call your pharmacy*   Lab Work: NONE If you have labs (blood work) drawn today and your tests are completely normal, you will receive your results only by: [place] (if you have MyChart) OR A paper copy in the mail If you have any lab test that is abnormal or we need to change your treatment, we will call you to review the results.   Testing/Procedures: NONE   Follow-Up: At [facility], you and your health needs are our priority.  As part of our continuing mission to provide you with exceptional heart care, we have created designated Provider Care Teams.  These Care Teams include your primary Cardiologist (physician) and Advanced  Practice Providers (APPs -  Physician Assistants and Nurse Practitioners) who all work together to provide you with the care you need, when you need it.  We recommend signing up for the patient portal called "MyChart".  Sign up information is provided on this After Visit Summary.  MyChart is used to connect with patients for Virtual Visits (Telemedicine).  Patients are able to view lab/test results, encounter notes, upcoming appointments, etc.  Non-urgent messages can be sent to your provider as well.   To learn more about what you can do with MyChart, go to [email or web address].    Your next appointment:   6 month(s)  The format for your next appointment:   In Person  Provider:   DR. Ali Lowe

## 2020-12-18 ENCOUNTER — Ambulatory Visit: Payer: No Typology Code available for payment source | Admitting: Internal Medicine

## 2020-12-18 ENCOUNTER — Encounter: Payer: Self-pay | Admitting: Internal Medicine

## 2020-12-18 ENCOUNTER — Encounter: Payer: Self-pay | Admitting: Family Medicine

## 2020-12-18 ENCOUNTER — Other Ambulatory Visit: Payer: Self-pay

## 2020-12-18 VITALS — BP 138/84 | HR 82 | Ht 65.0 in | Wt 253.0 lb

## 2020-12-18 DIAGNOSIS — I10 Essential (primary) hypertension: Secondary | ICD-10-CM

## 2020-12-18 DIAGNOSIS — I4891 Unspecified atrial fibrillation: Secondary | ICD-10-CM | POA: Diagnosis not present

## 2020-12-18 DIAGNOSIS — G4733 Obstructive sleep apnea (adult) (pediatric): Secondary | ICD-10-CM | POA: Diagnosis not present

## 2020-12-18 MED ORDER — AMOXICILLIN-POT CLAVULANATE 875-125 MG PO TABS: 1.0000 | ORAL_TABLET | Freq: Two times a day (BID) | ORAL | 0 refills | Status: DC

## 2020-12-18 NOTE — Patient Instructions (Signed)
Medication Instructions:  Your physician recommends that you continue on your current medications as directed. Please refer to the Current Medication list given to you today.  *If you need a refill on your cardiac medications before your next appointment, please call your pharmacy*   Lab Work: NONE If you have labs (blood work) drawn today and your tests are completely normal, you will receive your results only by: [place] (if you have MyChart) OR A paper copy in the mail If you have any lab test that is abnormal or we need to change your treatment, we will call you to review the results.   Testing/Procedures: NONE   Follow-Up: At [facility], you and your health needs are our priority.  As part of our continuing mission to provide you with exceptional heart care, we have created designated Provider Care Teams.  These Care Teams include your primary Cardiologist (physician) and Advanced Practice Providers (APPs -  Physician Assistants and Nurse Practitioners) who all work together to provide you with the care you need, when you need it.  We recommend signing up for the patient portal called "MyChart".  Sign up information is provided on this After Visit Summary.  MyChart is used to connect with patients for Virtual Visits (Telemedicine).  Patients are able to view lab/test results, encounter notes, upcoming appointments, etc.  Non-urgent messages can be sent to your provider as well.   To learn more about what you can do with MyChart, go to [email or web address].    Your next appointment:   6 month(s)  The format for your next appointment:   In Person  Provider:   DR. Ali Lowe

## 2020-12-30 ENCOUNTER — Other Ambulatory Visit: Payer: Self-pay | Admitting: Family Medicine

## 2020-12-30 DIAGNOSIS — F341 Dysthymic disorder: Secondary | ICD-10-CM

## 2020-12-30 NOTE — Telephone Encounter (Signed)
[facility] requesting to fill pt lexapro . Please advise [facility]

## 2021-01-01 ENCOUNTER — Encounter: Payer: Self-pay | Admitting: Internal Medicine

## 2021-01-26 ENCOUNTER — Other Ambulatory Visit: Payer: Self-pay

## 2021-01-26 DIAGNOSIS — G4733 Obstructive sleep apnea (adult) (pediatric): Secondary | ICD-10-CM

## 2021-01-31 ENCOUNTER — Other Ambulatory Visit: Payer: Self-pay | Admitting: Family Medicine

## 2021-01-31 DIAGNOSIS — I10 Essential (primary) hypertension: Secondary | ICD-10-CM

## 2021-02-02 ENCOUNTER — Ambulatory Visit (INDEPENDENT_AMBULATORY_CARE_PROVIDER_SITE_OTHER): Payer: No Typology Code available for payment source | Admitting: Family Medicine

## 2021-02-02 ENCOUNTER — Other Ambulatory Visit: Payer: Self-pay

## 2021-02-02 ENCOUNTER — Encounter: Payer: Self-pay | Admitting: Family Medicine

## 2021-02-02 VITALS — BP 130/90 | HR 75 | Temp 96.0°F | Ht 64.0 in | Wt 250.4 lb

## 2021-02-02 DIAGNOSIS — Z975 Presence of (intrauterine) contraceptive device: Secondary | ICD-10-CM

## 2021-02-02 DIAGNOSIS — G4736 Sleep related hypoventilation in conditions classified elsewhere: Secondary | ICD-10-CM

## 2021-02-02 DIAGNOSIS — I10 Essential (primary) hypertension: Secondary | ICD-10-CM | POA: Diagnosis not present

## 2021-02-02 DIAGNOSIS — G4730 Sleep apnea, unspecified: Secondary | ICD-10-CM | POA: Diagnosis not present

## 2021-02-02 DIAGNOSIS — E669 Obesity, unspecified: Secondary | ICD-10-CM | POA: Diagnosis not present

## 2021-02-02 DIAGNOSIS — Z8679 Personal history of other diseases of the circulatory system: Secondary | ICD-10-CM | POA: Diagnosis not present

## 2021-02-02 DIAGNOSIS — F341 Dysthymic disorder: Secondary | ICD-10-CM | POA: Diagnosis not present

## 2021-02-02 DIAGNOSIS — I4891 Unspecified atrial fibrillation: Secondary | ICD-10-CM

## 2021-02-02 DIAGNOSIS — Z0000 Encounter for general adult medical examination without abnormal findings: Secondary | ICD-10-CM

## 2021-02-02 DIAGNOSIS — Z96651 Presence of right artificial knee joint: Secondary | ICD-10-CM

## 2021-02-02 DIAGNOSIS — Z9884 Bariatric surgery status: Secondary | ICD-10-CM

## 2021-02-02 LAB — CBC WITH DIFFERENTIAL/PLATELET
Basophils Absolute: 0.1 10*3/uL (ref 0.0–0.2)
Basos: 1 %
EOS (ABSOLUTE): 0.1 10*3/uL (ref 0.0–0.4)
Eos: 2 %
Hematocrit: 43.5 % (ref 34.0–46.6)
Hemoglobin: 14.9 g/dL (ref 11.1–15.9)
Immature Grans (Abs): 0 10*3/uL (ref 0.0–0.1)
Immature Granulocytes: 0 %
Lymphocytes Absolute: 1.8 10*3/uL (ref 0.7–3.1)
Lymphs: 28 %
MCH: 31.3 pg (ref 26.6–33.0)
MCHC: 34.3 g/dL (ref 31.5–35.7)
MCV: 91 fL (ref 79–97)
Monocytes Absolute: 0.4 10*3/uL (ref 0.1–0.9)
Monocytes: 7 %
Neutrophils Absolute: 3.9 10*3/uL (ref 1.4–7.0)
Neutrophils: 62 %
Platelets: 224 10*3/uL (ref 150–450)
RBC: 4.76 x10E6/uL (ref 3.77–5.28)
RDW: 12.3 % (ref 11.7–15.4)
WBC: 6.3 10*3/uL (ref 3.4–10.8)

## 2021-02-02 LAB — COMPREHENSIVE METABOLIC PANEL
ALT: 21 IU/L (ref 0–32)
AST: 18 IU/L (ref 0–40)
Albumin/Globulin Ratio: 1.9 (ref 1.2–2.2)
Albumin: 4.4 g/dL (ref 3.8–4.8)
Alkaline Phosphatase: 67 IU/L (ref 44–121)
BUN/Creatinine Ratio: 10 (ref 9–23)
BUN: 8 mg/dL (ref 6–24)
Bilirubin Total: 0.5 mg/dL (ref 0.0–1.2)
CO2: 26 mmol/L (ref 20–29)
Calcium: 9.5 mg/dL (ref 8.7–10.2)
Chloride: 100 mmol/L (ref 96–106)
Creatinine, Ser: 0.78 mg/dL (ref 0.57–1.00)
Globulin, Total: 2.3 g/dL (ref 1.5–4.5)
Glucose: 96 mg/dL (ref 70–99)
Potassium: 4.2 mmol/L (ref 3.5–5.2)
Sodium: 140 mmol/L (ref 134–144)
Total Protein: 6.7 g/dL (ref 6.0–8.5)
eGFR: 92 mL/min/{1.73_m2} (ref >59)

## 2021-02-02 LAB — LIPID PANEL
Chol/HDL Ratio: 4.8 ratio — ABNORMAL HIGH (ref 0.0–4.4)
Cholesterol, Total: 208 mg/dL — ABNORMAL HIGH (ref 100–199)
HDL: 43 mg/dL (ref >39)
LDL Chol Calc (NIH): 135 mg/dL — ABNORMAL HIGH (ref 0–99)
Triglycerides: 165 mg/dL — ABNORMAL HIGH (ref 0–149)
VLDL Cholesterol Cal: 30 mg/dL (ref 5–40)

## 2021-02-02 NOTE — Progress Notes (Signed)
° °  Subjective:    Patient ID: Katherine Lewis, female    DOB: September 22, 1970, 51 y.o.   MRN: [id number]  HPI She is here for complete examination.  She was recently diagnosed with OSA and has been on the CPAP since December 9.  The record was reviewed and shows that she is over 80% with number of hours and averaging 13 cm water.  She has noted an increase in her energy as well as mental acuity since being on the CPAP.  The pulse ox is in the 95% range.  She does wake up refreshed.  She is very happy with this.  She had to temporarily stop using the CPAP since she had some minor eye surgery done.  She noted during that timeframe that her heart rate became irregular again.  She does have reflux and takes pantoprazole daily.  She continues on Eliquis and diltiazem.  She does not smoke but does drink usually 2 glasses of wine per night.  Her exercise is minimal.  She is starting to walk her dog.  She plans to see her gynecologist and will also get a mammogram done.  Cologuard was done in 2021 she continues on Lexapro and is doing quite nicely on that. Otherwise her family and social history as well as health maintenance and immunizations was reviewed  Review of Systems  All other systems reviewed and are negative.     Objective:   Physical Exam Alert and in no distress. Tympanic membranes and canals are normal. Pharyngeal area is normal. Neck is supple without adenopathy or thyromegaly. Cardiac exam shows a regular sinus rhythm without murmurs or gallops. Lungs are clear to auscultation.        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   Hopefully this will also help her become more physically active and therefore help with her weight.  Discussed possibly stopping the Lexapro and some wondering how much of her dysthymia was really secondary to her OSA.  We will readdress this later in the year.  Encouraged her to cut back on her alcohol consumption to 1 glass per night.  Also encouraged her to walk for at least 20 minutes every day but she can break it up into 5-minute increments.

## 2021-02-12 LAB — HM PAP SMEAR

## 2021-02-12 LAB — RESULTS CONSOLE HPV: CHL HPV: NEGATIVE

## 2021-02-20 ENCOUNTER — Ambulatory Visit: Payer: No Typology Code available for payment source | Admitting: Internal Medicine

## 2021-03-28 ENCOUNTER — Other Ambulatory Visit: Payer: Self-pay | Admitting: Family Medicine

## 2021-04-05 ENCOUNTER — Other Ambulatory Visit: Payer: Self-pay | Admitting: Family Medicine

## 2021-04-05 DIAGNOSIS — F341 Dysthymic disorder: Secondary | ICD-10-CM

## 2021-04-06 NOTE — Telephone Encounter (Signed)
[facility] is requesting to fill pt lexapro. Please advise. [place] ?

## 2021-04-27 ENCOUNTER — Encounter: Payer: Self-pay | Admitting: Family Medicine

## 2021-04-29 ENCOUNTER — Other Ambulatory Visit: Payer: Self-pay | Admitting: Family Medicine

## 2021-04-29 DIAGNOSIS — I10 Essential (primary) hypertension: Secondary | ICD-10-CM

## 2021-04-30 ENCOUNTER — Telehealth: Payer: No Typology Code available for payment source | Admitting: Family Medicine

## 2021-05-18 ENCOUNTER — Encounter: Payer: Self-pay | Admitting: Family Medicine

## 2021-05-18 ENCOUNTER — Ambulatory Visit (INDEPENDENT_AMBULATORY_CARE_PROVIDER_SITE_OTHER): Payer: No Typology Code available for payment source | Admitting: Family Medicine

## 2021-05-18 VITALS — BP 134/84 | HR 74 | Temp 97.7°F | Wt 269.2 lb

## 2021-05-18 DIAGNOSIS — H66002 Acute suppurative otitis media without spontaneous rupture of ear drum, left ear: Secondary | ICD-10-CM

## 2021-05-18 DIAGNOSIS — E669 Obesity, unspecified: Secondary | ICD-10-CM

## 2021-05-18 MED ORDER — FLUCONAZOLE 150 MG PO TABS: ORAL_TABLET | ORAL | 0 refills | Status: DC

## 2021-05-18 MED ORDER — AMOXICILLIN-POT CLAVULANATE 875-125 MG PO TABS: 1.0000 | ORAL_TABLET | Freq: Two times a day (BID) | ORAL | 0 refills | Status: DC

## 2021-05-19 NOTE — Progress Notes (Signed)
? ?  Subjective:  ? ? Patient ID: Katherine Lewis, female    DOB: July 01, 1970, 51 y.o.   MRN: [id number] ? ?HPI ?She complains of a weeklong history of left earache but no fever, chills, sore throat.  She has tried various over-the-counter products without success.  She would also like to talk about weight loss. ? ? ?Review of Systems ? ?   ?Objective:  ? Physical Exam ?Alert and in no distress. Tympanic membrane on the left is slightly dull, right is normal, canals are normal. Pharyngeal area is normal. Neck is supple without adenopathy or thyromegaly. Cardiac exam shows a regular sinus rhythm without murmurs or gallops. Lungs are clear to auscultation. ? ? ? ? ?   ?Assessment & Plan:  ?Acute suppurative otitis media of left ear without spontaneous rupture of tympanic membrane, recurrence not specified - Plan: amoxicillin-clavulanate (AUGMENTIN) 875-125 MG tablet, fluconazole (DIFLUCAN) 150 MG tablet ? ?Obesity (BMI 35.0-39.9 without comorbidity) ?Diflucan also given due to yeast infection from the antibiotic. ?I then discussed weight loss with her.  She has had a gastric sleeve which has obviously not been successful.  We discussed surgery as well as the use of Wegovy.  Discussed the fact that she needs to check with your insurance to see if this is covered.  She also still needs to do the diet and exercise regimen rather than relying on this medicine entirely.  She will let me know whether her insurance will cover this. ? ?

## 2021-05-25 ENCOUNTER — Ambulatory Visit (INDEPENDENT_AMBULATORY_CARE_PROVIDER_SITE_OTHER): Payer: No Typology Code available for payment source | Admitting: Family Medicine

## 2021-05-25 ENCOUNTER — Encounter: Payer: Self-pay | Admitting: Family Medicine

## 2021-05-25 VITALS — BP 128/82 | HR 77 | Temp 97.7°F | Wt 270.0 lb

## 2021-05-25 DIAGNOSIS — E669 Obesity, unspecified: Secondary | ICD-10-CM

## 2021-05-25 NOTE — Progress Notes (Signed)
? ?  Subjective:  ? ? Patient ID: Katherine Lewis, female    DOB: 1971-01-04, 51 y.o.   MRN: [id number] ? ?HPI ?Good morning she is here for consult concerning starting Wegovy.  Her insurance will cover it.  In the past she has had a gastric sleeve.  She has also tried various weight loss protocols none of which have been very successful. ? ? ?Review of Systems ? ?   ?Objective:  ? Physical Exam ?Alert and in no distress otherwise not examined ? ? ? ?   ?Assessment & Plan:  ? ?Obesity (BMI 35.0-39.9 without comorbidity) ?An initial dose of Wegovy was given.  I demonstrated how to use the medication.  Discussed side effects including nausea, vomiting, diarrhea etc.  She is getting ready to go on a trip and so might take a while to get her back on schedule.  She will call if she has any side effects and if things are going well I will call in the next highest dose and shortly after that see her to discuss how she is doing.  She was comfortable with that. ?

## 2021-06-01 ENCOUNTER — Encounter: Payer: Self-pay | Admitting: Family Medicine

## 2021-06-01 DIAGNOSIS — H66002 Acute suppurative otitis media without spontaneous rupture of ear drum, left ear: Secondary | ICD-10-CM

## 2021-06-01 MED ORDER — FLUCONAZOLE 150 MG PO TABS: ORAL_TABLET | ORAL | 0 refills | Status: DC

## 2021-06-11 ENCOUNTER — Encounter: Payer: Self-pay | Admitting: Family Medicine

## 2021-06-11 MED ORDER — WEGOVY 1 MG/0.5ML ~~LOC~~ SOAJ: 1.0000 mg | SUBCUTANEOUS | 0 refills | Status: DC

## 2021-06-15 ENCOUNTER — Ambulatory Visit
Admission: EM | Admit: 2021-06-15 | Discharge: 2021-06-15 | Disposition: A | Discharge status: Home / Self Care | Payer: No Typology Code available for payment source

## 2021-06-15 ENCOUNTER — Telehealth: Payer: Self-pay

## 2021-06-15 ENCOUNTER — Other Ambulatory Visit: Payer: Self-pay

## 2021-06-15 ENCOUNTER — Emergency Department (HOSPITAL_COMMUNITY)
Admission: EM | Admit: 2021-06-15 | Discharge: 2021-06-15 | Disposition: A | Discharge status: Home / Self Care | Payer: No Typology Code available for payment source | Attending: Emergency Medicine | Admitting: Emergency Medicine

## 2021-06-15 ENCOUNTER — Encounter (HOSPITAL_COMMUNITY): Payer: Self-pay

## 2021-06-15 ENCOUNTER — Emergency Department (HOSPITAL_COMMUNITY): Payer: No Typology Code available for payment source

## 2021-06-15 DIAGNOSIS — I10 Essential (primary) hypertension: Secondary | ICD-10-CM | POA: Insufficient documentation

## 2021-06-15 DIAGNOSIS — Z20822 Contact with and (suspected) exposure to covid-19: Secondary | ICD-10-CM | POA: Diagnosis not present

## 2021-06-15 DIAGNOSIS — R61 Generalized hyperhidrosis: Secondary | ICD-10-CM

## 2021-06-15 DIAGNOSIS — Z79899 Other long term (current) drug therapy: Secondary | ICD-10-CM | POA: Insufficient documentation

## 2021-06-15 DIAGNOSIS — R0603 Acute respiratory distress: Secondary | ICD-10-CM

## 2021-06-15 DIAGNOSIS — R0602 Shortness of breath: Secondary | ICD-10-CM | POA: Diagnosis present

## 2021-06-15 DIAGNOSIS — R000 Tachycardia, unspecified: Secondary | ICD-10-CM

## 2021-06-15 DIAGNOSIS — R791 Abnormal coagulation profile: Secondary | ICD-10-CM | POA: Insufficient documentation

## 2021-06-15 DIAGNOSIS — Z7901 Long term (current) use of anticoagulants: Secondary | ICD-10-CM | POA: Insufficient documentation

## 2021-06-15 DIAGNOSIS — R509 Fever, unspecified: Secondary | ICD-10-CM

## 2021-06-15 DIAGNOSIS — J069 Acute upper respiratory infection, unspecified: Secondary | ICD-10-CM | POA: Diagnosis not present

## 2021-06-15 DIAGNOSIS — R0600 Dyspnea, unspecified: Secondary | ICD-10-CM

## 2021-06-15 DIAGNOSIS — E039 Hypothyroidism, unspecified: Secondary | ICD-10-CM | POA: Insufficient documentation

## 2021-06-15 LAB — CBC WITH DIFFERENTIAL/PLATELET
Abs Immature Granulocytes: 0.02 10*3/uL (ref 0.00–0.07)
Basophils Absolute: 0 10*3/uL (ref 0.0–0.1)
Basophils Relative: 0 %
Eosinophils Absolute: 0 10*3/uL (ref 0.0–0.5)
Eosinophils Relative: 1 %
HCT: 40.2 % (ref 36.0–46.0)
Hemoglobin: 13.9 g/dL (ref 12.0–15.0)
Immature Granulocytes: 0 %
Lymphocytes Relative: 10 %
Lymphs Abs: 0.5 10*3/uL — ABNORMAL LOW (ref 0.7–4.0)
MCH: 32.3 pg (ref 26.0–34.0)
MCHC: 34.6 g/dL (ref 30.0–36.0)
MCV: 93.3 fL (ref 80.0–100.0)
Monocytes Absolute: 0.5 10*3/uL (ref 0.1–1.0)
Monocytes Relative: 11 %
Neutro Abs: 3.5 10*3/uL (ref 1.7–7.7)
Neutrophils Relative %: 78 %
Platelets: 142 10*3/uL — ABNORMAL LOW (ref 150–400)
RBC: 4.31 MIL/uL (ref 3.87–5.11)
RDW: 13.2 % (ref 11.5–15.5)
WBC: 4.5 10*3/uL (ref 4.0–10.5)
nRBC: 0 % (ref 0.0–0.2)

## 2021-06-15 LAB — COMPREHENSIVE METABOLIC PANEL
ALT: 23 U/L (ref 0–44)
AST: 22 U/L (ref 15–41)
Albumin: 3.8 g/dL (ref 3.5–5.0)
Alkaline Phosphatase: 55 U/L (ref 38–126)
Anion gap: 7 (ref 5–15)
BUN: 8 mg/dL (ref 6–20)
CO2: 30 mmol/L (ref 22–32)
Calcium: 9 mg/dL (ref 8.9–10.3)
Chloride: 100 mmol/L (ref 98–111)
Creatinine, Ser: 0.83 mg/dL (ref 0.44–1.00)
GFR, Estimated: >60 mL/min (ref >60)
Glucose, Bld: 98 mg/dL (ref 70–99)
Potassium: 3.9 mmol/L (ref 3.5–5.1)
Sodium: 137 mmol/L (ref 135–145)
Total Bilirubin: 0.8 mg/dL (ref 0.3–1.2)
Total Protein: 7.2 g/dL (ref 6.5–8.1)

## 2021-06-15 LAB — I-STAT BETA HCG BLOOD, ED (MC, WL, AP ONLY): I-stat hCG, quantitative: <5 m[IU]/mL (ref <5)

## 2021-06-15 LAB — TROPONIN I (HIGH SENSITIVITY)
Troponin I (High Sensitivity): 6 ng/L (ref <18)
Troponin I (High Sensitivity): 7 ng/L (ref <18)

## 2021-06-15 LAB — BRAIN NATRIURETIC PEPTIDE: B Natriuretic Peptide: 50.2 pg/mL (ref 0.0–100.0)

## 2021-06-15 LAB — MAGNESIUM: Magnesium: 2 mg/dL (ref 1.7–2.4)

## 2021-06-15 LAB — SARS CORONAVIRUS 2 BY RT PCR: SARS Coronavirus 2 by RT PCR: NEGATIVE

## 2021-06-15 LAB — D-DIMER, QUANTITATIVE: D-Dimer, Quant: 0.99 ug/mL-FEU — ABNORMAL HIGH (ref 0.00–0.50)

## 2021-06-15 MED ORDER — LACTATED RINGERS IV BOLUS
1000.0000 mL | Freq: Once | INTRAVENOUS | Status: AC
  Administered 2021-06-15: 1000 mL via INTRAVENOUS

## 2021-06-15 MED ORDER — ONDANSETRON 4 MG PO TBDP: 4.0000 mg | ORAL_TABLET | Freq: Three times a day (TID) | ORAL | 0 refills | Status: DC | PRN

## 2021-06-15 MED ORDER — SODIUM CHLORIDE (PF) 0.9 % IJ SOLN
INTRAMUSCULAR | Status: AC
  Filled 2021-06-15: qty 50

## 2021-06-15 MED ORDER — IBUPROFEN 200 MG PO TABS
600.0000 mg | ORAL_TABLET | Freq: Once | ORAL | Status: AC
  Administered 2021-06-15: 600 mg via ORAL
  Filled 2021-06-15: qty 3

## 2021-06-15 MED ORDER — IOHEXOL 350 MG/ML SOLN
100.0000 mL | Freq: Once | INTRAVENOUS | Status: AC | PRN
  Administered 2021-06-15: 100 mL via INTRAVENOUS

## 2021-06-15 MED ORDER — HYDROCODONE BIT-HOMATROP MBR 5-1.5 MG/5ML PO SOLN
5.0000 mL | Freq: Four times a day (QID) | ORAL | 0 refills | Status: DC | PRN
Start: 2021-06-15 — End: 2021-06-22

## 2021-06-15 MED ORDER — BENZONATATE 100 MG PO CAPS: 100.0000 mg | ORAL_CAPSULE | Freq: Three times a day (TID) | ORAL | 0 refills | Status: DC | PRN

## 2021-06-15 MED ORDER — ALBUTEROL SULFATE HFA 108 (90 BASE) MCG/ACT IN AERS: 2.0000 | INHALATION_SPRAY | RESPIRATORY_TRACT | Status: DC | PRN

## 2021-06-15 NOTE — ED Notes (Signed)
Patient is being discharged from the Urgent Care and sent to the Emergency Department via POA with spouse. Per L. Morgan-Scales PA-C , patient is in need of higher level of care due to need for further evaluation. Patient is aware and verbalizes understanding of plan of care.  Vitals:   06/15/21 1002 06/15/21 1008  BP:    Pulse:    Resp:    Temp:    SpO2: 95% 91%

## 2021-06-15 NOTE — Discharge Instructions (Signed)
Continue supportive care at home: Hydration, ibuprofen and Tylenol as needed for fevers and aches.  They were prescription sent to your pharmacy for cough suppressant medications.  Take this as needed.  Additionally, there is a prescription sent for a medicine to treat nausea.  Take this as needed as well.  Follow-up with your primary care doctor soon as possible.  Return to the emergency department for any worsening of symptoms.

## 2021-06-15 NOTE — ED Provider Notes (Signed)
[facility] DEPT Provider Note   CSN: [id number] Arrival date & time: 06/15/21  1028     History  Chief Complaint  Patient presents with   Shortness of Breath    Katherine Lewis is a 51 y.o. female.   Shortness of Breath Associated symptoms: cough, fever and headaches   Patient presents for cough and fever.  Cough has been present for the past 2 days.  She developed fevers yesterday.  She was seen in urgent care prior to arrival.  While there, she was found to be febrile and hypoxic.  She was placed on supplemental oxygen with improvement in SPO2.  Her medical history includes anxiety, depression, GERD, HTN, hypothyroidism, sleep apnea, atrial fibrillation, and sleeve gastrectomy in 2016.  Recently, she took a trip to [place].  While overseas, her partner developed URI symptoms.  Shortly after this, patient developed dry cough.  Patient and her partner returned home yesterday.  Yesterday, patient's symptoms worsened.  She developed fevers, myalgias, fatigue, headache, nausea.  She has not had any vomiting or diarrhea.  She denies any urinary symptoms.    Home Medications Prior to Admission medications   Medication Sig Start Date End Date Taking? Authorizing Provider  benzonatate (TESSALON) 100 MG capsule Take 1 capsule (100 mg total) by mouth 3 (three) times daily as needed for cough. 06/15/21  Yes Godfrey Pick, MD  HYDROcodone bit-homatropine (HYCODAN) 5-1.5 MG/5ML syrup Take 5 mLs by mouth every 6 (six) hours as needed for cough. 06/15/21  Yes Godfrey Pick, MD  ondansetron (ZOFRAN-ODT) 4 MG disintegrating tablet Take 1 tablet (4 mg total) by mouth every 8 (eight) hours as needed for nausea or vomiting. 06/15/21  Yes Godfrey Pick, MD  amoxicillin-clavulanate (AUGMENTIN) 875-125 MG tablet Take 1 tablet by mouth 2 (two) times daily. 05/18/21   Denita Lung, MD  apixaban (ELIQUIS) 5 MG TABS tablet Take 1 tablet (5 mg total) by mouth 2 (two) times daily. 11/26/20    Early Osmond, MD  diltiazem (CARDIZEM CD) 120 MG 24 hr capsule Take 1 capsule (120 mg total) by mouth daily. 11/26/20   Early Osmond, MD  escitalopram (LEXAPRO) 5 MG tablet TAKE ONE TABLET BY MOUTH ONCE DAILY 04/06/21   Denita Lung, MD  fluconazole (DIFLUCAN) 150 MG tablet Take as directed 06/01/21   Denita Lung, MD  levonorgestrel (MIRENA) 20 MCG/24HR IUD 1 each by Intrauterine route once. Reported on 06/23/2015    [provider]  losartan-hydrochlorothiazide Konrad Penta) 50-12.5 MG tablet TAKE 1/2 TABLET ONCE DAILY 04/29/21   Denita Lung, MD  pantoprazole (PROTONIX) 40 MG tablet TAKE ONE TABLET ONCE DAILY 03/30/21   Denita Lung, MD  Semaglutide-Weight Management [facility]) 1 MG/0.5ML SOAJ Inject 1 mg into the skin once a week. 06/11/21   Denita Lung, MD      Allergies    Codeine    Review of Systems   Review of Systems  Constitutional:  Positive for fatigue and fever.  Respiratory:  Positive for cough and shortness of breath.   Gastrointestinal:  Positive for nausea.  Musculoskeletal:  Positive for arthralgias and myalgias.  Neurological:  Positive for headaches.   Physical Exam Updated Vital Signs BP (!) 171/84   Pulse 97   Temp (!) 102 F (38.9 C) (Oral)   Resp 18   Ht 5' 5"  (1.651 m)   Wt 120.7 kg   SpO2 93%   BMI 44.26 kg/m  Physical Exam Vitals and nursing note  reviewed.  Constitutional:      General: She is not in acute distress.    Appearance: She is well-developed and normal weight. She is not ill-appearing, toxic-appearing or diaphoretic.  HENT:     Head: Normocephalic and atraumatic.     Right Ear: Tympanic membrane, ear canal and external ear normal.     Left Ear: Tympanic membrane, ear canal and external ear normal.     Mouth/Throat:     Mouth: Mucous membranes are moist.     Pharynx: Oropharynx is clear.  Eyes:     Extraocular Movements: Extraocular movements intact.     Conjunctiva/sclera: Conjunctivae normal.  Neck:      Vascular: No JVD.  Cardiovascular:     Rate and Rhythm: Normal rate and regular rhythm.     Heart sounds: No murmur heard. Pulmonary:     Effort: Pulmonary effort is normal. No tachypnea, accessory muscle usage or respiratory distress.     Breath sounds: Normal breath sounds. No decreased breath sounds, wheezing, rhonchi or rales.  Chest:     Chest wall: No tenderness.  Abdominal:     Palpations: Abdomen is soft.     Tenderness: There is no abdominal tenderness.  Musculoskeletal:        General: No swelling.     Cervical back: Normal range of motion and neck supple.     Right lower leg: No edema.     Left lower leg: No edema.  Skin:    General: Skin is warm and dry.     Capillary Refill: Capillary refill takes less than 2 seconds.     Coloration: Skin is not cyanotic or pale.  Neurological:     General: No focal deficit present.     Mental Status: She is alert and oriented to person, place, and time.     Cranial Nerves: No cranial nerve deficit.     Motor: No weakness.  Psychiatric:        Mood and Affect: Mood normal.        Behavior: Behavior normal.    ED Results / Procedures / Treatments   Labs (all labs ordered are listed, but only abnormal results are displayed) Labs Reviewed  CBC WITH DIFFERENTIAL/PLATELET - Abnormal; Notable for the following components:      Result Value   Platelets 142 (*)    Lymphs Abs 0.5 (*)    All other components within normal limits  D-DIMER, QUANTITATIVE - Abnormal; Notable for the following components:   D-Dimer, Quant 0.99 (*)    All other components within normal limits  SARS CORONAVIRUS 2 BY RT PCR  COMPREHENSIVE METABOLIC PANEL  MAGNESIUM  BRAIN NATRIURETIC PEPTIDE  I-STAT BETA HCG BLOOD, ED ([facility], [facility], [facility] ONLY)  TROPONIN I (HIGH SENSITIVITY)  TROPONIN I (HIGH SENSITIVITY)    EKG EKG Interpretation  Date/Time:  Monday Jun 15 2021 10:37:09 EDT Ventricular Rate:  110 PR Interval:  158 QRS Duration: 106 QT Interval:  313 QTC  Calculation: 424 R Axis:   26 Text Interpretation: Sinus tachycardia Low voltage, precordial leads Borderline T abnormalities, anterior leads Confirmed by Godfrey Pick ([id number]) on 06/15/2021 11:25:14 AM  Radiology DG Chest 2 View  Result Date: 06/15/2021 CLINICAL DATA:  52 year old female with history of cough and fever. Mid chest pain with shortness of breath. EXAM: CHEST - 2 VIEW COMPARISON:  Chest x-ray 07/14/2018. FINDINGS: Lung volumes are normal. No consolidative airspace disease. No pleural effusions. No pneumothorax. No evidence of pulmonary edema. Heart size  is moderately enlarged. Upper mediastinal contours are within normal limits. IMPRESSION: 1. Cardiomegaly, new compared to the prior study. Electronically Signed   By: Vinnie Langton M.D.   On: 06/15/2021 11:19   CT Angio Chest PE W and/or Wo Contrast  Result Date: 06/15/2021 CLINICAL DATA:  Pulmonary embolism (PE) suspected, positive D-dimer EXAM: CT ANGIOGRAPHY CHEST WITH CONTRAST TECHNIQUE: Multidetector CT imaging of the chest was performed using the standard protocol during bolus administration of intravenous contrast. Multiplanar CT image reconstructions and MIPs were obtained to evaluate the vascular anatomy. RADIATION DOSE REDUCTION: This exam was performed according to the departmental dose-optimization program which includes automated exposure control, adjustment of the mA and/or kV according to patient size and/or use of iterative reconstruction technique. CONTRAST:  157m OMNIPAQUE IOHEXOL 350 MG/ML SOLN COMPARISON:  Same day x-ray.  CT 11/03/2020 FINDINGS: Cardiovascular: Adequate opacification of the pulmonary arteries. No evidence of filling defect to the lobar branch level. Evaluation of the more distal pulmonary arterial levels is limited by respiratory motion artifact and beam hardening artifact related to body habitus. Thoracic aorta is nonaneurysmal. Heart size is mildly enlarged. No significant pericardial effusion.  Mediastinum/Nodes: No enlarged mediastinal, hilar, or axillary lymph nodes. Thyroid gland, trachea, and esophagus demonstrate no significant findings. Lungs/Pleura: Stable 4 mm pulmonary nodule at the left lung base (series 6, image 105). No additional pulmonary nodules. No airspace consolidation. No pleural effusion or pneumothorax. Upper Abdomen: Postsurgical changes of sleeve gastrectomy. No acute findings within the included upper abdomen. Musculoskeletal: No chest wall abnormality. No acute or significant osseous findings. Review of the MIP images confirms the above findings. IMPRESSION: 1. No evidence of pulmonary embolism to the lobar branch level. Evaluation of the more distal pulmonary arterial levels is limited by artifact. 2. Lungs are clear. Electronically Signed   By: NDavina PokeD.O.   On: 06/15/2021 14:03    Procedures Procedures    Medications Ordered in ED Medications  albuterol (VENTOLIN HFA) 108 (90 Base) MCG/ACT inhaler 2 puff (has no administration in time range)  ibuprofen (ADVIL) tablet 600 mg (600 mg Oral Given 06/15/21 1143)  lactated ringers bolus 1,000 mL (0 mLs Intravenous Stopped 06/15/21 1312)  sodium chloride (PF) 0.9 % injection (  Given by Other 06/15/21 1312)  iohexol (OMNIPAQUE) 350 MG/ML injection 100 mL (100 mLs Intravenous Contrast Given 06/15/21 1318)    ED Course/ Medical Decision Making/ A&P                           Medical Decision Making Amount and/or Complexity of Data Reviewed Labs: ordered. Radiology: ordered.  Risk OTC drugs. Prescription drug management.   This patient presents to the ED for concern of flulike symptoms, this involves an extensive number of treatment options, and is a complaint that carries with it a high risk of complications and morbidity.  The differential diagnosis includes viral URI, pneumonia, PE   Co morbidities that complicate the patient evaluation  anxiety, depression, GERD, HTN, hypothyroidism, sleep apnea,  atrial fibrillation, and sleeve gastrectomy in 2016   Additional history obtained:  Additional history obtained from patient's significant other External records from outside source obtained and reviewed including EMR   Lab Tests:  I Ordered, and personally interpreted labs.  The pertinent results include: Normal hemoglobin, low-normal WBC consistent with viral illness, normal troponin, normal BNP, elevated D-dimer   Imaging Studies ordered:  I ordered imaging studies including chest x-ray, CTA chest I independently visualized and  interpreted imaging which showed chest x-ray showed interval enlargement of cardiac silhouette when compared to chest x-ray from 3 years ago.  On CTA chest, there is no evidence of pericardial effusion, PE, or focal opacity in the lungs. I agree with the radiologist interpretation   Cardiac Monitoring: / EKG:  The patient was maintained on a cardiac monitor.  I personally viewed and interpreted the cardiac monitored which showed an underlying rhythm of: Sinus rhythm   Problem List / ED Course / Critical interventions / Medication management  Patient is a 51 year old female presenting for flulike symptoms over the past 3 days.  Due to the worsening symptoms, patient presents to urgent care this morning.  At urgent care, there was concern of hypoxia and patient was directed to the ED.  On arrival in the ED, patient has normal SPO2 on room air.  Her breathing is unlabored.  There is no evidence of wheeze or crackles on lung auscultation.  She is found to be febrile and ibuprofen was given for antipyresis.  She currently denies any nausea.  Patient was given IV fluids.  Diagnostic work-up was initiated.  On patient's lab work, she has normal electrolytes and no evidence of leukocytosis.  Her WBC is actually low-normal consistent with viral illness.  Her D-dimer was found to be mildly elevated and this did prompt a CTA of chest.  Prior to this, she underwent a chest  x-ray which did show increased size of cardiac silhouette when compared to x-rays from 3 years ago.  Given her elevated blood pressure in the ED, patient's cardiomegaly may be secondary to hypertrophy of untreated hypertension.  She also states that she was seen in the past for persistent tachycardia following COVID-19 infection.  Patient's troponins and BNP were normal which is reassuring.  I do not suspect that cardiomegaly is acute.  On CTA of chest, there was no evidence of PE, pericardial effusion, or pneumonia.  Patient had improved symptoms while in the ED.  During ambulation, patient was able to maintain normal SPO2 on room air.  She did request prescriptions for as needed cough medicine.  Patient is to follow-up with her primary care doctor to ensure resolution of symptoms and return to the ED if she does have any worsening.  Patient was discharged in good condition. I ordered medication including IV fluids for hydration; ibuprofen for antipyresis Reevaluation of the patient after these medicines showed that the patient improved I have reviewed the patients home medicines and have made adjustments as needed   Social Determinants of Health:  Has access to outpatient care        Final Clinical Impression(s) / ED Diagnoses Final diagnoses:  Viral upper respiratory tract infection    Rx / DC Orders ED Discharge Orders          Ordered    benzonatate (TESSALON) 100 MG capsule  3 times daily PRN        06/15/21 1448    HYDROcodone bit-homatropine (HYCODAN) 5-1.5 MG/5ML syrup  Every 6 hours PRN        06/15/21 1448    ondansetron (ZOFRAN-ODT) 4 MG disintegrating tablet  Every 8 hours PRN        06/15/21 1448              Godfrey Pick, MD 06/15/21 1715

## 2021-06-15 NOTE — Telephone Encounter (Signed)
P.A. WEGOVY

## 2021-06-15 NOTE — ED Provider Notes (Signed)
Patient presents urgent care today complaining of a 2-day history of cough and fever that began yesterday.  Patient reports negative home COVID test this morning.  Tmax unknown, patient states she took Tylenol Cold and flu overnight, last dose of Tylenol was about an hour prior to arrival.  Patient is a temp of 100.5 on arrival despite Tylenol.  Patient's oxygen saturation was 88% on arrival, patient was provided with a liter of oxygen via nasal cannula which increased her oxygenation to 93.  Oxygen was removed and patient gently drifted down to 91%.  Patient politely declined EMS transport to the ED for further evaluation and close monitoring of her respiratory status, possible viral testing for COVID-19 and other respiratory viruses.  Patient states she is here with her spouse.  Patient's spouse, Posey Pronto, was invited into the room.  She was agreeable to taking patient to [facility] emergency room now for further evaluation and monitoring of her respiratory status.  Patient's oxygen was 91% when she was disconnected and walked out of the clinic.   Lynden Oxford Scales, PA-C 06/15/21 1019

## 2021-06-15 NOTE — ED Notes (Signed)
Pt said she'd rather not have her BP checked again.

## 2021-06-15 NOTE — ED Notes (Signed)
Patient refused vital signs 

## 2021-06-15 NOTE — ED Triage Notes (Signed)
Pt states 2 days ago she began to have a cough and a fever that began yesterday. She states the at home Covid test was negative today.   Home interventions: tylenol cold and flu, and tylenol about an hour ago

## 2021-06-15 NOTE — ED Triage Notes (Signed)
Patient was seen at [facility] this AM with c/o cough and fever. Patient states she took Tylenol approx 1 1/2 hours ago. Temp in triage-102.0 Patient c/o a dry cough x 2 days. Patien thad a negative home Covid test.

## 2021-06-16 ENCOUNTER — Telehealth: Payer: Self-pay | Admitting: Family Medicine

## 2021-06-16 NOTE — Telephone Encounter (Signed)
Called and left a message for pt to return call. Pt was recent seen in the er. Pt advised to follow up with JCL.

## 2021-06-17 ENCOUNTER — Encounter: Payer: Self-pay | Admitting: Family Medicine

## 2021-06-19 ENCOUNTER — Encounter: Payer: Self-pay | Admitting: Family Medicine

## 2021-06-20 NOTE — Telephone Encounter (Signed)
Recv'd response Chart notes needed and this was faxed to plan for Prior authorization

## 2021-06-20 NOTE — Telephone Encounter (Signed)
    Recv'd response Chart notes needed and this was faxed to plan for Prior authorization

## 2021-06-21 ENCOUNTER — Encounter: Payer: Self-pay | Admitting: Family Medicine

## 2021-06-22 ENCOUNTER — Encounter: Payer: Self-pay | Admitting: Family Medicine

## 2021-06-22 MED ORDER — HYDROCODONE BIT-HOMATROP MBR 5-1.5 MG/5ML PO SOLN: 5.0000 mL | Freq: Four times a day (QID) | ORAL | 0 refills | Status: DC | PRN

## 2021-06-25 NOTE — Telephone Encounter (Signed)
[employer] denied Katherine Lewis stating Rx benefits handles coverage for this member.  Called plan t# [phone] and form printed and completed and faxed with office notes.

## 2021-06-26 NOTE — Telephone Encounter (Signed)
P.A.now approved til 01/25/22, left message for pt and also advised to call pharmacy to see if it's in stock and let us know where would like [facility] sent to.  Also sent mychart message

## 2021-06-28 ENCOUNTER — Other Ambulatory Visit: Payer: Self-pay | Admitting: Family Medicine

## 2021-06-28 DIAGNOSIS — F341 Dysthymic disorder: Secondary | ICD-10-CM

## 2021-06-29 NOTE — Telephone Encounter (Signed)
[facility] is requesting to fill pt lexapro. Please advise. [place]

## 2021-07-09 NOTE — Progress Notes (Unsigned)
Cardiology Office Note:    Date:  07/09/2021   ID:  Katherine Lewis, DOB Jan 29, 1970, MRN [id number]  PCP:  Denita Lung, MD   [facility] Providers Cardiologist:  Lenna Sciara, MD Referring MD: Denita Lung, MD   Chief Complaint/Reason for Referral: Cardiology follow-up  ASSESSMENT:    1. Atrial fibrillation, unspecified type ([place])   2. Essential hypertension   3. OSA (obstructive sleep apnea)     PLAN:    In order of problems listed above: 1.  Atrial fibrillation: Continue apixaban and diltiazem.  Follow up 1 year. 2.  Hypertension: 3.  Obstructive sleep apnea:         {Are you ordering a CV Procedure (e.g. stress test, cath, DCCV, TEE, etc)?   Press F2        :[id number]}   Dispo:  No follow-ups on file.      Medication Adjustments/Labs and Tests Ordered: Current medicines are reviewed at length with the patient today.  Concerns regarding medicines are outlined above.  The following changes have been made:  {PLAN; NO CHANGE:13088:s}   Labs/tests ordered: No orders of the defined types were placed in this encounter.   Medication Changes: No orders of the defined types were placed in this encounter.    Current medicines are reviewed at length with the patient today.  The patient {ACTIONS; HAS/DOES NOT HAVE:19233} concerns regarding medicines.   History of Present Illness:    FOCUSED PROBLEM LIST:   1.  Obstructive sleep apnea (per patient severe) 2.  Hyperlipidemia 3.  Hypertension 4.  Palpitations; Kardia monitor demonstrated 30 seconds of atrial fibrillation; Zio monitor 11/22 with atrial fibrillation, CHADS2-VASC score 2 5.  GERD 6.  Hypothyroidism  The patient is a 51 y.o. female with the indicated medical history here for follow-up.  October 2022: Initial consultation for palpitations.  She had had a monitor on which demonstrated atrial fibrillation.  This was confirmed by monitor ordered here.  December 2022: She had found out that she  was diagnosed with severe sleep apnea.  No significant changes to her medical regimen were made.  Today: In the interim the patient was seen in the emergency department for shortness of breath CT scan, cardiac biomarkers, and BNP were all reassuring.     Current Medications: No outpatient medications have been marked as taking for the 07/10/21 encounter (Appointment) with Early Osmond, MD.     Allergies:    Codeine   Social History:   Social History   Tobacco Use   Smoking status: Former    Packs/day: 0.50    Years: 6.00    Total pack years: 3.00    Types: Cigarettes    Quit date: 01/19/1992    Years since quitting: 29.4   Smokeless tobacco: Never   Tobacco comments:    social   Vaping Use   Vaping Use: Never used  Substance Use Topics   Alcohol use: Yes    Comment: social    Drug use: No     Family Hx: Family History  Problem Relation Age of Onset   Diabetes type II Mother    Diabetes Mother    Lung cancer Paternal Grandmother    Lung cancer Paternal Grandfather      Review of Systems:   Please see the history of present illness.    All other systems reviewed and are negative.     EKGs/Labs/Other Test Reviewed:    EKG:  EKG performed May 2023 that I  personally reviewed demonstrates sinus tachycardia; EKG performed today that I personally reviewed demonstrates ***.  Prior CV studies: Coronary CTA 2022 with calcium score of 0  Monitor 2022 with low burden of atrial fibrillation  TTE 2022 with ejection fraction of 60 to 65% with mild left ventricular hypertrophy no significant valvular abnormalities  Other studies Reviewed: Review of the additional studies/records demonstrates: Chest CT without pulmonary embolism and no aortic atherosclerosis May 2023  Recent Labs: 10/02/2020: TSH 1.990 06/15/2021: ALT 23; B Natriuretic Peptide 50.2; BUN 8; Creatinine, Ser 0.83; Hemoglobin 13.9; Magnesium 2.0; Platelets 142; Potassium 3.9; Sodium 137   Recent Lipid  Panel Lab Results  Component Value Date/Time   CHOL 208 (H) 02/02/2021 09:09 AM   TRIG 165 (H) 02/02/2021 09:09 AM   HDL 43 02/02/2021 09:09 AM   LDLCALC 135 (H) 02/02/2021 09:09 AM    Risk Assessment/Calculations:    {Does this patient have ATRIAL FIBRILLATION?:[phone]}      Physical Exam:    VS:  There were no vitals taken for this visit.   Wt Readings from Last 3 Encounters:  06/15/21 266 lb (120.7 kg)  05/25/21 270 lb (122.5 kg)  05/18/21 269 lb 3.2 oz (122.1 kg)    GENERAL:  No apparent distress, AOx3 HEENT:  No carotid bruits, +2 carotid impulses, no scleral icterus CAR: RRR Irregular RR*** no murmurs***, gallops, rubs, or thrills RES:  Clear to auscultation bilaterally ABD:  Soft, nontender, nondistended, positive bowel sounds x 4 VASC:  +2 radial pulses, +2 carotid pulses, palpable pedal pulses NEURO:  CN 2-12 grossly intact; motor and sensory grossly intact PSYCH:  No active depression or anxiety EXT:  No edema, ecchymosis, or cyanosis  Signed, Early Osmond, MD  07/09/2021 3:35 PM    [facility] [address], [place], [place]  [address] Phone: [phone]; Fax: [phone]   Note:  This document was prepared using Dragon voice recognition software and may include unintentional dictation errors.

## 2021-07-10 ENCOUNTER — Ambulatory Visit: Payer: No Typology Code available for payment source | Admitting: Internal Medicine

## 2021-07-10 ENCOUNTER — Encounter: Payer: Self-pay | Admitting: Internal Medicine

## 2021-07-10 VITALS — BP 110/60 | HR 89 | Ht 65.0 in | Wt 262.0 lb

## 2021-07-10 DIAGNOSIS — Z6841 Body Mass Index (BMI) 40.0 and over, adult: Secondary | ICD-10-CM

## 2021-07-10 DIAGNOSIS — I4891 Unspecified atrial fibrillation: Secondary | ICD-10-CM

## 2021-07-10 DIAGNOSIS — G4733 Obstructive sleep apnea (adult) (pediatric): Secondary | ICD-10-CM | POA: Diagnosis not present

## 2021-07-10 DIAGNOSIS — I10 Essential (primary) hypertension: Secondary | ICD-10-CM

## 2021-07-22 ENCOUNTER — Other Ambulatory Visit: Payer: Self-pay | Admitting: Family Medicine

## 2021-07-22 MED ORDER — WEGOVY 1 MG/0.5ML ~~LOC~~ SOAJ
1.0000 mg | SUBCUTANEOUS | 0 refills | Status: DC
Start: 2021-07-22 — End: 2021-07-30

## 2021-07-30 ENCOUNTER — Other Ambulatory Visit (HOSPITAL_COMMUNITY): Payer: Self-pay

## 2021-07-30 ENCOUNTER — Encounter: Payer: Self-pay | Admitting: Family Medicine

## 2021-07-30 MED ORDER — WEGOVY 1 MG/0.5ML ~~LOC~~ SOAJ
1.0000 mg | SUBCUTANEOUS | 0 refills | Status: DC
  Filled 2021-07-30: qty 2, 28d supply, fill #0

## 2021-07-31 ENCOUNTER — Other Ambulatory Visit (HOSPITAL_COMMUNITY): Payer: Self-pay

## 2021-08-01 ENCOUNTER — Other Ambulatory Visit: Payer: Self-pay | Admitting: Family Medicine

## 2021-08-01 DIAGNOSIS — I10 Essential (primary) hypertension: Secondary | ICD-10-CM

## 2021-08-24 ENCOUNTER — Other Ambulatory Visit (HOSPITAL_COMMUNITY): Payer: Self-pay

## 2021-08-24 ENCOUNTER — Other Ambulatory Visit: Payer: Self-pay | Admitting: Family Medicine

## 2021-08-24 MED ORDER — WEGOVY 1 MG/0.5ML ~~LOC~~ SOAJ
1.0000 mg | SUBCUTANEOUS | 0 refills | Status: DC
Start: 2021-08-24 — End: 2021-09-27
  Filled 2021-09-01: qty 2, 28d supply, fill #0

## 2021-09-01 ENCOUNTER — Other Ambulatory Visit (HOSPITAL_COMMUNITY): Payer: Self-pay

## 2021-09-23 ENCOUNTER — Encounter: Payer: Self-pay | Admitting: Internal Medicine

## 2021-09-27 ENCOUNTER — Other Ambulatory Visit: Payer: Self-pay | Admitting: Family Medicine

## 2021-09-28 NOTE — Telephone Encounter (Signed)
[facility] is requesting to fill pt wegovy. Please advise KH 

## 2021-09-29 ENCOUNTER — Other Ambulatory Visit (HOSPITAL_COMMUNITY): Payer: Self-pay

## 2021-09-29 MED ORDER — WEGOVY 1 MG/0.5ML ~~LOC~~ SOAJ
1.0000 mg | SUBCUTANEOUS | 0 refills | Status: DC
  Filled 2021-09-29: qty 2, 28d supply, fill #0

## 2021-09-29 NOTE — Telephone Encounter (Signed)
Appt has been made and pt is down 8 lbs as of today. KH

## 2021-09-30 ENCOUNTER — Other Ambulatory Visit (HOSPITAL_COMMUNITY): Payer: Self-pay

## 2021-10-05 ENCOUNTER — Other Ambulatory Visit: Payer: Self-pay | Admitting: Family Medicine

## 2021-10-05 DIAGNOSIS — F341 Dysthymic disorder: Secondary | ICD-10-CM

## 2021-10-05 NOTE — Telephone Encounter (Signed)
[facility] is requesting to fill pt lexapro. Please advise Kh

## 2021-10-06 ENCOUNTER — Telehealth (INDEPENDENT_AMBULATORY_CARE_PROVIDER_SITE_OTHER): Payer: No Typology Code available for payment source | Admitting: Family Medicine

## 2021-10-06 ENCOUNTER — Encounter: Payer: Self-pay | Admitting: Family Medicine

## 2021-10-06 VITALS — Ht 65.0 in | Wt 255.0 lb

## 2021-10-06 DIAGNOSIS — E669 Obesity, unspecified: Secondary | ICD-10-CM

## 2021-10-06 DIAGNOSIS — F341 Dysthymic disorder: Secondary | ICD-10-CM | POA: Diagnosis not present

## 2021-10-06 NOTE — Progress Notes (Signed)
   Subjective:    Patient ID: Katherine Lewis, female    DOB: 1970/09/01, 51 y.o.   MRN: [id number]  HPI Documentation for virtual audio and video telecommunications through [place] encounter:  The patient was located at home. 2 patient identifiers used.  The provider was located in the office. The patient did consent to this visit and is aware of possible charges through their insurance for this visit.  The other persons participating in this telemedicine service were none. Time spent on call was 5 minutes and in review of previous records >15 minutes total for counseling and coordination of care.  This virtual service is not related to other E/M service within previous 7 days.  She has been on [facility] for a little over 3 months and has lost 7 pounds on her scales.  She states that since starting on Wegovy she has had abdominal pain and diarrhea.  She presently is on 1 mg.  She continues on Lexapro and seems be doing nicely on that medication.  She recently got the flu and the [place] shot.  Review of Systems     Objective:   Physical Exam Alert and in no distress otherwise not examined       Assessment & Plan:  Obesity (BMI 35.0-39.9 without comorbidity)  Dysthymia I explained that the abdominal pain and diarrhea are certainly concerning and would be a reason to stop the medication but she says that she would like to stay on a little bit longer to see if she has continued weight loss.  If she does not then she will stop the medication.  Definitely continue on Lexapro.  She will keep me informed.

## 2021-10-26 ENCOUNTER — Other Ambulatory Visit (HOSPITAL_COMMUNITY): Payer: Self-pay

## 2021-10-26 ENCOUNTER — Other Ambulatory Visit: Payer: Self-pay | Admitting: Family Medicine

## 2021-10-26 DIAGNOSIS — I10 Essential (primary) hypertension: Secondary | ICD-10-CM

## 2021-10-26 MED ORDER — WEGOVY 1 MG/0.5ML ~~LOC~~ SOAJ
1.0000 mg | SUBCUTANEOUS | 0 refills | Status: DC
  Filled 2021-10-26: qty 2, 28d supply, fill #0

## 2021-10-26 NOTE — Telephone Encounter (Signed)
[facility] is requesting to fill pt wegovy. Please advise KH 

## 2021-10-28 ENCOUNTER — Other Ambulatory Visit (HOSPITAL_COMMUNITY): Payer: Self-pay

## 2021-11-09 ENCOUNTER — Encounter: Payer: Self-pay | Admitting: Internal Medicine

## 2021-11-23 ENCOUNTER — Other Ambulatory Visit: Payer: Self-pay | Admitting: Internal Medicine

## 2021-11-23 DIAGNOSIS — I4891 Unspecified atrial fibrillation: Secondary | ICD-10-CM

## 2021-11-24 NOTE — Telephone Encounter (Signed)
Eliquis 5mg  refill request received. Patient is 51 years old, weight-115.7kg, Crea-0.83 on 06/15/2021, Diagnosis-Afib, and last seen by Dr. Ali Lowe on 07/10/2021. Dose is appropriate based on dosing criteria. Will send in refill to requested pharmacy.

## 2021-11-26 ENCOUNTER — Encounter: Payer: Self-pay | Admitting: Family Medicine

## 2021-12-24 ENCOUNTER — Other Ambulatory Visit: Payer: Self-pay | Admitting: Family Medicine

## 2022-01-28 ENCOUNTER — Encounter: Payer: Self-pay | Admitting: Internal Medicine

## 2022-01-29 NOTE — Telephone Encounter (Signed)
Called patient and made her an appointment with Nicholes Rough PA on 02/05/22 at 3:35 pm.

## 2022-02-01 ENCOUNTER — Other Ambulatory Visit: Payer: Self-pay | Admitting: Family Medicine

## 2022-02-01 DIAGNOSIS — I10 Essential (primary) hypertension: Secondary | ICD-10-CM

## 2022-02-05 ENCOUNTER — Ambulatory Visit: Payer: No Typology Code available for payment source | Attending: Physician Assistant | Admitting: Physician Assistant

## 2022-02-05 VITALS — BP 122/70 | HR 69 | Ht 65.0 in | Wt 267.6 lb

## 2022-02-05 DIAGNOSIS — I10 Essential (primary) hypertension: Secondary | ICD-10-CM

## 2022-02-05 DIAGNOSIS — R0609 Other forms of dyspnea: Secondary | ICD-10-CM | POA: Diagnosis not present

## 2022-02-05 DIAGNOSIS — R002 Palpitations: Secondary | ICD-10-CM

## 2022-02-05 DIAGNOSIS — G4733 Obstructive sleep apnea (adult) (pediatric): Secondary | ICD-10-CM

## 2022-02-05 DIAGNOSIS — I4891 Unspecified atrial fibrillation: Secondary | ICD-10-CM

## 2022-02-05 DIAGNOSIS — R0602 Shortness of breath: Secondary | ICD-10-CM

## 2022-02-05 DIAGNOSIS — G4730 Sleep apnea, unspecified: Secondary | ICD-10-CM

## 2022-02-05 MED ORDER — DILTIAZEM HCL ER COATED BEADS 180 MG PO CP24: 180.0000 mg | ORAL_CAPSULE | Freq: Every day | ORAL | 3 refills | Status: DC

## 2022-02-05 NOTE — Progress Notes (Signed)
Office Visit    Patient Name: Katherine Lewis Date of Encounter: 02/05/2022  PCP:  Ronnald Nian, MD   [facility]  Cardiologist:  Orbie Pyo, MD  Advanced Practice Provider:  No care team member to display Electrophysiologist:  None   HPI    Katherine Lewis is a 52 y.o. female with hypertension, atrial fibrillation, OSA, and obesity presents today for follow-up appointment  She was seen by Dr. Dorothey Baseman 06/2021 and at that time was well-controlled on statins also has a for atrial fibrillation.  Blood pressure was also well-controlled.  She was potentially needing titration of her CPAP device.  Otherwise, was feeling well.  She presents today for complaint of irregular heartbeat and shortness she states the other night she was in atrial fibrillation for 2 hours straight.  She also is having more frequent bouts.  She also describes like fluttering in her chest.  She uses a Kardia device and usually uses normal sinus rhythm.  She also has subsided.  She states the fluttering in her chest causes cough.  She also needed a titration of her CPAP device.  She does state that she drank more alcohol over the holidays which may have perpetuated these instances.  She stopped drinking She does feel like this has helped.  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 and has to walk about a mile in the mornings.  This reason she is asked for today handicap placard.  She states that she has to make several stops before getting to her office and she is even had to schedule some virtual visits.  Reports no chest pain, pressure, or tightness. No edema, orthopnea, PND.   Past Medical History    Past Medical History:  Diagnosis Date   Anxiety    Depression    GERD (gastroesophageal reflux disease)    Hypertension    Hypothyroidism    Insomnia    Plantar fasciitis    Sleep apnea     does not wear Cpap   Vitamin D deficiency    Past Surgical History:  Procedure Laterality Date   LAPAROSCOPIC GASTRIC SLEEVE RESECTION N/A 04/23/2014   Procedure: LAPAROSCOPIC GASTRIC SLEEVE RESECTION WITH UPPER ENDOSCOPY;  Surgeon: Luretha Murphy, MD;  Location: [facility];  Service: General;  Laterality: N/A;   MYRINGOTOMY     bilateral    TOTAL KNEE ARTHROPLASTY Right 01/02/2019   Procedure: RIGHT TOTAL KNEE ARTHROPLASTY;  Surgeon: Cammy Copa, MD;  Location: [facility] OR;  Service: Orthopedics;  Laterality: Right;    Allergies  Allergies  Allergen Reactions   Codeine Nausea Only    EKGs/Labs/Other Studies Reviewed:   The following studies were reviewed today:  Echocardiogram 12/08/20 IMPRESSIONS     1. Left ventricular ejection fraction, by estimation, is 60 to 65%. The  left ventricle has normal function. The left ventricle has no regional  wall motion abnormalities. There is mild left ventricular hypertrophy.  Left ventricular diastolic parameters  were normal. The average left ventricular global longitudinal strain is  -18.9 %. The global longitudinal strain is normal.   2. Right ventricular systolic function is normal. The right ventricular  size is normal.   3. Left atrial size was mildly dilated.   4. A small pericardial effusion is present. The pericardial effusion is  posterior to the left ventricle.   5. The mitral valve is normal in  structure. No evidence of mitral valve  regurgitation. No evidence of mitral stenosis.   6. The aortic valve is tricuspid. Aortic valve regurgitation is not  visualized. No aortic stenosis is present.   7. The inferior vena cava is normal in size with greater than 50%  respiratory variability, suggesting right atrial pressure of 3 mmHg.   FINDINGS   Left Ventricle: Left ventricular ejection fraction, by estimation, is 60  to 65%. The left ventricle has normal function. The left ventricle has no  regional wall motion abnormalities.  The average left ventricular global  longitudinal strain is -18.9 %.  The global longitudinal strain is normal. The left ventricular internal  cavity size was normal in size. There is mild left ventricular  hypertrophy. Left ventricular diastolic parameters were normal.   Right Ventricle: The right ventricular size is normal. No increase in  right ventricular wall thickness. Right ventricular systolic function is  normal.   Left Atrium: Left atrial size was mildly dilated.   Right Atrium: Right atrial size was normal in size.   Pericardium: A small pericardial effusion is present. The pericardial  effusion is posterior to the left ventricle.   Mitral Valve: The mitral valve is normal in structure. No evidence of  mitral valve regurgitation. No evidence of mitral valve stenosis.   Tricuspid Valve: The tricuspid valve is normal in structure. Tricuspid  valve regurgitation is not demonstrated. No evidence of tricuspid  stenosis.   Aortic Valve: The aortic valve is tricuspid. Aortic valve regurgitation is  not visualized. No aortic stenosis is present.   Pulmonic Valve: The pulmonic valve was normal in structure. Pulmonic valve  regurgitation is not visualized. No evidence of pulmonic stenosis.   Aorta: The aortic root is normal in size and structure.   Venous: The inferior vena cava is normal in size with greater than 50%  respiratory variability, suggesting right atrial pressure of 3 mmHg.   IAS/Shunts: No atrial level shunt detected by color flow Doppler.   EKG:  EKG is not ordered today.   Recent Labs: 06/15/2021: ALT 23; B Natriuretic Peptide 50.2; BUN 8; Creatinine, Ser 0.83; Hemoglobin 13.9; Magnesium 2.0; Platelets 142; Potassium 3.9; Sodium 137  Recent Lipid Panel    Component Value Date/Time   CHOL 208 (H) 02/02/2021 0909   TRIG 165 (H) 02/02/2021 0909   HDL 43 02/02/2021 0909   CHOLHDL 4.8 (H) 02/02/2021 0909   LDLCALC 135 (H) 02/02/2021 0909    Risk  Assessment/Calculations:   CHA2DS2-VASc Score = 2   This indicates a 2.2% annual risk of stroke. The patient's score is based upon: CHF History: 0 HTN History: 1 Diabetes History: 0 Stroke History: 0 Vascular Disease History: 0 Age Score: 0 Gender Score: 1      Home Medications   Current Meds  Medication Sig   apixaban (ELIQUIS) 5 MG TABS tablet TAKE ONE TABLET BY MOUTH TWICE DAILY   diltiazem (CARDIZEM CD) 180 MG 24 hr capsule Take 1 capsule (180 mg total) by mouth daily.   escitalopram (LEXAPRO) 5 MG tablet TAKE ONE TABLET BY MOUTH ONCE DAILY   levonorgestrel (MIRENA) 20 MCG/24HR IUD 1 each by Intrauterine route once. Reported on 06/23/2015   losartan-hydrochlorothiazide (HYZAAR) 50-12.5 MG tablet TAKE 1/2 TABLET ONCE DAILY   pantoprazole (PROTONIX) 40 MG tablet TAKE ONE TABLET BY MOUTH ONCE DAILY   RESTASIS 0.05 % ophthalmic emulsion Place 1 drop into both eyes 2 (two) times daily.   [DISCONTINUED] diltiazem (CARDIZEM CD) 120 MG 24  hr capsule TAKE ONE CAPSULE BY MOUTH ONCE DAILY     Review of Systems      All other systems reviewed and are otherwise negative except as noted above.  Physical Exam    VS:  BP 122/70   Pulse 69   Ht 5\' 5"  (1.651 m)   Wt 267 lb 9.6 oz (121.4 kg)   SpO2 97%   BMI 44.53 kg/m  , BMI Body mass index is 44.53 kg/m.  Wt Readings from Last 3 Encounters:  02/05/22 267 lb 9.6 oz (121.4 kg)  10/06/21 255 lb (115.7 kg)  07/10/21 262 lb (118.8 kg)     GEN: Well nourished, well developed, in no acute distress. HEENT: normal. Neck: Supple, no JVD, carotid bruits, or masses. Cardiac: RRR, no murmurs, rubs, or gallops. No clubbing, cyanosis, edema.  Radials/PT 2+ and equal bilaterally.  Respiratory:  Respirations regular and unlabored, clear to auscultation bilaterally. GI: Soft, nontender, nondistended. MS: No deformity or atrophy. Skin: Warm and dry, no rash. Neuro:  Strength and sensation are intact. Psych: Normal affect.  Assessment &  Plan    Atrial fibrillation -More frequent episodes with episodes lasting a longer amount of time.  We will increase her diltiazem today and stop her losartan. -Limit alcohol and caffeine use -Maintain hydration at 64 ounces of water daily -Continue to use Kardia mobile device to spot check rhythm -Will order an echocardiogram  Hypertension -well controlled today - for fear of hypotension we will plan to stop losartan/HCTZ and increase diltiazem to 280 mg daily  OSA -May need some potential CPAP titration. -Will schedule her some follow-up with Dr. Radford Pax  Shortness of breath -Mostly exertional -Will order updated echo to evaluate LVEF      Disposition: Follow up 4 weeks with Early Osmond, MD or APP.  Signed, Elgie Collard, PA-C 02/05/2022, 4:21 PM [facility]

## 2022-02-05 NOTE — Patient Instructions (Signed)
Medication Instructions:  1.Increase diltiazem to 180 mg daily 2.Hold losartan-hydrochlorothiazide for now *If you need a refill on your cardiac medications before your next appointment, please call your pharmacy*   Lab Work: None ordered If you have labs (blood work) drawn today and your tests are completely normal, you will receive your results only by: [place] (if you have MyChart) OR A paper copy in the mail If you have any lab test that is abnormal or we need to change your treatment, we will call you to review the results.   Testing/Procedures: Your physician has requested that you have an echocardiogram. Echocardiography is a painless test that uses sound waves to create images of your heart. It provides your doctor with information about the size and shape of your heart and how well your heart's chambers and valves are working. This procedure takes approximately one hour. There are no restrictions for this procedure. Please do NOT wear cologne, perfume, aftershave, or lotions (deodorant is allowed). Please arrive 15 minutes prior to your appointment time.    Follow-Up: At [facility], you and your health needs are our priority.  As part of our continuing mission to provide you with exceptional heart care, we have created designated Provider Care Teams.  These Care Teams include your primary Cardiologist (physician) and Advanced Practice Providers (APPs -  Physician Assistants and Nurse Practitioners) who all work together to provide you with the care you need, when you need it.   Your next appointment:   1 month(s)  Provider:   Early Osmond, MD  or APP

## 2022-02-22 ENCOUNTER — Emergency Department (HOSPITAL_BASED_OUTPATIENT_CLINIC_OR_DEPARTMENT_OTHER)
Admission: EM | Admit: 2022-02-22 | Discharge: 2022-02-22 | Disposition: A | Discharge status: Home / Self Care | Payer: BC Managed Care – PPO | Attending: Emergency Medicine | Admitting: Emergency Medicine

## 2022-02-22 ENCOUNTER — Other Ambulatory Visit: Payer: Self-pay

## 2022-02-22 ENCOUNTER — Encounter (HOSPITAL_BASED_OUTPATIENT_CLINIC_OR_DEPARTMENT_OTHER): Payer: Self-pay | Admitting: Emergency Medicine

## 2022-02-22 DIAGNOSIS — I10 Essential (primary) hypertension: Secondary | ICD-10-CM | POA: Diagnosis not present

## 2022-02-22 DIAGNOSIS — Z79899 Other long term (current) drug therapy: Secondary | ICD-10-CM | POA: Insufficient documentation

## 2022-02-22 DIAGNOSIS — M545 Low back pain, unspecified: Secondary | ICD-10-CM | POA: Insufficient documentation

## 2022-02-22 DIAGNOSIS — Z7901 Long term (current) use of anticoagulants: Secondary | ICD-10-CM | POA: Insufficient documentation

## 2022-02-22 LAB — URINALYSIS, W/ REFLEX TO CULTURE (INFECTION SUSPECTED)
Bilirubin Urine: NEGATIVE
Glucose, UA: NEGATIVE mg/dL
Hgb urine dipstick: NEGATIVE
Ketones, ur: NEGATIVE mg/dL
Leukocytes,Ua: NEGATIVE
Nitrite: NEGATIVE
Protein, ur: NEGATIVE mg/dL
Specific Gravity, Urine: <1.005 — ABNORMAL LOW (ref 1.005–1.030)
pH: 6 (ref 5.0–8.0)

## 2022-02-22 MED ORDER — CYCLOBENZAPRINE HCL 10 MG PO TABS: 10.0000 mg | ORAL_TABLET | Freq: Three times a day (TID) | ORAL | 0 refills | Status: DC | PRN

## 2022-02-22 MED ORDER — ACETAMINOPHEN 500 MG PO TABS
1000.0000 mg | ORAL_TABLET | Freq: Four times a day (QID) | ORAL | Status: DC | PRN
  Administered 2022-02-22: 1000 mg via ORAL
  Filled 2022-02-22: qty 2

## 2022-02-22 MED ORDER — LIDOCAINE 5 % EX PTCH
1.0000 | MEDICATED_PATCH | CUTANEOUS | Status: DC
  Administered 2022-02-22: 1 via TRANSDERMAL
  Filled 2022-02-22: qty 1

## 2022-02-22 MED ORDER — LIDOCAINE 5 % EX PTCH: 1.0000 | MEDICATED_PATCH | Freq: Two times a day (BID) | CUTANEOUS | 0 refills | Status: DC

## 2022-02-22 MED ORDER — CYCLOBENZAPRINE HCL 10 MG PO TABS
10.0000 mg | ORAL_TABLET | Freq: Once | ORAL | Status: AC
  Administered 2022-02-22: 10 mg via ORAL
  Filled 2022-02-22: qty 1

## 2022-02-22 NOTE — ED Provider Notes (Signed)
[facility] Provider Note   CSN: [id number] Arrival date & time: 02/22/22  1191     History  Chief Complaint  Patient presents with   Back Pain    Katherine Lewis is a 52 y.o. female with PMH HTN, A-fib on Eliquis, obesity status post gastric sleeve, OSA presenting with approximately 3 days of worsening lower back pain.  She said her back pain started on Friday with no preceding events, exertion, or trauma.  Her pain is localized to the right lower lumbosacral paraspinal area without radiation or sciatic pain.  She says that her pain is better when she is standing or moving around, but that she does feel numbness on the lateral sides of both legs when she stands for too long.  She does have chronic back pain and is previously tried PT with some relief and typically takes Tylenol for her pain but states that Tylenol does not help this time.  She additionally endorses urinary frequency, but states that this is chronic.  She denies any fevers or chills, new saddle paresthesias, bowel or bladder incontinence, dysuria, abdominal pain nausea vomiting or diarrhea.       Home Medications Prior to Admission medications   Medication Sig Start Date End Date Taking? Authorizing Provider  cyclobenzaprine (FLEXERIL) 10 MG tablet Take 1 tablet (10 mg total) by mouth 3 (three) times daily as needed for muscle spasms. 02/22/22  Yes Linus Galas, MD  lidocaine (LIDODERM) 5 % Place 1 patch onto the skin every 12 (twelve) hours. Remove & Discard patch within 12 hours or as directed by MD 02/22/22 02/22/23 Yes Linus Galas, MD  apixaban (ELIQUIS) 5 MG TABS tablet TAKE ONE TABLET BY MOUTH TWICE DAILY 11/24/21   Early Osmond, MD  diltiazem (CARDIZEM CD) 180 MG 24 hr capsule Take 1 capsule (180 mg total) by mouth daily. 02/05/22   Elgie Collard, PA-C  escitalopram (LEXAPRO) 5 MG tablet TAKE ONE TABLET BY MOUTH ONCE DAILY 10/06/21   Denita Lung, MD   levonorgestrel (MIRENA) 20 MCG/24HR IUD 1 each by Intrauterine route once. Reported on 06/23/2015    [provider]  losartan-hydrochlorothiazide [facility]) 50-12.5 MG tablet TAKE 1/2 TABLET ONCE DAILY 02/01/22   Denita Lung, MD  pantoprazole (PROTONIX) 40 MG tablet TAKE ONE TABLET BY MOUTH ONCE DAILY 12/24/21   Denita Lung, MD  RESTASIS 0.05 % ophthalmic emulsion Place 1 drop into both eyes 2 (two) times daily. 02/01/22   [provider]      Allergies    Codeine    Review of Systems   See HPI  Physical Exam Updated Vital Signs BP (!) 148/85 (BP Location: Left Arm)   Pulse (!) 57   Temp 98.7 F (37.1 C) (Temporal)   Resp 16   Ht 5\' 5"  (1.651 m)   Wt 121.4 kg   SpO2 98%   BMI 44.53 kg/m  Physical Exam Constitutional:      General: She is not in acute distress.    Appearance: Normal appearance.  Cardiovascular:     Rate and Rhythm: Normal rate and regular rhythm.     Pulses: Normal pulses.     Heart sounds: Normal heart sounds.  Pulmonary:     Effort: Pulmonary effort is normal.     Breath sounds: Normal breath sounds.  Abdominal:     General: There is no distension.     Palpations: Abdomen is soft.     Tenderness: There  is no abdominal tenderness.  Musculoskeletal:        General: Tenderness (Tenderness over right lumbar paraspinal muscles) present. Normal range of motion.     Cervical back: Normal range of motion. No rigidity.     Comments: Negative straight leg raise bilaterally.  No point tenderness over spine.  Skin:    General: Skin is warm and dry.     Findings: No rash.  Neurological:     General: No focal deficit present.     Mental Status: She is alert and oriented to person, place, and time.     Cranial Nerves: No cranial nerve deficit.     Sensory: No sensory deficit.     Motor: No weakness.     ED Results / Procedures / Treatments   Labs (all labs ordered are listed, but only abnormal results are displayed) Labs Reviewed   URINALYSIS, W/ REFLEX TO CULTURE (INFECTION SUSPECTED) - Abnormal; Notable for the following components:      Result Value   Color, Urine COLORLESS (*)    Specific Gravity, Urine <1.005 (*)    Bacteria, UA RARE (*)    All other components within normal limits    EKG None  Radiology No results found.    Medications Ordered in ED Medications  lidocaine (LIDODERM) 5 % 1 patch (1 patch Transdermal Patch Applied 02/22/22 0958)  acetaminophen (TYLENOL) tablet 1,000 mg (1,000 mg Oral Given 02/22/22 0958)  cyclobenzaprine (FLEXERIL) tablet 10 mg (10 mg Oral Given 02/22/22 3235)    ED Course/ Medical Decision Making/ A&P                             Medical Decision Making Risk OTC drugs. Prescription drug management.  Katherine Lewis is a 52 y.o. female with PMH HTN, A-fib on Eliquis, obesity status post gastric sleeve, OSA presenting with approximately 3 days of worsening lower back pain.  Her back pain started on Friday without any clear precipitating events.  Sounds musculoskeletal in nature without associated sciatica or red flag symptoms.  She does have increased urinary frequency, but this is likely chronic.  No intra-abdominal symptoms, low concern for retroperitoneal hemorrhage or intra-abdominal process.  Will go ahead and test urine to rule out UTI or pyelonephritis, otherwise we will treat as lumbar muscle strain with Flexeril, lidocaine patch, Tylenol and outpatient follow-up.  Update: Urine labs are negative.  She feels significantly better after dose of Flexeril and lidocaine patch.  Is comfortable discharge today and discussed appropriate treatment for musculoskeletal back pain including conservative pain management, Flexeril as needed, stretching exercises.  She can follow-up with PCP as needed.  Provided return precautions in case she develops new concerning symptoms.  Final Clinical Impression(s) / ED Diagnoses Final diagnoses:  Lumbar back pain    Rx / DC Orders ED  Discharge Orders          Ordered    cyclobenzaprine (FLEXERIL) 10 MG tablet  3 times daily PRN        02/22/22 1138    lidocaine (LIDODERM) 5 %  Every 12 hours        02/22/22 1140              Linus Galas, MD 02/22/22 1144    Tegeler, Gwenyth Allegra, MD 02/23/22 1540

## 2022-02-22 NOTE — ED Notes (Signed)
Pt given discharge instructions and reviewed prescriptions. Opportunities given for questions. Pt verbalizes understanding. Leanne Chang, RN

## 2022-02-22 NOTE — ED Triage Notes (Signed)
Pt arrives to ED with c/o lower right sided back pain since 2/1. The pain has progressively gotten worse.

## 2022-02-22 NOTE — Discharge Instructions (Addendum)
Urine and urine test were negative.  As discussed, I think your back pain is likely musculoskeletal in nature.  You can take Flexeril up to 3 times a day as needed for muscle spasms and also use 1 lidocaine patch today as needed.  Otherwise, I would take Tylenol extra strength every 6 hours for the pain.  Please try to keep as mobile as safely possible and stretch out your back as well.  Keep in mind that Flexeril will make you drowsy, so I would avoid driving after you take a dose.  Please come back to the ED if you develop new symptoms like burning when you pee, blood in your urine, nausea vomiting or diarrhea, fevers or chills, or new weakness or numbness anywhere in your body.

## 2022-02-23 ENCOUNTER — Telehealth: Payer: Self-pay | Admitting: Family Medicine

## 2022-02-23 NOTE — Telephone Encounter (Signed)
  Transition Care Management Unsuccessful Follow-up Telephone Call  Date of discharge and from where:  02/22/2022 [facility]  Attempts:  1st Attempt  Reason for unsuccessful TCM follow-up call:  Left voice message   Message #1

## 2022-02-25 ENCOUNTER — Telehealth: Payer: Self-pay | Admitting: Family Medicine

## 2022-02-25 NOTE — Telephone Encounter (Signed)
Transition Care Management Follow-up Telephone Call Date of discharge and from where: 02/22/2022 [facility] How have you been since you were released from the hospital? Much better Any questions or concerns? No  Items Reviewed: Did the pt receive and understand the discharge instructions provided? Yes  Medications obtained and verified? Yes pt received 10 lidocaine patches and a rx for flexeril she did picks those up and has been using  Other? No  Any new allergies since your discharge? No  Dietary orders reviewed? No Do you have support at home? Yes   Home Care and Equipment/Supplies: Were home health services ordered? not applicable Follow up appointments reviewed:  PCP Hospital f/u appt confirmed? No  pt declines at this time. She did mention PT and states that she doesn't really have time for that.  I advised that if she changes her mind to call the office I can set her up to see JCL to get that set up If their condition worsens, is the pt aware to call PCP or go to the Emergency Dept.? Yes Was the patient provided with contact information for the PCP's office or ED? Yes Was to pt encouraged to call back with questions or concerns? Yes

## 2022-03-03 ENCOUNTER — Ambulatory Visit (HOSPITAL_COMMUNITY): Payer: BC Managed Care – PPO | Attending: Cardiology

## 2022-03-03 DIAGNOSIS — R0609 Other forms of dyspnea: Secondary | ICD-10-CM

## 2022-03-03 LAB — ECHOCARDIOGRAM COMPLETE
Area-P 1/2: 3.36 cm2
S' Lateral: 3.6 cm

## 2022-03-09 ENCOUNTER — Encounter: Payer: Self-pay | Admitting: Physician Assistant

## 2022-03-09 ENCOUNTER — Ambulatory Visit: Payer: BC Managed Care – PPO | Attending: Physician Assistant | Admitting: Physician Assistant

## 2022-03-09 VITALS — BP 130/78 | HR 76 | Ht 65.0 in | Wt 265.0 lb

## 2022-03-09 DIAGNOSIS — R0602 Shortness of breath: Secondary | ICD-10-CM

## 2022-03-09 DIAGNOSIS — I4891 Unspecified atrial fibrillation: Secondary | ICD-10-CM | POA: Diagnosis not present

## 2022-03-09 DIAGNOSIS — Z6841 Body Mass Index (BMI) 40.0 and over, adult: Secondary | ICD-10-CM | POA: Diagnosis not present

## 2022-03-09 DIAGNOSIS — R002 Palpitations: Secondary | ICD-10-CM

## 2022-03-09 DIAGNOSIS — G4730 Sleep apnea, unspecified: Secondary | ICD-10-CM | POA: Diagnosis not present

## 2022-03-09 DIAGNOSIS — G4733 Obstructive sleep apnea (adult) (pediatric): Secondary | ICD-10-CM

## 2022-03-09 DIAGNOSIS — I10 Essential (primary) hypertension: Secondary | ICD-10-CM

## 2022-03-09 DIAGNOSIS — R0609 Other forms of dyspnea: Secondary | ICD-10-CM

## 2022-03-09 NOTE — Patient Instructions (Signed)
Medication Instructions:  Your physician recommends that you continue on your current medications as directed. Please refer to the Current Medication list given to you today.  *If you need a refill on your cardiac medications before your next appointment, please call your pharmacy*   Lab Work: None If you have labs (blood work) drawn today and your tests are completely normal, you will receive your results only by: [place] (if you have MyChart) OR A paper copy in the mail If you have any lab test that is abnormal or we need to change your treatment, we will call you to review the results.   Follow-Up: At [facility], you and your health needs are our priority.  As part of our continuing mission to provide you with exceptional heart care, we have created designated Provider Care Teams.  These Care Teams include your primary Cardiologist (physician) and Advanced Practice Providers (APPs -  Physician Assistants and Nurse Practitioners) who all work together to provide you with the care you need, when you need it.  Your next appointment:   3 month(s)  Provider:   Early Osmond, MD

## 2022-03-09 NOTE — Progress Notes (Signed)
Office Visit    Patient Name: Katherine Lewis Date of Encounter: 03/09/2022  PCP:  Denita Lung, MD   [place]  Cardiologist:  Early Osmond, MD  Advanced Practice Provider:  No care team member to display Electrophysiologist:  None   HPI    Katherine Lewis is a 52 y.o. female with hypertension, atrial fibrillation, OSA, and obesity presents today for follow-up appointment  She was seen by Dr. Burt Ek 06/2021 and at that time was well-controlled on statins also has a for atrial fibrillation.  Blood pressure was also well-controlled.  She was potentially needing titration of her CPAP device.  Otherwise, was feeling well.  She was seen by me a few weeks ago and she presented with  complaint of irregular heartbeat and shortness she states the other night she was in atrial fibrillation for 2 hours straight.  She also is having more frequent bouts.  She also describes like fluttering in her chest.  She uses a Kardia device and usually uses normal sinus rhythm.  She also has subsided.  She states the fluttering in her chest causes cough.  She also needed a titration of her CPAP device.  She does state that she drank more alcohol over the holidays which may have perpetuated these instances.  She stopped drinking She does feel like this has helped.  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  This reason she is asked for today handicap placard.  She states that she has to make several stops before getting to her office and she is even had to schedule some virtual visits.  Today, she states she has had one episode of an elevated HR the night after having a few drinks. She has not had any other events. She is better controlled on the higher dose of cardizem, BP is well controlled. She is still SOB.  She states she had a bad case of COVID  back in May 2021.  A year after she had a severe viral infection.  Recommended getting workup through her PCP. She had terrible back spasms back on Feb 5th and was in the ED. She was started on Flexeril and lidocaine patches.   Reports no chest pain, pressure, or tightness. No edema, orthopnea, PND.  Past Medical History    Past Medical History:  Diagnosis Date   Anxiety    Depression    GERD (gastroesophageal reflux disease)    Hypertension    Hypothyroidism    Insomnia    Plantar fasciitis    Sleep apnea    does not wear Cpap   Vitamin D deficiency    Past Surgical History:  Procedure Laterality Date   LAPAROSCOPIC GASTRIC SLEEVE RESECTION N/A 04/23/2014   Procedure: LAPAROSCOPIC GASTRIC SLEEVE RESECTION WITH UPPER ENDOSCOPY;  Surgeon: Johnathan Hausen, MD;  Location: [facility];  Service: General;  Laterality: N/A;   MYRINGOTOMY     bilateral    TOTAL KNEE ARTHROPLASTY Right 01/02/2019   Procedure: RIGHT TOTAL KNEE ARTHROPLASTY;  Surgeon: Meredith Pel, MD;  Location: [place];  Service: Orthopedics;  Laterality: Right;    Allergies  Allergies  Allergen Reactions   Codeine Nausea Only    EKGs/Labs/Other Studies Reviewed:   The following studies were reviewed today:  Echocardiogram 12/08/20 IMPRESSIONS     1. Left ventricular ejection  fraction, by estimation, is 60 to 65%. The  left ventricle has normal function. The left ventricle has no regional  wall motion abnormalities. There is mild left ventricular hypertrophy.  Left ventricular diastolic parameters  were normal. The average left ventricular global longitudinal strain is  -18.9 %. The global longitudinal strain is normal.   2. Right ventricular systolic function is normal. The right ventricular  size is normal.   3. Left atrial size was mildly dilated.   4. A small pericardial effusion is present. The pericardial effusion is  posterior to the left ventricle.   5. The mitral valve is normal in structure. No  evidence of mitral valve  regurgitation. No evidence of mitral stenosis.   6. The aortic valve is tricuspid. Aortic valve regurgitation is not  visualized. No aortic stenosis is present.   7. The inferior vena cava is normal in size with greater than 50%  respiratory variability, suggesting right atrial pressure of 3 mmHg.   FINDINGS   Left Ventricle: Left ventricular ejection fraction, by estimation, is 60  to 65%. The left ventricle has normal function. The left ventricle has no  regional wall motion abnormalities. The average left ventricular global  longitudinal strain is -18.9 %.  The global longitudinal strain is normal. The left ventricular internal  cavity size was normal in size. There is mild left ventricular  hypertrophy. Left ventricular diastolic parameters were normal.   Right Ventricle: The right ventricular size is normal. No increase in  right ventricular wall thickness. Right ventricular systolic function is  normal.   Left Atrium: Left atrial size was mildly dilated.   Right Atrium: Right atrial size was normal in size.   Pericardium: A small pericardial effusion is present. The pericardial  effusion is posterior to the left ventricle.   Mitral Valve: The mitral valve is normal in structure. No evidence of  mitral valve regurgitation. No evidence of mitral valve stenosis.   Tricuspid Valve: The tricuspid valve is normal in structure. Tricuspid  valve regurgitation is not demonstrated. No evidence of tricuspid  stenosis.   Aortic Valve: The aortic valve is tricuspid. Aortic valve regurgitation is  not visualized. No aortic stenosis is present.   Pulmonic Valve: The pulmonic valve was normal in structure. Pulmonic valve  regurgitation is not visualized. No evidence of pulmonic stenosis.   Aorta: The aortic root is normal in size and structure.   Venous: The inferior vena cava is normal in size with greater than 50%  respiratory variability, suggesting right  atrial pressure of 3 mmHg.   IAS/Shunts: No atrial level shunt detected by color flow Doppler.   EKG:  EKG is not ordered today.   Recent Labs: 06/15/2021: ALT 23; B Natriuretic Peptide 50.2; BUN 8; Creatinine, Ser 0.83; Hemoglobin 13.9; Magnesium 2.0; Platelets 142; Potassium 3.9; Sodium 137  Recent Lipid Panel    Component Value Date/Time   CHOL 208 (H) 02/02/2021 0909   TRIG 165 (H) 02/02/2021 0909   HDL 43 02/02/2021 0909   CHOLHDL 4.8 (H) 02/02/2021 0909   LDLCALC 135 (H) 02/02/2021 0909    Risk Assessment/Calculations:   CHA2DS2-VASc Score = 2   This indicates a 2.2% annual risk of stroke. The patient's score is based upon: CHF History: 0 HTN History: 1 Diabetes History: 0 Stroke History: 0 Vascular Disease History: 0 Age Score: 0 Gender Score: 1      Home Medications   Current Meds  Medication Sig   apixaban (ELIQUIS) 5 MG TABS tablet  TAKE ONE TABLET BY MOUTH TWICE DAILY   cyclobenzaprine (FLEXERIL) 10 MG tablet Take 1 tablet (10 mg total) by mouth 3 (three) times daily as needed for muscle spasms.   diltiazem (CARDIZEM CD) 180 MG 24 hr capsule Take 1 capsule (180 mg total) by mouth daily.   escitalopram (LEXAPRO) 5 MG tablet TAKE ONE TABLET BY MOUTH ONCE DAILY   levonorgestrel (MIRENA) 20 MCG/24HR IUD 1 each by Intrauterine route once. Reported on 06/23/2015   losartan-hydrochlorothiazide (HYZAAR) 50-12.5 MG tablet TAKE 1/2 TABLET ONCE DAILY   pantoprazole (PROTONIX) 40 MG tablet TAKE ONE TABLET BY MOUTH ONCE DAILY   RESTASIS 0.05 % ophthalmic emulsion Place 1 drop into both eyes 2 (two) times daily.     Review of Systems      All other systems reviewed and are otherwise negative except as noted above.  Physical Exam    VS:  BP 130/78   Pulse 76   Ht 5' 5"$  (1.651 m)   Wt 265 lb (120.2 kg)   SpO2 96%   BMI 44.10 kg/m  , BMI Body mass index is 44.1 kg/m.  Wt Readings from Last 3 Encounters:  03/09/22 265 lb (120.2 kg)  02/22/22 267 lb 9.6 oz (121.4  kg)  02/05/22 267 lb 9.6 oz (121.4 kg)     GEN: Well nourished, well developed, in no acute distress. HEENT: normal. Neck: Supple, no JVD, carotid bruits, or masses. Cardiac: RRR, no murmurs, rubs, or gallops. No clubbing, cyanosis, edema.  Radials/PT 2+ and equal bilaterally.  Respiratory:  Respirations regular and unlabored, clear to auscultation bilaterally. GI: Soft, nontender, nondistended. MS: No deformity or atrophy. Skin: Warm and dry, no rash. Neuro:  Strength and sensation are intact. Psych: Normal affect.  Assessment & Plan    Atrial fibrillation -continue current medications -Limit alcohol and caffeine use, alcohol was her trigger -Maintain hydration at 64 ounces of water daily -Continue to use Kardia mobile device to spot check rhythm -echo reviewed with her today  Hypertension -well controlled today -continue cardizem CD 178m   OSA -May need some potential CPAP titration. -Will schedule her some follow-up with Dr. TRadford Pax Shortness of breath -Mostly exertional -normal echo -no LE edema today, but she does endorse having it at times -recommend workup with GP -coronary calcium score 0  10/22      Disposition: Follow up 3 months with AEarly Osmond MD or APP.  Signed, TElgie Collard PA-C 03/09/2022, 3:51 PM [facility]

## 2022-03-19 ENCOUNTER — Other Ambulatory Visit: Payer: Self-pay | Admitting: Family Medicine

## 2022-03-19 DIAGNOSIS — F341 Dysthymic disorder: Secondary | ICD-10-CM

## 2022-03-19 NOTE — Telephone Encounter (Signed)
Refill request last apt 10/06/21.

## 2022-04-30 ENCOUNTER — Encounter: Payer: Self-pay | Admitting: Internal Medicine

## 2022-05-17 ENCOUNTER — Encounter: Payer: Self-pay | Admitting: Internal Medicine

## 2022-05-17 ENCOUNTER — Ambulatory Visit: Payer: BC Managed Care – PPO | Attending: Internal Medicine | Admitting: Internal Medicine

## 2022-05-17 VITALS — BP 120/80 | HR 68 | Ht 65.0 in | Wt 271.2 lb

## 2022-05-17 DIAGNOSIS — G4730 Sleep apnea, unspecified: Secondary | ICD-10-CM | POA: Diagnosis not present

## 2022-05-17 DIAGNOSIS — E669 Obesity, unspecified: Secondary | ICD-10-CM

## 2022-05-17 DIAGNOSIS — I480 Paroxysmal atrial fibrillation: Secondary | ICD-10-CM

## 2022-05-17 DIAGNOSIS — I5030 Unspecified diastolic (congestive) heart failure: Secondary | ICD-10-CM | POA: Diagnosis not present

## 2022-05-17 MED ORDER — EMPAGLIFLOZIN 10 MG PO TABS: 10.0000 mg | ORAL_TABLET | Freq: Every day | ORAL | 0 refills | Status: DC

## 2022-05-17 MED ORDER — EMPAGLIFLOZIN 10 MG PO TABS: 10.0000 mg | ORAL_TABLET | Freq: Every day | ORAL | 3 refills | Status: DC

## 2022-05-17 NOTE — Progress Notes (Signed)
Cardiology Office Note:    Date:  05/17/2022   ID:  Katherine Lewis, DOB [birth year], MRN [id number]  PCP:  Ronnald Nian, MD   [facility] Providers Cardiologist:  Orbie Pyo, MD     Referring MD: Ronnald Nian, MD   CC:  Review queries of MR  History of Present Illness:    Katherine Lewis is a 52 y.o. female with a hx of mild mitral regurgitation, paroxysmal atrial fibrillation, HTN, OSA, and obesity.  She is well managed by Dr. Lynnette Caffey.  She has some questions about her care and I am seeing her today to discuss these concerns.  Patient notes that she is doing overall poorly.   She reviews her prior diagnosis of COVID-19: she had symptomatic atrial fibrillation, but has had no palpitations.  She reviews her intermittent chest pain (twice a week, not purely exertional).  Since her CCTA without the presence of CAC, she has not have improvement or worsening.  She has not had bleeding issues of DOAC.  She notes that she has had issues with CPAP.  She has felt worsening breathing after taking the mask on; she sees Dr. Jet[facility]) but she has not been seen since initial titration.  She has noted DOE.  She is unable to hike or walk like she used to be.  She has had new LE edema.   Past Medical History:  Diagnosis Date   Anxiety    Depression    GERD (gastroesophageal reflux disease)    Hypertension    Hypothyroidism    Insomnia    Plantar fasciitis    Sleep apnea    does not wear Cpap   Vitamin D deficiency     Past Surgical History:  Procedure Laterality Date   LAPAROSCOPIC GASTRIC SLEEVE RESECTION N/A 04/23/2014   Procedure: LAPAROSCOPIC GASTRIC SLEEVE RESECTION WITH UPPER ENDOSCOPY;  Surgeon: Luretha Murphy, MD;  Location: [facility];  Service: General;  Laterality: N/A;   MYRINGOTOMY     bilateral    TOTAL KNEE ARTHROPLASTY Right 01/02/2019   Procedure: RIGHT TOTAL KNEE ARTHROPLASTY;  Surgeon: Cammy Copa, MD;  Location: [facility] OR;  Service:  Orthopedics;  Laterality: Right;    Current Medications: Current Meds  Medication Sig   apixaban (ELIQUIS) 5 MG TABS tablet TAKE ONE TABLET BY MOUTH TWICE DAILY   cyclobenzaprine (FLEXERIL) 10 MG tablet Take 1 tablet (10 mg total) by mouth 3 (three) times daily as needed for muscle spasms.   diltiazem (CARDIZEM CD) 180 MG 24 hr capsule Take 1 capsule (180 mg total) by mouth daily.   empagliflozin (JARDIANCE) 10 MG TABS tablet Take 1 tablet (10 mg total) by mouth daily before breakfast.   empagliflozin (JARDIANCE) 10 MG TABS tablet Take 1 tablet (10 mg total) by mouth daily before breakfast.   escitalopram (LEXAPRO) 5 MG tablet TAKE ONE TABLET BY MOUTH ONCE DAILY   levonorgestrel (MIRENA) 20 MCG/24HR IUD 1 each by Intrauterine route once. Reported on 06/23/2015   losartan-hydrochlorothiazide (HYZAAR) 50-12.5 MG tablet TAKE 1/2 TABLET ONCE DAILY   pantoprazole (PROTONIX) 40 MG tablet TAKE ONE TABLET BY MOUTH ONCE DAILY     Allergies:   Codeine   Social History   Socioeconomic History   Marital status: Married    Spouse name: Not on file   Number of children: Not on file   Years of education: Not on file   Highest education level: Doctorate  Occupational History   Occupation: Programmer, systems  Employer: [employer]  Tobacco Use   Smoking status: Former    Packs/day: 0.50    Years: 6.00    Additional pack years: 0.00    Total pack years: 3.00    Types: Cigarettes    Quit date: 01/19/1992    Years since quitting: 30.3   Smokeless tobacco: Never   Tobacco comments:    social   Vaping Use   Vaping Use: Never used  Substance and Sexual Activity   Alcohol use: Yes    Comment: social    Drug use: No   Sexual activity: Yes    Birth control/protection: I.U.D.    Comment: Mirena IUD  Other Topics Concern   Not on file  Social History Narrative   Not on file   Social Determinants of Health   Financial Resource Strain: Low Risk  (05/21/2021)   Overall Financial Resource  Strain (CARDIA)    Difficulty of Paying Living Expenses: Not hard at all  Food Insecurity: No Food Insecurity (05/21/2021)   Hunger Vital Sign    Worried About Running Out of Food in the Last Year: Never true    Ran Out of Food in the Last Year: Never true  Transportation Needs: No Transportation Needs (05/21/2021)   PRAPARE - Administrator, Civil Service (Medical): No    Lack of Transportation (Non-Medical): No  Physical Activity: Unknown (05/21/2021)   Exercise Vital Sign    Days of Exercise per Week: 0 days    Minutes of Exercise per Session: Not on file  Stress: No Stress Concern Present (05/21/2021)   [employer] of Occupational Health - Occupational Stress Questionnaire    Feeling of Stress : Not at all  Social Connections: Moderately Isolated (05/21/2021)   Social Connection and Isolation Panel [NHANES]    Frequency of Communication with Friends and Family: More than three times a week    Frequency of Social Gatherings with Friends and Family: More than three times a week    Attends Religious Services: Never    Database administrator or Organizations: No    Attends Engineer, structural: Not on file    Marital Status: Married     Family History: The patient's family history includes Diabetes in her mother; Diabetes type II in her mother; Lung cancer in her paternal grandfather and paternal grandmother.  ROS:   Please see the history of present illness.     All other systems reviewed and are negative.  EKGs/Labs/Other Studies Reviewed:    The following studies were reviewed today:   EKG:  EKG is  ordered today.  The ekg ordered today demonstrates  05/17/2022: SR with low voltage QRS  Cardiac Studies & Procedures       ECHOCARDIOGRAM  ECHOCARDIOGRAM COMPLETE 03/03/2022  Narrative ECHOCARDIOGRAM REPORT    Patient Name:   Katherine Lewis  Date of Exam: 03/03/2022 Medical Rec #:  [id number]     Height:       65.0 in Accession #:    [id number]     Weight:       267.6 lb Date of Birth:  1970/10/21    BSA:          2.239 m Patient Age:    51 years      BP:           138/86 mmHg Patient Gender: F             HR:  60 bpm. Exam Location:  [address]  Procedure: 2D Echo, Cardiac Doppler, Color Doppler and Strain Analysis  Indications:    R06.00 DOE  History:        Patient has prior history of Echocardiogram examinations, most recent 12/08/2020. Arrythmias:Atrial Fibrillation; Risk Factors:Hypertension and Sleep Apnea.  Sonographer:    Clearence Ped RCS Referring Phys: [id number] TESSA N CONTE  IMPRESSIONS   1. Left ventricular ejection fraction, by estimation, is 60 to 65%. The left ventricle has normal function. The left ventricle has no regional wall motion abnormalities. Left ventricular diastolic parameters were normal. 2. Right ventricular systolic function is normal. The right ventricular size is normal. There is normal pulmonary artery systolic pressure. 3. Left atrial size was mildly dilated. 4. Right atrial size was mildly dilated. 5. A small pericardial effusion is present. The pericardial effusion is posterior to the left ventricle. 6. The mitral valve is normal in structure. Mild mitral valve regurgitation. No evidence of mitral stenosis. 7. The aortic valve is tricuspid. There is mild calcification of the aortic valve. Aortic valve regurgitation is not visualized. Aortic valve sclerosis/calcification is present, without any evidence of aortic stenosis. 8. Aortic dilatation noted. There is borderline dilatation of the ascending aorta, measuring 37 mm. 9. The inferior vena cava is normal in size with greater than 50% respiratory variability, suggesting right atrial pressure of 3 mmHg.  FINDINGS Left Ventricle: Left ventricular ejection fraction, by estimation, is 60 to 65%. The left ventricle has normal function. The left ventricle has no regional wall motion abnormalities. The left ventricular internal cavity size  was normal in size. There is no left ventricular hypertrophy. Left ventricular diastolic parameters were normal.  Right Ventricle: The right ventricular size is normal. No increase in right ventricular wall thickness. Right ventricular systolic function is normal. There is normal pulmonary artery systolic pressure. The tricuspid regurgitant velocity is 2.03 m/s, and with an assumed right atrial pressure of 3 mmHg, the estimated right ventricular systolic pressure is 19.5 mmHg.  Left Atrium: Left atrial size was mildly dilated.  Right Atrium: Right atrial size was mildly dilated.  Pericardium: A small pericardial effusion is present. The pericardial effusion is posterior to the left ventricle.  Mitral Valve: The mitral valve is normal in structure. Mild mitral valve regurgitation. No evidence of mitral valve stenosis.  Tricuspid Valve: The tricuspid valve is normal in structure. Tricuspid valve regurgitation is mild . No evidence of tricuspid stenosis.  Aortic Valve: The aortic valve is tricuspid. There is mild calcification of the aortic valve. Aortic valve regurgitation is not visualized. Aortic valve sclerosis/calcification is present, without any evidence of aortic stenosis.  Pulmonic Valve: The pulmonic valve was normal in structure. Pulmonic valve regurgitation is not visualized. No evidence of pulmonic stenosis.  Aorta: Aortic dilatation noted. There is borderline dilatation of the ascending aorta, measuring 37 mm.  Venous: The inferior vena cava is normal in size with greater than 50% respiratory variability, suggesting right atrial pressure of 3 mmHg.  IAS/Shunts: No atrial level shunt detected by color flow Doppler.   LEFT VENTRICLE PLAX 2D LVIDd:         5.30 cm   Diastology LVIDs:         3.60 cm   LV e' medial:    7.62 cm/s LV PW:         1.30 cm   LV E/e' medial:  13.9 LV IVS:        1.00 cm   LV e' lateral:  13.90 cm/s LVOT diam:     1.90 cm   LV E/e' lateral: 7.6 LV  SV:         82 LV SV Index:   37        2D Longitudinal Strain LVOT Area:     2.84 cm  2D Strain GLS (A2C):   -21.8 % 2D Strain GLS (A3C):   -27.0 % 2D Strain GLS (A4C):   -19.2 % 2D Strain GLS Avg:     -22.7 %  RIGHT VENTRICLE RV Basal diam:  3.10 cm RV S prime:     10.90 cm/s TAPSE (M-mode): 1.9 cm RVSP:           19.5 mmHg  LEFT ATRIUM             Index        RIGHT ATRIUM           Index LA diam:        4.70 cm 2.10 cm/m   RA Pressure: 3.00 mmHg LA Vol (A2C):   62.1 ml 27.74 ml/m  RA Area:     14.90 cm LA Vol (A4C):   59.7 ml 26.67 ml/m  RA Volume:   38.40 ml  17.15 ml/m LA Biplane Vol: 60.2 ml 26.89 ml/m AORTIC VALVE LVOT Vmax:   129.00 cm/s LVOT Vmean:  80.700 cm/s LVOT VTI:    0.289 m  AORTA Ao Root diam: 3.00 cm Ao Asc diam:  3.70 cm  MITRAL VALVE                TRICUSPID VALVE MV Area (PHT):              TR Peak grad:   16.5 mmHg MV Decel Time:              TR Vmax:        203.00 cm/s MV E velocity: 106.00 cm/s  Estimated RAP:  3.00 mmHg MV A velocity: 88.00 cm/s   RVSP:           19.5 mmHg MV E/A ratio:  1.20 SHUNTS Systemic VTI:  0.29 m Systemic Diam: 1.90 cm  Arvilla Meres MD Electronically signed by Arvilla Meres MD Signature Date/Time: 03/03/2022/12:41:02 PM    Final    MONITORS  LONG TERM MONITOR (3-14 DAYS) 11/26/2020  Narrative Patch Wear Time:  9 days and 0 hours (2022-10-19T06:47:10-398 to 2022-10-28T07:01:44-0400)  Patient had a min HR of 57 bpm, max HR of 224 bpm, and avg HR of 87 bpm. Predominant underlying rhythm was Sinus Rhythm. Atrial Fibrillation occurred (<1% burden), ranging from 105-224 bpm (avg of 152 bpm), the longest lasting 1 hour 49 mins with an avg rate of 152 bpm. Atrial Fibrillation was detected within +/- 45 seconds of symptomatic patient event(s). Isolated SVEs were rare (<1.0%), SVE Couplets were rare (<1.0%), and SVE Triplets were rare (<1.0%). Isolated VEs were rare (<1.0%), and no VE Couplets or VE Triplets  were present.   CT SCANS  CT CORONARY MORPH W/CTA COR W/SCORE 11/03/2020  Addendum 11/03/2020  9:50 PM ADDENDUM REPORT: 11/03/2020 21:48  CLINICAL DATA:  Chest pain  EXAM: Cardiac/Coronary CTA  TECHNIQUE: A non-contrast, gated CT scan was obtained with axial slices of 3 mm through the heart for calcium scoring. Calcium scoring was performed using the Agatston method. A 120 kV prospective, gated, contrast cardiac scan was obtained. Gantry rotation speed was 250 msecs and collimation was 0.6 mm. Two sublingual nitroglycerin tablets (0.8 mg) were given. The  3D data set was reconstructed in 5% intervals of the 35-75% of the R-R cycle. Diastolic phases were analyzed on a dedicated workstation using MPR, MIP, and VRT modes. The patient received 95 cc of contrast.  FINDINGS: Image quality: Excellent.  Noise artifact is: Limited.  Coronary Arteries:  Normal coronary origin.  Right dominance.  Left main: The left main is a large caliber vessel with a normal take off from the left coronary cusp that bifurcates to form a left anterior descending artery and a left circumflex artery. There is no plaque or stenosis.  Left anterior descending artery: The LAD is patent without evidence of plaque or stenosis. The LAD gives off 2 patent diagonal branches.  Left circumflex artery: The LCX is non-dominant and patent with no evidence of plaque or stenosis. The LCX gives off 2 patent obtuse marginal branches.  Right coronary artery: The RCA is dominant with normal take off from the right coronary cusp. There is no evidence of plaque or stenosis. The RCA terminates as a PDA and right posterolateral branch without evidence of plaque or stenosis.  Right Atrium: Right atrial size is within normal limits.  Right Ventricle: The right ventricular cavity is within normal limits.  Left Atrium: Left atrial size is normal in size with no left atrial appendage filling defect.  Left Ventricle:  The ventricular cavity size is within normal limits. There are no stigmata of prior infarction. There is no abnormal filling defect.  Pulmonary arteries: Normal in size without proximal filling defect.  Pulmonary veins: Normal pulmonary venous drainage.  Pericardium: Normal thickness with no significant effusion or calcium present.  Cardiac valves: The aortic valve is trileaflet without significant calcification. The mitral valve is normal structure without significant calcification.  Aorta: Normal caliber with no significant disease.  Extra-cardiac findings: See attached radiology report for non-cardiac structures.  IMPRESSION: 1. Coronary calcium score of 0. This was 0 percentile for age-, sex, and race-matched controls.  2. Normal coronary origin with right dominance.  3. Normal coronary arteries.  CAD RADS 0.  RECOMMENDATIONS: 1. CAD-RADS 0: No evidence of CAD (0%). Consider non-atherosclerotic causes of chest pain.  2. CAD-RADS 1: Minimal non-obstructive CAD (0-24%). Consider non-atherosclerotic causes of chest pain. Consider preventive therapy and risk factor modification.  3. CAD-RADS 2: Mild non-obstructive CAD (25-49%). Consider non-atherosclerotic causes of chest pain. Consider preventive therapy and risk factor modification.  4. CAD-RADS 3: Moderate stenosis. Consider symptom-guided anti-ischemic pharmacotherapy as well as risk factor modification per guideline directed care. Additional analysis with CT FFR will be submitted.  5. CAD-RADS 4: Severe stenosis. (70-99% or > 50% left main). Cardiac catheterization or CT FFR is recommended. Consider symptom-guided anti-ischemic pharmacotherapy as well as risk factor modification per guideline directed care. Invasive coronary angiography recommended with revascularization per published guideline statements.  6. CAD-RADS 5: Total coronary occlusion (100%). Consider cardiac catheterization or viability  assessment. Consider symptom-guided anti-ischemic pharmacotherapy as well as risk factor modification per guideline directed care.  7. CAD-RADS N: Non-diagnostic study. Obstructive CAD can't be excluded. Alternative evaluation is recommended.  Armanda Magic, MD   Electronically Signed By: Armanda Magic M.D. On: 11/03/2020 21:48  Narrative EXAM: OVER-READ INTERPRETATION  CT CHEST  The following report is an over-read performed by radiologist Dr. Richarda Overlie of [facility] on 11/03/2020. This over-read does not include interpretation of cardiac or coronary anatomy or pathology. The coronary calcium score/coronary CTA interpretation by the cardiologist is attached.  COMPARISON:  None.  FINDINGS: Vascular: Normal caliber  of the visualized thoracic aorta.  Mediastinum/Nodes: Visualized mediastinal structures are normal.  Lungs/Pleura: 4 mm nodule at the left lung base on sequence 11, image 49. No pleural effusions. No large areas of airspace disease or lung consolidation.  Upper Abdomen: Left hepatic lobe may be prominent but this is incompletely evaluated.  Musculoskeletal: No acute bone abnormality.  IMPRESSION: 1. No acute extracardiac findings. 2. Indeterminate 4 mm nodule at the left lung base. No follow-up needed if patient is low-risk. Non-contrast chest CT can be considered in 12 months if patient is high-risk. This recommendation follows the consensus statement: Guidelines for Management of Incidental Pulmonary Nodules Detected on CT Images: From the Fleischner Society 2017; Radiology 2017; 284:228-243.  Electronically Signed: By: Richarda Overlie M.D. On: 11/03/2020 16:58             Recent Labs: 06/15/2021: ALT 23; B Natriuretic Peptide 50.2; BUN 8; Creatinine, Ser 0.83; Hemoglobin 13.9; Magnesium 2.0; Platelets 142; Potassium 3.9; Sodium 137  Recent Lipid Panel    Component Value Date/Time   CHOL 208 (H) 02/02/2021 0909   TRIG 165 (H)  02/02/2021 0909   HDL 43 02/02/2021 0909   CHOLHDL 4.8 (H) 02/02/2021 0909   LDLCALC 135 (H) 02/02/2021 0909        Physical Exam:    VS:  BP 120/80   Pulse 68   Ht 5\' 5"  (1.651 m)   Wt 271 lb 3.2 oz (123 kg)   SpO2 98%   BMI 45.13 kg/m     Wt Readings from Last 3 Encounters:  05/17/22 271 lb 3.2 oz (123 kg)  03/09/22 265 lb (120.2 kg)  02/22/22 267 lb 9.6 oz (121.4 kg)     GEN: NAD, Morbid obesity HEENT: Normal NECK: No JVD CARDIAC: RRR, no murmurs, rubs, gallops RESPIRATORY:  Clear to auscultation without rales, wheezing or rhonchi  ABDOMEN: Soft, non-tender, non-distended MUSCULOSKELETAL:  trace bilateral edema; No deformity  SKIN: Warm and dry NEUROLOGIC:  Alert and oriented x 3 PSYCHIATRIC:  Normal affect   ASSESSMENT:    1. Heart failure with preserved ejection fraction, unspecified HF chronicity (HCC)   2. Paroxysmal atrial fibrillation (HCC)   3. Obesity (BMI 35.0-39.9 without comorbidity)   4. Severe sleep apnea    PLAN:    Mild mitral regurgitation Clinical syndrome of HFpEF LE edema - her DOE appears to me multi-factorial; agree with OSA and MO therapy, given her LE edema, I think a trial of SGLT2i would be reasonable We have discussed started an SGLT2i for (HFpEF). - No history of DKA or frequent UTIs. - We have discussed risks - we have discussed costs  - will start Jardiance - BMP and BNP in two weeks, may start MRA or lasix  Paroxysmal Atrial Fibrillation,  - Risk factors include HTN, OSA, Morbid Obesity - CHADSVASC=2-3. - HASBLED= 1 - Continue anticoagulation; Acquired Thrombophilia - with no evidence of mod/severe MS, rheumatic disease, or mechanical valve - based on bleeding risk, CBC stable in 2023.  imaging notable for mild left atrial dilation - discussed  alcohol - discussed weight loss of 10% (BMI is greater than 27) as preventive factor; she is limited in her exercise due to DOE - she has OSA; this is a component of her symptoms;  would recommend her following back up with Dr. Maple Hudson for titration - Continue rate control with CCB for now  HLD - no CAC - in future visits may discuss medical management   Summer f/u either with me  or Dr. Lynnette Caffey  Time Spent Directly with Patient:   I have spent a total of 50 minutes with the patient reviewing notes, imaging, EKGs, labs and examining the patient as well as establishing an assessment and plan that was discussed personally with the patient.  > 50% of time was spent in direct patient care and and reviewing imaging with patient .       Medication Adjustments/Labs and Tests Ordered: Current medicines are reviewed at length with the patient today.  Concerns regarding medicines are outlined above.  Orders Placed This Encounter  Procedures   Basic metabolic panel   Pro b natriuretic peptide (BNP)   EKG 12-Lead   Meds ordered this encounter  Medications   empagliflozin (JARDIANCE) 10 MG TABS tablet    Sig: Take 1 tablet (10 mg total) by mouth daily before breakfast.    Dispense:  90 tablet    Refill:  3   empagliflozin (JARDIANCE) 10 MG TABS tablet    Sig: Take 1 tablet (10 mg total) by mouth daily before breakfast.    Dispense:  28 tablet    Refill:  0    Patient Instructions  Medication Instructions:  Your physician has recommended you make the following change in your medication:  START: Empagliflozin (Jardiance) 10 mg by mouth once daily before breakfast; we have provided you with samples, 30 day free trial offer, $10 co pay card and information for patient assistance  *If you need a refill on your cardiac medications before your next appointment, please call your pharmacy*   Lab Work: IN 2 WEEKS: BMP, BNP  If you have labs (blood work) drawn today and your tests are completely normal, you will receive your results only by: MyChart Message (if you have MyChart) OR A paper copy in the mail If you have any lab test that is abnormal or we need to change  your treatment, we will call you to review the results.   Testing/Procedures: NONE   Follow-Up: At [facility], you and your health needs are our priority.  As part of our continuing mission to provide you with exceptional heart care, we have created designated Provider Care Teams.  These Care Teams include your primary Cardiologist (physician) and Advanced Practice Providers (APPs -  Physician Assistants and Nurse Practitioners) who all work together to provide you with the care you need, when you need it.   Your next appointment:   summer  Provider:   Riley Lam, MD    Signed, Christell Constant, MD  05/17/2022 11:23 AM    [facility]

## 2022-05-17 NOTE — Patient Instructions (Addendum)
Medication Instructions:  Your physician has recommended you make the following change in your medication:  START: Empagliflozin (Jardiance) 10 mg by mouth once daily before breakfast; we have provided you with samples, 30 day free trial offer, $10 co pay card and information for patient assistance  *If you need a refill on your cardiac medications before your next appointment, please call your pharmacy*   Lab Work: IN 2 WEEKS: BMP, BNP  If you have labs (blood work) drawn today and your tests are completely normal, you will receive your results only by: MyChart Message (if you have MyChart) OR A paper copy in the mail If you have any lab test that is abnormal or we need to change your treatment, we will call you to review the results.   Testing/Procedures: NONE   Follow-Up: At [facility], you and your health needs are our priority.  As part of our continuing mission to provide you with exceptional heart care, we have created designated Provider Care Teams.  These Care Teams include your primary Cardiologist (physician) and Advanced Practice Providers (APPs -  Physician Assistants and Nurse Practitioners) who all work together to provide you with the care you need, when you need it.   Your next appointment:   summer  Provider:   Riley Lam, MD

## 2022-05-23 ENCOUNTER — Other Ambulatory Visit: Payer: Self-pay | Admitting: Family Medicine

## 2022-05-26 ENCOUNTER — Encounter: Payer: Self-pay | Admitting: Internal Medicine

## 2022-05-31 ENCOUNTER — Ambulatory Visit: Payer: BC Managed Care – PPO | Admitting: Internal Medicine

## 2022-05-31 ENCOUNTER — Ambulatory Visit: Payer: BC Managed Care – PPO

## 2022-05-31 MED ORDER — EMPAGLIFLOZIN 10 MG PO TABS: 10.0000 mg | ORAL_TABLET | Freq: Every day | ORAL | 3 refills | Status: DC

## 2022-06-11 ENCOUNTER — Ambulatory Visit: Payer: BC Managed Care – PPO | Attending: Internal Medicine | Admitting: Internal Medicine

## 2022-06-11 DIAGNOSIS — I5030 Unspecified diastolic (congestive) heart failure: Secondary | ICD-10-CM

## 2022-06-12 LAB — BASIC METABOLIC PANEL
BUN/Creatinine Ratio: 14 (ref 9–23)
BUN: 11 mg/dL (ref 6–24)
CO2: 25 mmol/L (ref 20–29)
Calcium: 9 mg/dL (ref 8.7–10.2)
Chloride: 102 mmol/L (ref 96–106)
Creatinine, Ser: 0.79 mg/dL (ref 0.57–1.00)
Glucose: 91 mg/dL (ref 70–99)
Potassium: 4.1 mmol/L (ref 3.5–5.2)
Sodium: 141 mmol/L (ref 134–144)
eGFR: 91 mL/min/{1.73_m2} (ref >59)

## 2022-06-12 LAB — PRO B NATRIURETIC PEPTIDE: NT-Pro BNP: 86 pg/mL (ref 0–249)

## 2022-06-16 ENCOUNTER — Encounter: Payer: Self-pay | Admitting: Internal Medicine

## 2022-06-16 DIAGNOSIS — R0609 Other forms of dyspnea: Secondary | ICD-10-CM

## 2022-06-16 DIAGNOSIS — I5030 Unspecified diastolic (congestive) heart failure: Secondary | ICD-10-CM

## 2022-06-21 NOTE — Telephone Encounter (Signed)
Order placed for Cardiac Rehab per MD note: Katherine Constant, MD  Chelsea Aus, RN; Macie Burows, RN Cc: Orbie Pyo, MD I think we could use chronic diastolic heart failure as the indication.  I sent her a message today and I'll check in with her next week.  Thanks for being so fast.

## 2022-06-23 ENCOUNTER — Encounter (HOSPITAL_COMMUNITY): Payer: Self-pay

## 2022-06-23 ENCOUNTER — Telehealth (HOSPITAL_COMMUNITY): Payer: Self-pay

## 2022-06-23 NOTE — Telephone Encounter (Signed)
Attempted to call patient in regards to Cardiac Rehab - LM on VM Mailed letter 

## 2022-06-25 ENCOUNTER — Other Ambulatory Visit: Payer: Self-pay | Admitting: Internal Medicine

## 2022-06-25 ENCOUNTER — Other Ambulatory Visit: Payer: Self-pay | Admitting: Family Medicine

## 2022-06-25 DIAGNOSIS — I4891 Unspecified atrial fibrillation: Secondary | ICD-10-CM

## 2022-06-25 NOTE — Telephone Encounter (Signed)
Eliquis 5mg  refill request received. Patient is 52 years old, weight-123kg, Crea-0.79 on 06/11/22, Diagnosis-Afib, and last seen by Dr. Izora Ribas on 05/17/22. Dose is appropriate based on dosing criteria. Will send in refill to requested pharmacy.

## 2022-07-08 ENCOUNTER — Telehealth (HOSPITAL_COMMUNITY): Payer: Self-pay | Admitting: *Deleted

## 2022-07-08 NOTE — Telephone Encounter (Signed)
Attestation signed.  Katherine Lewis: stress echo indication is exercise MR and exercise diastology. Feel free to use the [place] Protocol (I haven't used it here but I'm sure it is great; I assume it is based on the Mayo Protocol).  If there is not a [place] Protocol. The key findings post exercise are MR by color flow, CWD and Pulse wave at the leaflet tips; Tissue Doppler, and Tricuspid Valve flow for PASP assessment.  The LV contractile reserve can also be done; at [facility] we used to get most of this assessment in the 4 chamber view.  If you have questions or concerns, feel free to give me a call.  Katherine Lewis: if you would prefer I read the study, I just need to make sure the GXT portion gets uploaded to [facility] and/or MUSE.  If not, feel free to read the study, It should be similar to what we did at [facility], [facility]

## 2022-07-08 NOTE — Progress Notes (Signed)
For Documentation only.

## 2022-07-08 NOTE — Telephone Encounter (Signed)
Left detailed instructions for stress echo on cell# per [facility]

## 2022-07-08 NOTE — Addendum Note (Signed)
Addended by: Riley Lam A on: 07/08/2022 10:44 AM   Modules accepted: Orders

## 2022-07-15 ENCOUNTER — Ambulatory Visit (HOSPITAL_BASED_OUTPATIENT_CLINIC_OR_DEPARTMENT_OTHER): Payer: BC Managed Care – PPO

## 2022-07-15 ENCOUNTER — Ambulatory Visit (HOSPITAL_COMMUNITY): Payer: BC Managed Care – PPO | Attending: Cardiology

## 2022-07-15 DIAGNOSIS — R0609 Other forms of dyspnea: Secondary | ICD-10-CM | POA: Diagnosis not present

## 2022-07-15 DIAGNOSIS — I5030 Unspecified diastolic (congestive) heart failure: Secondary | ICD-10-CM | POA: Insufficient documentation

## 2022-07-16 LAB — ECHOCARDIOGRAM STRESS TEST: Area-P 1/2: 3.91 cm2

## 2022-07-19 ENCOUNTER — Ambulatory Visit: Payer: BC Managed Care – PPO | Attending: Internal Medicine | Admitting: Internal Medicine

## 2022-07-19 ENCOUNTER — Encounter: Payer: Self-pay | Admitting: Internal Medicine

## 2022-07-19 DIAGNOSIS — I10 Essential (primary) hypertension: Secondary | ICD-10-CM

## 2022-07-19 DIAGNOSIS — R5381 Other malaise: Secondary | ICD-10-CM | POA: Insufficient documentation

## 2022-07-19 DIAGNOSIS — I340 Nonrheumatic mitral (valve) insufficiency: Secondary | ICD-10-CM | POA: Diagnosis not present

## 2022-07-19 DIAGNOSIS — G4730 Sleep apnea, unspecified: Secondary | ICD-10-CM

## 2022-07-19 DIAGNOSIS — I480 Paroxysmal atrial fibrillation: Secondary | ICD-10-CM | POA: Diagnosis not present

## 2022-07-19 DIAGNOSIS — E782 Mixed hyperlipidemia: Secondary | ICD-10-CM | POA: Insufficient documentation

## 2022-07-19 NOTE — Patient Instructions (Addendum)
Medication Instructions:  Your physician recommends that you continue on your current medications as directed. Please refer to the Current Medication list given to you today.  *If you need a refill on your cardiac medications before your next appointment, please call your pharmacy*   Lab Work: NONE If you have labs (blood work) drawn today and your tests are completely normal, you will receive your results only by: MyChart Message (if you have MyChart) OR A paper copy in the mail If you have any lab test that is abnormal or we need to change your treatment, we will call you to review the results.   Testing/Procedures: NONE   Follow-Up:With Dr. Izora Ribas as needed you will see Dr. Lynnette Caffey At [facility], you and your health needs are our priority.  As part of our continuing mission to provide you with exceptional heart care, we have created designated Provider Care Teams.  These Care Teams include your primary Cardiologist (physician) and Advanced Practice Providers (APPs -  Physician Assistants and Nurse Practitioners) who all work together to provide you with the care you need, when you need it.   Your next appointment:   1 year(s)  Provider:   Alverda Skeans, MD

## 2022-07-19 NOTE — Progress Notes (Signed)
Cardiology Office Note:    Date:  07/19/2022   ID:  Katherine Lewis, DOB [birth year], MRN [id number]  PCP:  Thomasene Ripple, DO   [facility] Providers Cardiologist:  Orbie Pyo, MD     Referring MD: Ronnald Nian, MD   CC:  Review queries of MR  History of Present Illness:    Katherine Lewis is a 52 y.o. female with a hx of mild mitral regurgitation, paroxysmal atrial fibrillation, HTN, OSA, and obesity.  She is well managed by Dr. Lynnette Caffey.  She had some questions about her care and I saw her in early 2024 to discuss these concerns. 2024: she had a Stress Echo without significant MR or diastolic dysfunction.  Get GDMT for HF she had no improvement and had UTI.    Patient notes that she is doing OK.   Since last visit notes no changes since we made changes . There are no interval hospital/ED visit.    No chest pain or pressure .  Still has DOE and no PND/Orthopnea.    No palpitations or syncope. She has some anxiety when she logs (used the oura ring last year).  Current Medications: Current Meds  Medication Sig   cyclobenzaprine (FLEXERIL) 10 MG tablet Take 1 tablet (10 mg total) by mouth 3 (three) times daily as needed for muscle spasms.   diltiazem (CARDIZEM CD) 180 MG 24 hr capsule Take 1 capsule (180 mg total) by mouth daily.   ELIQUIS 5 MG TABS tablet TAKE ONE TABLET BY MOUTH TWICE DAILY   escitalopram (LEXAPRO) 5 MG tablet TAKE ONE TABLET BY MOUTH ONCE DAILY   levonorgestrel (MIRENA) 20 MCG/24HR IUD 1 each by Intrauterine route once. Reported on 06/23/2015   pantoprazole (PROTONIX) 40 MG tablet TAKE ONE TABLET BY MOUTH ONCE DAILY     The patient's family history includes Diabetes in her mother; Diabetes type II in her mother; Lung cancer in her paternal grandfather and paternal grandmother.  ROS:   Please see the history of present illness.     EKGs/Labs/Other Studies Reviewed:    The following studies were reviewed today:  EKG:   05/17/2022: SR with low  voltage QRS  Cardiac Studies & Procedures     STRESS TESTS  ECHOCARDIOGRAM STRESS TEST 07/16/2022  Narrative EXERCISE STRESS ECHO REPORT   --------------------------------------------------------------------------------  Patient Name:   Katherine Lewis  Date of Exam: 07/15/2022 Medical Rec #:  [id number]     Height:       65.0 in Accession #:    [id number]    Weight:       271.2 lb Date of Birth:  10/10/1970    BSA:          2.251 m Patient Age:    51 years      BP:           168/101 mmHg Patient Gender: F             HR:           82 bpm. Exam Location:  [address]  Procedure: Stress Echo, 2D Echo, Limited Color Doppler and Cardiac Doppler  Indications:    Evaluation of LV function, mitral regurgitation and diastolic function I50.30 Dyspnea on exertion R06.09  History:        Patient has prior history of Echocardiogram examinations. Previous echo revealed LVEF 65% with mild MR.  Sonographer:    Chanetta Marshall BA, RDCS Referring Phys: Ronnald Nian   Conclusion(s)/Recommendation(s): Indication for  stress test to assess increase in PASP with activity in the setting of MR. TR jet was not sufficient in exercise to assess change in PASP. No impairment in diastology with exercise.  FINDINGS  Exam Protocol: The patient exercised on a treadmill according to a Bruce protocol. Imaging not done in standard stress echocardiogram preset.   Patient Performance: The patient exercised for 4 minutes and 0 seconds, achieving 5.7 METS. The maximum stage achieved was I of the Bruce protocol. The baseline heart rate was 82 bpm. The heart rate at peak stress was 142 bpm. The target heart rate was calculated to be 143 bpm. The percentage of maximum predicted heart rate achieved was 84.3 %. The baseline blood pressure was 141/82 mmHg. The blood pressure at peak stress was 185/91 mmHg. The patient developed shortness of breath and fatigue during the stress exam. The symptoms resolved with  rest.  EKG: The patient developed no abnormal EKG findings during exercise.   Carolan Clines Electronically signed on 07/16/2022 at 7:58:33 AM      Final   ECHOCARDIOGRAM  ECHOCARDIOGRAM COMPLETE 03/03/2022  Narrative ECHOCARDIOGRAM REPORT    Patient Name:   Katherine Lewis  Date of Exam: 03/03/2022 Medical Rec #:  [id number]     Height:       65.0 in Accession #:    [id number]    Weight:       267.6 lb Date of Birth:  12-19-1970    BSA:          2.239 m Patient Age:    51 years      BP:           138/86 mmHg Patient Gender: F             HR:           60 bpm. Exam Location:  [address]  Procedure: 2D Echo, Cardiac Doppler, Color Doppler and Strain Analysis  Indications:    R06.00 DOE  History:        Patient has prior history of Echocardiogram examinations, most recent 12/08/2020. Arrythmias:Atrial Fibrillation; Risk Factors:Hypertension and Sleep Apnea.  Sonographer:    Clearence Ped RCS Referring Phys: 74 TESSA N CONTE  IMPRESSIONS   1. Left ventricular ejection fraction, by estimation, is 60 to 65%. The left ventricle has normal function. The left ventricle has no regional wall motion abnormalities. Left ventricular diastolic parameters were normal. 2. Right ventricular systolic function is normal. The right ventricular size is normal. There is normal pulmonary artery systolic pressure. 3. Left atrial size was mildly dilated. 4. Right atrial size was mildly dilated. 5. A small pericardial effusion is present. The pericardial effusion is posterior to the left ventricle. 6. The mitral valve is normal in structure. Mild mitral valve regurgitation. No evidence of mitral stenosis. 7. The aortic valve is tricuspid. There is mild calcification of the aortic valve. Aortic valve regurgitation is not visualized. Aortic valve sclerosis/calcification is present, without any evidence of aortic stenosis. 8. Aortic dilatation noted. There is borderline dilatation of the ascending  aorta, measuring 37 mm. 9. The inferior vena cava is normal in size with greater than 50% respiratory variability, suggesting right atrial pressure of 3 mmHg.  FINDINGS Left Ventricle: Left ventricular ejection fraction, by estimation, is 60 to 65%. The left ventricle has normal function. The left ventricle has no regional wall motion abnormalities. The left ventricular internal cavity size was normal in size. There is no left ventricular hypertrophy. Left ventricular diastolic parameters were  normal.  Right Ventricle: The right ventricular size is normal. No increase in right ventricular wall thickness. Right ventricular systolic function is normal. There is normal pulmonary artery systolic pressure. The tricuspid regurgitant velocity is 2.03 m/s, and with an assumed right atrial pressure of 3 mmHg, the estimated right ventricular systolic pressure is 19.5 mmHg.  Left Atrium: Left atrial size was mildly dilated.  Right Atrium: Right atrial size was mildly dilated.  Pericardium: A small pericardial effusion is present. The pericardial effusion is posterior to the left ventricle.  Mitral Valve: The mitral valve is normal in structure. Mild mitral valve regurgitation. No evidence of mitral valve stenosis.  Tricuspid Valve: The tricuspid valve is normal in structure. Tricuspid valve regurgitation is mild . No evidence of tricuspid stenosis.  Aortic Valve: The aortic valve is tricuspid. There is mild calcification of the aortic valve. Aortic valve regurgitation is not visualized. Aortic valve sclerosis/calcification is present, without any evidence of aortic stenosis.  Pulmonic Valve: The pulmonic valve was normal in structure. Pulmonic valve regurgitation is not visualized. No evidence of pulmonic stenosis.  Aorta: Aortic dilatation noted. There is borderline dilatation of the ascending aorta, measuring 37 mm.  Venous: The inferior vena cava is normal in size with greater than 50% respiratory  variability, suggesting right atrial pressure of 3 mmHg.  IAS/Shunts: No atrial level shunt detected by color flow Doppler.   LEFT VENTRICLE PLAX 2D LVIDd:         5.30 cm   Diastology LVIDs:         3.60 cm   LV e' medial:    7.62 cm/s LV PW:         1.30 cm   LV E/e' medial:  13.9 LV IVS:        1.00 cm   LV e' lateral:   13.90 cm/s LVOT diam:     1.90 cm   LV E/e' lateral: 7.6 LV SV:         82 LV SV Index:   37        2D Longitudinal Strain LVOT Area:     2.84 cm  2D Strain GLS (A2C):   -21.8 % 2D Strain GLS (A3C):   -27.0 % 2D Strain GLS (A4C):   -19.2 % 2D Strain GLS Avg:     -22.7 %  RIGHT VENTRICLE RV Basal diam:  3.10 cm RV S prime:     10.90 cm/s TAPSE (M-mode): 1.9 cm RVSP:           19.5 mmHg  LEFT ATRIUM             Index        RIGHT ATRIUM           Index LA diam:        4.70 cm 2.10 cm/m   RA Pressure: 3.00 mmHg LA Vol (A2C):   62.1 ml 27.74 ml/m  RA Area:     14.90 cm LA Vol (A4C):   59.7 ml 26.67 ml/m  RA Volume:   38.40 ml  17.15 ml/m LA Biplane Vol: 60.2 ml 26.89 ml/m AORTIC VALVE LVOT Vmax:   129.00 cm/s LVOT Vmean:  80.700 cm/s LVOT VTI:    0.289 m  AORTA Ao Root diam: 3.00 cm Ao Asc diam:  3.70 cm  MITRAL VALVE                TRICUSPID VALVE MV Area (PHT):  TR Peak grad:   16.5 mmHg MV Decel Time:              TR Vmax:        203.00 cm/s MV E velocity: 106.00 cm/s  Estimated RAP:  3.00 mmHg MV A velocity: 88.00 cm/s   RVSP:           19.5 mmHg MV E/A ratio:  1.20 SHUNTS Systemic VTI:  0.29 m Systemic Diam: 1.90 cm  Arvilla Meres MD Electronically signed by Arvilla Meres MD Signature Date/Time: 03/03/2022/12:41:02 PM    Final    MONITORS  LONG TERM MONITOR (3-14 DAYS) 11/26/2020  Narrative Patch Wear Time:  9 days and 0 hours (2022-10-19T06:47:10-398 to 2022-10-28T07:01:44-0400)  Patient had a min HR of 57 bpm, max HR of 224 bpm, and avg HR of 87 bpm. Predominant underlying rhythm was Sinus Rhythm. Atrial  Fibrillation occurred (<1% burden), ranging from 105-224 bpm (avg of 152 bpm), the longest lasting 1 hour 49 mins with an avg rate of 152 bpm. Atrial Fibrillation was detected within +/- 45 seconds of symptomatic patient event(s). Isolated SVEs were rare (<1.0%), SVE Couplets were rare (<1.0%), and SVE Triplets were rare (<1.0%). Isolated VEs were rare (<1.0%), and no VE Couplets or VE Triplets were present.   CT SCANS  CT CORONARY MORPH W/CTA COR W/SCORE 11/03/2020  Addendum 11/03/2020  9:50 PM ADDENDUM REPORT: 11/03/2020 21:48  CLINICAL DATA:  Chest pain  EXAM: Cardiac/Coronary CTA  TECHNIQUE: A non-contrast, gated CT scan was obtained with axial slices of 3 mm through the heart for calcium scoring. Calcium scoring was performed using the Agatston method. A 120 kV prospective, gated, contrast cardiac scan was obtained. Gantry rotation speed was 250 msecs and collimation was 0.6 mm. Two sublingual nitroglycerin tablets (0.8 mg) were given. The 3D data set was reconstructed in 5% intervals of the 35-75% of the R-R cycle. Diastolic phases were analyzed on a dedicated workstation using MPR, MIP, and VRT modes. The patient received 95 cc of contrast.  FINDINGS: Image quality: Excellent.  Noise artifact is: Limited.  Coronary Arteries:  Normal coronary origin.  Right dominance.  Left main: The left main is a large caliber vessel with a normal take off from the left coronary cusp that bifurcates to form a left anterior descending artery and a left circumflex artery. There is no plaque or stenosis.  Left anterior descending artery: The LAD is patent without evidence of plaque or stenosis. The LAD gives off 2 patent diagonal branches.  Left circumflex artery: The LCX is non-dominant and patent with no evidence of plaque or stenosis. The LCX gives off 2 patent obtuse marginal branches.  Right coronary artery: The RCA is dominant with normal take off from the right coronary cusp.  There is no evidence of plaque or stenosis. The RCA terminates as a PDA and right posterolateral branch without evidence of plaque or stenosis.  Right Atrium: Right atrial size is within normal limits.  Right Ventricle: The right ventricular cavity is within normal limits.  Left Atrium: Left atrial size is normal in size with no left atrial appendage filling defect.  Left Ventricle: The ventricular cavity size is within normal limits. There are no stigmata of prior infarction. There is no abnormal filling defect.  Pulmonary arteries: Normal in size without proximal filling defect.  Pulmonary veins: Normal pulmonary venous drainage.  Pericardium: Normal thickness with no significant effusion or calcium present.  Cardiac valves: The aortic valve is trileaflet without significant calcification. The  mitral valve is normal structure without significant calcification.  Aorta: Normal caliber with no significant disease.  Extra-cardiac findings: See attached radiology report for non-cardiac structures.  IMPRESSION: 1. Coronary calcium score of 0. This was 0 percentile for age-, sex, and race-matched controls.  2. Normal coronary origin with right dominance.  3. Normal coronary arteries.  CAD RADS 0.  RECOMMENDATIONS: 1. CAD-RADS 0: No evidence of CAD (0%). Consider non-atherosclerotic causes of chest pain.  2. CAD-RADS 1: Minimal non-obstructive CAD (0-24%). Consider non-atherosclerotic causes of chest pain. Consider preventive therapy and risk factor modification.  3. CAD-RADS 2: Mild non-obstructive CAD (25-49%). Consider non-atherosclerotic causes of chest pain. Consider preventive therapy and risk factor modification.  4. CAD-RADS 3: Moderate stenosis. Consider symptom-guided anti-ischemic pharmacotherapy as well as risk factor modification per guideline directed care. Additional analysis with CT FFR will be submitted.  5. CAD-RADS 4: Severe stenosis. (70-99% or >  50% left main). Cardiac catheterization or CT FFR is recommended. Consider symptom-guided anti-ischemic pharmacotherapy as well as risk factor modification per guideline directed care. Invasive coronary angiography recommended with revascularization per published guideline statements.  6. CAD-RADS 5: Total coronary occlusion (100%). Consider cardiac catheterization or viability assessment. Consider symptom-guided anti-ischemic pharmacotherapy as well as risk factor modification per guideline directed care.  7. CAD-RADS N: Non-diagnostic study. Obstructive CAD can't be excluded. Alternative evaluation is recommended.  Armanda Magic, MD   Electronically Signed By: Armanda Magic M.D. On: 11/03/2020 21:48  Narrative EXAM: OVER-READ INTERPRETATION  CT CHEST  The following report is an over-read performed by radiologist Dr. Richarda Overlie of [facility] on 11/03/2020. This over-read does not include interpretation of cardiac or coronary anatomy or pathology. The coronary calcium score/coronary CTA interpretation by the cardiologist is attached.  COMPARISON:  None.  FINDINGS: Vascular: Normal caliber of the visualized thoracic aorta.  Mediastinum/Nodes: Visualized mediastinal structures are normal.  Lungs/Pleura: 4 mm nodule at the left lung base on sequence 11, image 49. No pleural effusions. No large areas of airspace disease or lung consolidation.  Upper Abdomen: Left hepatic lobe may be prominent but this is incompletely evaluated.  Musculoskeletal: No acute bone abnormality.  IMPRESSION: 1. No acute extracardiac findings. 2. Indeterminate 4 mm nodule at the left lung base. No follow-up needed if patient is low-risk. Non-contrast chest CT can be considered in 12 months if patient is high-risk. This recommendation follows the consensus statement: Guidelines for Management of Incidental Pulmonary Nodules Detected on CT Images: From the Fleischner Society 2017;  Radiology 2017; 284:228-243.  Electronically Signed: By: Richarda Overlie M.D. On: 11/03/2020 16:58            Physical Exam:    VS:  BP 122/78   Pulse 72   Ht 5\' 5"  (1.651 m)   Wt 275 lb (124.7 kg)   SpO2 96%   BMI 45.76 kg/m     Wt Readings from Last 3 Encounters:  07/19/22 275 lb (124.7 kg)  05/17/22 271 lb 3.2 oz (123 kg)  03/09/22 265 lb (120.2 kg)    GEN: NAD, Morbid obesity HEENT: Normal NECK: No JVD CARDIAC: RRR, no murmurs, rubs, gallops RESPIRATORY:  Clear to auscultation without rales, wheezing or rhonchi  ABDOMEN: Soft, non-tender, non-distended MUSCULOSKELETAL:  trace bilateral edema; No deformity  SKIN: Warm and dry NEUROLOGIC:  Alert and oriented x 3 PSYCHIATRIC:  Normal affect   ASSESSMENT:    1. Morbid obesity (HCC)   2. Physical deconditioning   3. Nonrheumatic mitral valve regurgitation  4. PAF (paroxysmal atrial fibrillation) (HCC)   5. Mixed hyperlipidemia     PLAN:    Morbid Obesity - this and decondition are likely contributors to her DOE - she does not meet criteria for GLP-1 coverage at this time  Exercise Prescription  Frequency: Daily Intensity:  60 to 90 percent of heart rate reserve. Type: Evening walking of her two dogs; we have given her resources related to at home Pilates; if she finds this useful would recommend in person classes as well for form Volume:  goal of 60 minute sessions for pilates Step goal: deferred- in the past tracking lead to anxiety without progress in health goals Progression:  gradually increase the intensity or duration of exercise. Limitations: Busy work schedule, not amenable to waking up early for exercise but can do evening exercise  Mild mitral regurgitation She does not have evidence of heart failure with preserved ejection fraction or exercise induced MR - stopping SGLT2i - removed HF from diagnosis list  Paroxysmal Atrial Fibrillation,  - Risk factors include HTN, OSA,  - CHADSVASC=2-3. -  HASBLED= 1 - Continue anticoagulation; Acquired Thrombophilia - with no evidence of mod/severe MS, rheumatic disease, or mechanical valve  imaging notable for mild left atrial dilation - discussed weight loss of 10% (BMI is greater than 27) as preventive factor - she has OSA; this is a component of her symptoms - Continue rate control with CCB for now  HLD - no CAC - discussed exercise in depth today  One year follow up with primary cardiologist   Medication Adjustments/Labs and Tests Ordered: Current medicines are reviewed at length with the patient today.  Concerns regarding medicines are outlined above.  No orders of the defined types were placed in this encounter.  No orders of the defined types were placed in this encounter.   Patient Instructions  Medication Instructions:  Your physician recommends that you continue on your current medications as directed. Please refer to the Current Medication list given to you today.  *If you need a refill on your cardiac medications before your next appointment, please call your pharmacy*   Lab Work: NONE If you have labs (blood work) drawn today and your tests are completely normal, you will receive your results only by: MyChart Message (if you have MyChart) OR A paper copy in the mail If you have any lab test that is abnormal or we need to change your treatment, we will call you to review the results.   Testing/Procedures: NONE   Follow-Up:With Dr. Izora Ribas as needed you will see Dr. Lynnette Caffey At [facility], you and your health needs are our priority.  As part of our continuing mission to provide you with exceptional heart care, we have created designated Provider Care Teams.  These Care Teams include your primary Cardiologist (physician) and Advanced Practice Providers (APPs -  Physician Assistants and Nurse Practitioners) who all work together to provide you with the care you need, when you need it.   Your next  appointment:   1 year(s)  Provider:   Alverda Skeans, MD       Signed, Christell Constant, MD  07/19/2022 10:01 AM    [facility]

## 2022-07-25 ENCOUNTER — Other Ambulatory Visit: Payer: Self-pay | Admitting: Family Medicine

## 2022-07-28 ENCOUNTER — Telehealth (HOSPITAL_COMMUNITY): Payer: Self-pay

## 2022-07-28 NOTE — Telephone Encounter (Signed)
Pt is not interested in the cardiac rehab program. Closed referral 

## 2022-08-19 ENCOUNTER — Other Ambulatory Visit: Payer: Self-pay | Admitting: Family Medicine

## 2022-09-03 ENCOUNTER — Encounter: Payer: Self-pay | Admitting: Family Medicine

## 2022-09-03 ENCOUNTER — Telehealth: Payer: BC Managed Care – PPO | Admitting: Medical

## 2022-09-03 VITALS — HR 89 | Wt 270.0 lb

## 2022-09-03 DIAGNOSIS — J988 Other specified respiratory disorders: Secondary | ICD-10-CM

## 2022-09-03 DIAGNOSIS — R0602 Shortness of breath: Secondary | ICD-10-CM | POA: Diagnosis not present

## 2022-09-03 DIAGNOSIS — R051 Acute cough: Secondary | ICD-10-CM | POA: Diagnosis not present

## 2022-09-03 DIAGNOSIS — J3489 Other specified disorders of nose and nasal sinuses: Secondary | ICD-10-CM

## 2022-09-03 DIAGNOSIS — Z79899 Other long term (current) drug therapy: Secondary | ICD-10-CM

## 2022-09-03 MED ORDER — AMOXICILLIN 875 MG PO TABS: 875.0000 mg | ORAL_TABLET | Freq: Two times a day (BID) | ORAL | 0 refills | Status: DC

## 2022-09-03 NOTE — Progress Notes (Signed)
Subjective:     Patient ID: Katherine Lewis, female   DOB: October 05, 1970, 52 y.o.   MRN: [id number]  This visit type was conducted due to national recommendations for restrictions regarding the COVID-19 Pandemic (e.g. social distancing) in an effort to limit this patient's exposure and mitigate transmission in our community.  Due to their co-morbid illnesses, this patient is at least at moderate risk for complications without adequate follow up.  This format is felt to be most appropriate for this patient at this time.    Documentation for virtual audio and video telecommunications through [facility] encounter:  The patient was located at home. The provider was located in the office. The patient did consent to this visit and is aware of possible charges through their insurance for this visit.  The other persons participating in this telemedicine service were none. Time spent on call was 20 minutes and in review of previous records 20 minutes total.  This virtual service is not related to other E/M service within previous 7 days.   HPI Chief Complaint  Patient presents with   Sore Throat    Sore throat, HA, body aches, fever last night, blood mucous in nose, cough, symptoms started Tuesday, home covid test was negative this morning   Virtual consult for illness.  She notes 3 day hx/o symptoms.  She reports sore throat, body aches, headache, subjective fever, hot and cold feeling, some cough.  Cough a lot day 1, and now occasional.  Has some blood tinged mucous from sinuses.  Has some SOB, but no wheezing.  But has had SOB issues for over a year, and has seen cardiology.  No sick contacts, but has been around a lot of people at college open house this past week.     Had negative covid home test today.  Using tylenol.    No other aggravating or relieving factors. No other complaint.   Past Medical History:  Diagnosis Date   Anxiety    Depression    GERD (gastroesophageal reflux disease)     Hypertension    Hypothyroidism    Insomnia    Plantar fasciitis    Sleep apnea    does not wear Cpap   Vitamin D deficiency    Current Outpatient Medications on File Prior to Visit  Medication Sig Dispense Refill   diltiazem (CARDIZEM CD) 180 MG 24 hr capsule Take 1 capsule (180 mg total) by mouth daily. 90 capsule 3   ELIQUIS 5 MG TABS tablet TAKE ONE TABLET BY MOUTH TWICE DAILY 180 tablet 1   escitalopram (LEXAPRO) 5 MG tablet TAKE ONE TABLET BY MOUTH ONCE DAILY 90 tablet 1   levonorgestrel (MIRENA) 20 MCG/24HR IUD 1 each by Intrauterine route once. Reported on 06/23/2015     pantoprazole (PROTONIX) 40 MG tablet TAKE ONE TABLET BY MOUTH ONCE DAILY 30 tablet 0   No current facility-administered medications on file prior to visit.     Review of Systems As in subjective    Objective:   Physical Exam Due to coronavirus pandemic stay at home measures, patient visit was virtual and they were not examined in person.    Pulse 89   Wt 270 lb (122.5 kg)   BMI 44.93 kg/m   Gen: wd, wn, nad Somewhat congested sounding but no labored breathing or wheezing      Assessment:     Encounter Diagnoses  Name Primary?   Acute cough Yes   Respiratory tract infection    Purulent nasal  discharge    High risk medication use    Shortness of breath        Plan:     Negative COVID test at home.  We discussed possible differential.  Advise she continue good hydration, rest, Tylenol for as needed for fever or not feeling well.  She is on Eliquis which limits other medications.  Advise she begin Mucinex DM.  Consider nasal saline and salt water gargles.  If not seeing improvement within the next 48 hours can begin amoxicillin.  We discussed her shortness of breath concern for the past year.  She had a stress test recently that help rule out a cardiac source.  I advise she come back and talk to her PCP about additional evaluation which could include some additional blood work to evaluate  thyroid, evaluate for anemia, consider PFT, or other eval.  Tamila was seen today for sore throat.  Diagnoses and all orders for this visit:  Acute cough  Respiratory tract infection  Purulent nasal discharge  High risk medication use  Shortness of breath  Other orders -     amoxicillin (AMOXIL) 875 MG tablet; Take 1 tablet (875 mg total) by mouth 2 (two) times daily.    F/u prn

## 2022-09-06 ENCOUNTER — Other Ambulatory Visit: Payer: Self-pay | Admitting: Medical

## 2022-09-06 MED ORDER — BENZONATATE 200 MG PO CAPS: 200.0000 mg | ORAL_CAPSULE | Freq: Three times a day (TID) | ORAL | 0 refills | Status: DC | PRN

## 2022-09-06 MED ORDER — FLUCONAZOLE 150 MG PO TABS: 150.0000 mg | ORAL_TABLET | ORAL | 0 refills | Status: DC

## 2022-09-08 ENCOUNTER — Other Ambulatory Visit: Payer: Self-pay | Admitting: Medical

## 2022-09-08 MED ORDER — HYDROCODONE BIT-HOMATROP MBR 5-1.5 MG/5ML PO SOLN: 5.0000 mL | Freq: Three times a day (TID) | ORAL | 0 refills | Status: AC | PRN

## 2022-09-08 MED ORDER — PROMETHAZINE-DM 6.25-15 MG/5ML PO SYRP: 5.0000 mL | ORAL_SOLUTION | Freq: Four times a day (QID) | ORAL | 0 refills | Status: DC | PRN

## 2022-09-21 ENCOUNTER — Other Ambulatory Visit: Payer: Self-pay | Admitting: Family Medicine

## 2022-09-21 DIAGNOSIS — F341 Dysthymic disorder: Secondary | ICD-10-CM

## 2022-09-21 NOTE — Telephone Encounter (Signed)
Is this okay to refill? 

## 2022-09-22 ENCOUNTER — Encounter: Payer: Self-pay | Admitting: Family Medicine

## 2022-09-22 ENCOUNTER — Ambulatory Visit: Payer: BC Managed Care – PPO | Admitting: Family Medicine

## 2022-09-22 VITALS — BP 140/80 | HR 75 | Temp 98.1°F | Ht 65.0 in | Wt 278.6 lb

## 2022-09-22 DIAGNOSIS — E669 Obesity, unspecified: Secondary | ICD-10-CM | POA: Diagnosis not present

## 2022-09-22 DIAGNOSIS — I480 Paroxysmal atrial fibrillation: Secondary | ICD-10-CM

## 2022-09-22 DIAGNOSIS — Z8616 Personal history of COVID-19: Secondary | ICD-10-CM

## 2022-09-22 DIAGNOSIS — I340 Nonrheumatic mitral (valve) insufficiency: Secondary | ICD-10-CM

## 2022-09-22 DIAGNOSIS — G4730 Sleep apnea, unspecified: Secondary | ICD-10-CM | POA: Diagnosis not present

## 2022-09-22 DIAGNOSIS — R0602 Shortness of breath: Secondary | ICD-10-CM | POA: Diagnosis not present

## 2022-09-22 DIAGNOSIS — J029 Acute pharyngitis, unspecified: Secondary | ICD-10-CM

## 2022-09-22 LAB — POCT RAPID STREP A (OFFICE): Rapid Strep A Screen: NEGATIVE

## 2022-09-22 NOTE — Progress Notes (Signed)
   Subjective:    Patient ID: Katherine Lewis, female    DOB: [birth year], 52 y.o.   MRN: [id number]  HPI She is here for consult concerning continued difficulty with shortness of breath.  She does have underlying mitral regurg as well as atrial fibs and is on diltiazem as well as Eliquis.  She has been evaluated by cardiology.  She also has OSA and is on CPAP and has been getting good readouts on that.  She has had COVID in the past and also most recently.  She thinks that the shortness of breath actually got worse with her first COVID.  She does complain of a 3-week history of bilateral ear pain as well as some slight sore throat.  She has had obesity issues her entire life and did use Wegovy but unfortunately it caused GI symptoms.   Review of Systems     Objective:    Physical Exam Alert and in no distress.  Both tympanic membranes normal.  Throat is clear.  Neck is supple without adenopathy.  Cardiac exam does show a grade 1/6 systolic murmur.  Lungs are clear to auscultation.  Strep screen       Assessment & Plan:   Problem List Items Addressed This Visit     Morbid obesity (HCC)   Nonrheumatic mitral valve regurgitation   RESOLVED: Obesity (BMI 35.0-39.9 without comorbidity)   PAF (paroxysmal atrial fibrillation) (HCC)   Severe sleep apnea   Other Visit Diagnoses     Shortness of breath    -  Primary   Relevant Orders   Ambulatory referral to Pulmonology   History of COVID-19       Relevant Orders   Ambulatory referral to Pulmonology   Sore throat       Relevant Orders   Rapid Strep A     I will refer her to pulmonary to look into any issues that might be going on there before we label this is just long COVID related.  She will continue on her CPAP since she seems to be doing fairly well on that.  Continue Cardizem and Eliquis.  She is to check with your insurance to see if it will cover Zepbound and if not I can possibly refer her to research company that is doing  research on another GLP-1.

## 2022-09-27 ENCOUNTER — Encounter: Payer: Self-pay | Admitting: Family Medicine

## 2022-10-11 ENCOUNTER — Encounter: Payer: Self-pay | Admitting: Family Medicine

## 2022-10-21 ENCOUNTER — Other Ambulatory Visit: Payer: Self-pay | Admitting: Family Medicine

## 2022-11-25 ENCOUNTER — Other Ambulatory Visit: Payer: Self-pay | Admitting: Family Medicine

## 2022-12-19 ENCOUNTER — Other Ambulatory Visit: Payer: Self-pay | Admitting: Family Medicine

## 2022-12-19 DIAGNOSIS — F341 Dysthymic disorder: Secondary | ICD-10-CM

## 2023-01-04 ENCOUNTER — Other Ambulatory Visit: Payer: Self-pay | Admitting: Internal Medicine

## 2023-01-04 ENCOUNTER — Encounter: Payer: Self-pay | Admitting: Internal Medicine

## 2023-01-04 ENCOUNTER — Ambulatory Visit: Payer: BC Managed Care – PPO | Admitting: Internal Medicine

## 2023-01-04 VITALS — BP 176/93 | HR 79 | Ht 65.0 in | Wt 281.1 lb

## 2023-01-04 DIAGNOSIS — G4733 Obstructive sleep apnea (adult) (pediatric): Secondary | ICD-10-CM

## 2023-01-04 DIAGNOSIS — R0600 Dyspnea, unspecified: Secondary | ICD-10-CM | POA: Diagnosis not present

## 2023-01-04 DIAGNOSIS — I4891 Unspecified atrial fibrillation: Secondary | ICD-10-CM

## 2023-01-04 DIAGNOSIS — R0602 Shortness of breath: Secondary | ICD-10-CM

## 2023-01-04 DIAGNOSIS — I480 Paroxysmal atrial fibrillation: Secondary | ICD-10-CM

## 2023-01-04 NOTE — Progress Notes (Signed)
Katherine Lewis    [id number]    May 22, 1970  Primary Care Physician:Lalonde, Everardo All, MD  Referring Physician: Ronnald Nian, MD [address] [place],  [place] [address] Reason for Consultation: shortness of breath Date of Consultation: 01/04/2023  Chief complaint:   Chief Complaint  Patient presents with   Consult    Sob, ongoing  for over a year. No inhaler usage. Pt on cpap      HPI: Katherine Lewis is 52 y.o. woman who presents for new patient evaluation for shortness of breath x 2 year.   She initially was attributing symptoms to being overweight but felt that the weight preceded her dyspnea.   She has dyspnea with minimal exertion such as climbing one flight of stairs, taking out the trash cans to her driveway. She is also now feeling deconditioned due to lack of exercise. She cannot walk her dogs due to dyspnea.   She does have a nonproductive dry cough. Cough is worse with walking/activity, also with talking, cough is not worse at night.  No chest tightness or wheezing.    She also has OSA and started CPAP two years ago.   Has been on cardizem for 2-3 years. (Was on losartan previously.)Took her meds today but her BP was 176. She also has a. Fib on eliquis. Had covid in 2021, was diagnosed with A. Fib after this. Stress test negative for ischemia in June 2024.   Gets bronchitis about once a year usually treated with abx, never been given prednisone.   DME - [facility]  Social history:  Occupation: she works as the Public house manager of the [employer] Exposures: live at home with her spouse. 2 dogs.  Smoking history: smoked 1/2 ppd in college for 5-6 years until age 34.   Social History   Occupational History   Occupation: Wellsite geologist: [employer]  Tobacco Use   Smoking status: Former    Current packs/day: 0.00    Average packs/day: 0.5 packs/day for 6.0 years (3.0 ttl pk-yrs)    Types: Cigarettes    Start date: 01/18/1986     Quit date: 01/19/1992    Years since quitting: 30.9   Smokeless tobacco: Never   Tobacco comments:    social   Vaping Use   Vaping status: Never Used  Substance and Sexual Activity   Alcohol use: Yes    Comment: social    Drug use: No   Sexual activity: Yes    Birth control/protection: I.U.D.    Comment: Mirena IUD    Relevant family history:  Family History  Problem Relation Age of Onset   Diabetes type II Mother    Diabetes Mother    COPD Maternal Grandmother    Lung cancer Paternal Grandmother    Lung cancer Paternal Grandfather     Past Medical History:  Diagnosis Date   Anxiety    Depression    GERD (gastroesophageal reflux disease)    Hypertension    Hypothyroidism    Insomnia    Plantar fasciitis    Sleep apnea    does not wear Cpap   Vitamin D deficiency     Past Surgical History:  Procedure Laterality Date   LAPAROSCOPIC GASTRIC SLEEVE RESECTION N/A 04/23/2014   Procedure: LAPAROSCOPIC GASTRIC SLEEVE RESECTION WITH UPPER ENDOSCOPY;  Surgeon: Luretha Murphy, MD;  Location: [facility];  Service: General;  Laterality: N/A;   MYRINGOTOMY     bilateral  TOTAL KNEE ARTHROPLASTY Right 01/02/2019   Procedure: RIGHT TOTAL KNEE ARTHROPLASTY;  Surgeon: Cammy Copa, MD;  Location: [facility] OR;  Service: Orthopedics;  Laterality: Right;     Physical Exam: Blood pressure (!) 176/93, pulse 79, height 5\' 5"  (1.651 m), weight 281 lb 1.6 oz (127.5 kg), SpO2 94%. Gen:      No acute distress, obese ENT:  enlarged tonsils, no nasal polyps, mucus membranes moist Lungs:    No increased respiratory effort, symmetric chest wall excursion, clear to auscultation bilaterally, no wheezes or crackles CV:         Regular rate and rhythm; no murmurs, rubs, or gallops.  No pedal edema Abd:      + bowel sounds; soft, non-tender; no distension MSK: no acute synovitis of DIP or PIP joints, no mechanics hands.  Skin:      Warm and dry; no rashes Neuro: normal speech, no focal facial  asymmetry Psych: alert and oriented x3, normal mood and affect   Data Reviewed/Medical Decision Making:  Independent interpretation of tests: Imaging:  Review of patient's CTPE 2023 images revealed no PE, normal lung parenchyma. The patient's images have been independently reviewed by me.    PFTs: I have personally reviewed the patient's PFTs and      No data to display          Labs:  Lab Results  Component Value Date   WBC 4.5 06/15/2021   HGB 13.9 06/15/2021   HCT 40.2 06/15/2021   MCV 93.3 06/15/2021   PLT 142 (L) 06/15/2021   Lab Results  Component Value Date   NA 141 06/11/2022   K 4.1 06/11/2022   CO2 25 06/11/2022   GLUCOSE 91 06/11/2022   BUN 11 06/11/2022   CREATININE 0.79 06/11/2022   CALCIUM 9.0 06/11/2022   EGFR 91 06/11/2022   GFRNONAA >60 06/15/2021     Immunization status:  Immunization History  Administered Date(s) Administered   Influenza Whole 10/19/2011   Influenza,inj,Quad PF,6+ Mos 10/05/2015, 10/07/2017, 10/06/2021   Influenza,inj,quad, With Preservative 10/19/2014, 10/25/2018   Influenza-Unspecified 10/12/2014, 10/05/2015, 10/25/2018, 10/18/2019, 10/02/2020   Moderna Covid-19 Vaccine Bivalent Booster 4yrs & up 10/06/2021   PFIZER Comirnaty(Gray Top)Covid-19 Tri-Sucrose Vaccine 10/04/2020   PFIZER(Purple Top)SARS-COV-2 Vaccination 03/22/2019, 04/13/2019, 10/23/2019   Tdap 08/20/2012   Unspecified SARS-COV-2 Vaccination 03/22/2019, 04/13/2019, 10/23/2019     I reviewed prior external note(s) from cardiology  I reviewed the result(s) of the labs and imaging as noted above.   I have ordered PFT   Assessment:  Dyspnea OSA on CPAP  Plan/Recommendations:  I have requested CPAP download from [facility].  Differential diagnosis for her symptoms includes deconditioning, poorly controlled OSA, CCB adverse effect, atrial fibrillation with high burden, chronotropic incompetence. I will obtain PFTs to rule out pulmonary disease- this does  not sound like asthma.  I will see her back after this.  We discussed disease management and progression at length today.   Return to Care: Return in about 2 months (around 03/07/2023).  Durel Salts, MD Pulmonary and Critical Care Medicine [facility] Office:[phone]  CC: Ronnald Nian, MD

## 2023-01-04 NOTE — Telephone Encounter (Signed)
Prescription refill request for Eliquis received. Indication:afib Last office visit:7/24 Scr:0.79  5/24 Age: 52 Weight:126.4  kg  Prescription refilled

## 2023-01-04 NOTE — Patient Instructions (Addendum)
It was a pleasure to see you today!  Please schedule follow up scheduled with myself in 2 months - after your PFTs. This can be virtual.  If my schedule is not open yet, we will contact you with a reminder closer to that time. Please call [phone] if you haven't heard from [place] a month before, and always call us sooner if issues or concerns arise. You can also send [place] a message through MyChart, but but aware that this is not to be used for urgent issues and it may take up to 5-7 days to receive a reply. Please be aware that you will likely be able to view your results before I have a chance to respond to them. Please give [place] 5 business days to respond to any non-urgent results.    Before your next visit I would like you to have: Full set of PFTs - 1 hour

## 2023-01-25 ENCOUNTER — Other Ambulatory Visit: Payer: Self-pay | Admitting: Obstetrics and Gynecology

## 2023-01-25 DIAGNOSIS — Z1231 Encounter for screening mammogram for malignant neoplasm of breast: Secondary | ICD-10-CM

## 2023-01-28 ENCOUNTER — Other Ambulatory Visit: Payer: Self-pay | Admitting: Physician Assistant

## 2023-01-31 ENCOUNTER — Encounter: Payer: Self-pay | Admitting: Internal Medicine

## 2023-02-02 ENCOUNTER — Telehealth: Payer: Self-pay | Admitting: Pulmonary Disease

## 2023-02-02 DIAGNOSIS — G4733 Obstructive sleep apnea (adult) (pediatric): Secondary | ICD-10-CM

## 2023-02-02 NOTE — Telephone Encounter (Signed)
 Patient needs a prescription sent to [email or web address] so that she can resective a backup cpap machine. AirSense 11, patient is not seeking her insurance to pay for it but is going to pay out of pocket so she has two, one for both of her houses.

## 2023-02-08 NOTE — Telephone Encounter (Signed)
Me and Celine Mans has dicussed this. Waiting for download form [facility],

## 2023-02-08 NOTE — Telephone Encounter (Signed)
Patient checking on message for CPAP machine. Patient phone number is [phone].

## 2023-02-09 ENCOUNTER — Encounter: Payer: Self-pay | Admitting: *Deleted

## 2023-02-09 NOTE — Telephone Encounter (Signed)
Compliance has been sent from [facility], will place order just need settings

## 2023-02-09 NOTE — Telephone Encounter (Signed)
CPAP download reviewed - 100% adherence at CPAP 15 cm H20, AHI 1.5. minimal leak.  Ok to prescribe CPAP at 15 cm h20 with humidification, mask, tubing and supplies per patient request.

## 2023-02-09 NOTE — Telephone Encounter (Signed)
I called and spoke with the pt  I have entered her prescription for CPAP as she requested to buy from [email or web address]  She asked that this be uploaded to mychart  This was done and nothing further needed

## 2023-03-01 ENCOUNTER — Ambulatory Visit
Admission: RE | Admit: 2023-03-01 | Discharge: 2023-03-01 | Discharge status: Home / Self Care | Payer: 59 | Source: Ambulatory Visit | Attending: Obstetrics and Gynecology | Admitting: Obstetrics and Gynecology

## 2023-03-01 DIAGNOSIS — Z1231 Encounter for screening mammogram for malignant neoplasm of breast: Secondary | ICD-10-CM

## 2023-03-07 ENCOUNTER — Ambulatory Visit (INDEPENDENT_AMBULATORY_CARE_PROVIDER_SITE_OTHER): Payer: 59 | Admitting: Internal Medicine

## 2023-03-07 ENCOUNTER — Encounter: Payer: Self-pay | Admitting: Internal Medicine

## 2023-03-07 ENCOUNTER — Ambulatory Visit: Payer: 59 | Admitting: Pulmonary Disease

## 2023-03-07 VITALS — BP 120/74 | HR 69 | Ht 65.0 in | Wt 273.0 lb

## 2023-03-07 DIAGNOSIS — J398 Other specified diseases of upper respiratory tract: Secondary | ICD-10-CM

## 2023-03-07 DIAGNOSIS — G4733 Obstructive sleep apnea (adult) (pediatric): Secondary | ICD-10-CM

## 2023-03-07 DIAGNOSIS — R0609 Other forms of dyspnea: Secondary | ICD-10-CM

## 2023-03-07 DIAGNOSIS — I480 Paroxysmal atrial fibrillation: Secondary | ICD-10-CM | POA: Diagnosis not present

## 2023-03-07 DIAGNOSIS — R0602 Shortness of breath: Secondary | ICD-10-CM

## 2023-03-07 LAB — PULMONARY FUNCTION TEST
DL/VA % pred: 125 %
DL/VA: 5.32 ml/min/mmHg/L
DLCO cor % pred: 91 %
DLCO cor: 19.67 ml/min/mmHg
DLCO unc % pred: 91 %
DLCO unc: 19.67 ml/min/mmHg
FEF 25-75 Post: 1.77 L/s
FEF 25-75 Pre: 1.95 L/s
FEF2575-%Change-Post: -9 %
FEF2575-%Pred-Post: 64 %
FEF2575-%Pred-Pre: 71 %
FEV1-%Change-Post: -2 %
FEV1-%Pred-Post: 69 %
FEV1-%Pred-Pre: 71 %
FEV1-Post: 1.98 L
FEV1-Pre: 2.04 L
FEV1FVC-%Change-Post: -2 %
FEV1FVC-%Pred-Pre: 101 %
FEV6-%Change-Post: 0 %
FEV6-%Pred-Post: 71 %
FEV6-%Pred-Pre: 71 %
FEV6-Post: 2.52 L
FEV6-Pre: 2.52 L
FEV6FVC-%Pred-Post: 102 %
FEV6FVC-%Pred-Pre: 102 %
FVC-%Change-Post: 0 %
FVC-%Pred-Post: 69 %
FVC-%Pred-Pre: 69 %
FVC-Post: 2.52 L
FVC-Pre: 2.52 L
Post FEV1/FVC ratio: 79 %
Post FEV6/FVC ratio: 100 %
Pre FEV1/FVC ratio: 81 %
Pre FEV6/FVC Ratio: 100 %
RV % pred: 92 %
RV: 1.74 L
TLC % pred: 82 %
TLC: 4.29 L

## 2023-03-07 NOTE — Patient Instructions (Signed)
 Full PFT performed today.

## 2023-03-07 NOTE — Patient Instructions (Addendum)
It was a pleasure to see you today!  Please schedule follow up scheduled with myself in 1 year.  If my schedule is not open yet, we will contact you with a reminder closer to that time. Please call [phone] if you haven't heard from [place] a month before, and always call us sooner if issues or concerns arise. You can also send [place] a message through MyChart, but but aware that this is not to be used for urgent issues and it may take up to 5-7 days to receive a reply. Please be aware that you will likely be able to view your results before I have a chance to respond to them. Please give [place] 5 business days to respond to any non-urgent results.    Sleep apnea looks great - continue CPAP therapy.  I suspect your shortness of breath and coughing related to exertion is secondary to some collapse of the trachea as we discussed.  Try some slow controlled breathing when symptoms occur - focus on exhaling through pursed lips as if you are blowing out candles.   I recommend a regular exercise routine - walking, recumbent/stationary bike are good places to start.   Call me sooner if needed.

## 2023-03-07 NOTE — Progress Notes (Signed)
 Full PFT performed today.

## 2023-03-07 NOTE — Progress Notes (Signed)
Katherine Lewis    [id number]    08-Feb-1970  Primary Care Physician:Lalonde, Everardo All, MD Date of Appointment: 03/07/2023 Established Patient Visit  Chief complaint:   Chief Complaint  Patient presents with   Shortness of Breath     HPI: Katherine Lewis is a 53 y.o. woman with history of OSA on CPAP and dyspnea. Additional history of paroxysmal Atrial fibrillation on eliquis and CCB.   Interval Updates:  Here for follow up after PFTS. Continues to have shortness of breath. Has coughing with exerting energy. She denies wheezing, chest tightness. She does have palpitations when she has these symptoms. She did a 2 week heart monitor - no episodes of palpitations correlating with her symptoms.   CPAP download reviewed - 100% adherence with AHI <1.5 residual. Acceptable leak  She stopped drinking alcohol in January. She feels less groggy.   Denies improvement with albuterol inhaler.   Symptoms of dyspnea started in 2021 after a covid   She has tried SGL-2i but had adverse effects without weight loss. (Abdominal pain.)   I have reviewed the patient's family social and past medical history and updated as appropriate.   Past Medical History:  Diagnosis Date   Anxiety    Depression    GERD (gastroesophageal reflux disease)    Hypertension    Hypothyroidism    Insomnia    Plantar fasciitis    Sleep apnea    does not wear Cpap   Vitamin D deficiency     Past Surgical History:  Procedure Laterality Date   LAPAROSCOPIC GASTRIC SLEEVE RESECTION N/A 04/23/2014   Procedure: LAPAROSCOPIC GASTRIC SLEEVE RESECTION WITH UPPER ENDOSCOPY;  Surgeon: Luretha Murphy, MD;  Location: [facility];  Service: General;  Laterality: N/A;   MYRINGOTOMY     bilateral    TOTAL KNEE ARTHROPLASTY Right 01/02/2019   Procedure: RIGHT TOTAL KNEE ARTHROPLASTY;  Surgeon: Cammy Copa, MD;  Location: [facility] OR;  Service: Orthopedics;  Laterality: Right;    Family History  Problem Relation Age of  Onset   Diabetes type II Mother    Diabetes Mother    COPD Maternal Grandmother    Lung cancer Paternal Grandmother    Lung cancer Paternal Grandfather     Social History   Occupational History   Occupation: Wellsite geologist: [employer]  Tobacco Use   Smoking status: Former    Current packs/day: 0.00    Average packs/day: 0.5 packs/day for 6.0 years (3.0 ttl pk-yrs)    Types: Cigarettes    Start date: 01/18/1986    Quit date: 01/19/1992    Years since quitting: 31.1   Smokeless tobacco: Never   Tobacco comments:    social   Vaping Use   Vaping status: Never Used  Substance and Sexual Activity   Alcohol use: Yes    Comment: social    Drug use: No   Sexual activity: Yes    Birth control/protection: I.U.D.    Comment: Mirena IUD     Physical Exam: Blood pressure 120/74, pulse 69, height 5\' 5"  (1.651 m), weight 273 lb (123.8 kg), SpO2 94%.  Gen:      No acute distress, overweight ENT:  enlarged tonsils, no nasal polyps, mucus membranes moist Lungs:    No increased respiratory effort, symmetric chest wall excursion, clear to auscultation bilaterally, no wheezes or crackles CV:         Regular rate and rhythm; no murmurs, rubs, or  gallops.  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  PFTs:     Latest Ref Rng & Units 03/07/2023    8:49 AM  PFT Results  FVC-Pre L 2.52  P  FVC-Predicted Pre % 69  P  FVC-Post L 2.52  P  FVC-Predicted Post % 69  P  Pre FEV1/FVC % % 81  P  Post FEV1/FCV % % 79  P  FEV1-Pre L 2.04  P  FEV1-Predicted Pre % 71  P  FEV1-Post L 1.98  P  DLCO uncorrected ml/min/mmHg 19.67  P  DLCO UNC% % 91  P  DLCO corrected ml/min/mmHg 19.67  P  DLCO COR %Predicted % 91  P  DLVA Predicted % 125  P  TLC L 4.29  P  TLC % Predicted % 82  P  RV % Predicted % 92  P    P Preliminary result   I have personally reviewed the  patient's PFTs and they show normal pulmonary function.   Labs:  Immunization status: Immunization History  Administered Date(s) Administered   Influenza Whole 10/19/2011   Influenza,inj,Quad PF,6+ Mos 10/05/2015, 10/07/2017, 10/06/2021   Influenza,inj,quad, With Preservative 10/19/2014, 10/25/2018   Influenza-Unspecified 10/12/2014, 10/05/2015, 10/25/2018, 10/18/2019, 10/02/2020   Moderna Covid-19 Vaccine Bivalent Booster 67yrs & up 10/06/2021   PFIZER Comirnaty(Gray Top)Covid-19 Tri-Sucrose Vaccine 10/04/2020   PFIZER(Purple Top)SARS-COV-2 Vaccination 03/22/2019, 04/13/2019, 10/23/2019   Tdap 08/20/2012   Unspecified SARS-COV-2 Vaccination 03/22/2019, 04/13/2019, 10/23/2019    External Records Personally Reviewed: cardiology  Assessment:  Dyspnea on Exertion OSA on CPAP Tracheomalacia Paroxysmal AF, on eliquis and CCB  Plan/Recommendations:  Sleep apnea looks great - continue CPAP therapy.  I suspect your shortness of breath and coughing related to exertion is secondary to some collapse of the trachea as we discussed.   Try some slow controlled breathing when symptoms occur - focus on exhaling through pursed lips as if you are blowing out candles.   I recommend a regular exercise routine - walking, recumbent/stationary bike are good places to start.   Call me sooner if needed.   I spent 30 minutes in the care of this patient today including pre-charting, chart review, review of results, face-to-face care, coordination of care and communication with consultants etc.).    Return to Care: Return in about 1 year (around 03/06/2024).   Durel Salts, MD Pulmonary and Critical Care Medicine [facility] Office:[phone]

## 2023-03-15 ENCOUNTER — Encounter: Payer: Self-pay | Admitting: Internal Medicine

## 2023-03-18 LAB — EXTERNAL GENERIC LAB PROCEDURE: COLOGUARD: NEGATIVE

## 2023-03-18 LAB — COLOGUARD: COLOGUARD: NEGATIVE

## 2023-03-21 DIAGNOSIS — K219 Gastro-esophageal reflux disease without esophagitis: Secondary | ICD-10-CM | POA: Insufficient documentation

## 2023-03-22 ENCOUNTER — Encounter: Payer: Self-pay | Admitting: Family Medicine

## 2023-03-22 ENCOUNTER — Ambulatory Visit: Payer: BC Managed Care – PPO | Admitting: Family Medicine

## 2023-03-22 VITALS — BP 122/84 | HR 68 | Ht 64.0 in | Wt 274.4 lb

## 2023-03-22 DIAGNOSIS — Z131 Encounter for screening for diabetes mellitus: Secondary | ICD-10-CM

## 2023-03-22 DIAGNOSIS — I480 Paroxysmal atrial fibrillation: Secondary | ICD-10-CM

## 2023-03-22 DIAGNOSIS — Z96659 Presence of unspecified artificial knee joint: Secondary | ICD-10-CM

## 2023-03-22 DIAGNOSIS — Z9884 Bariatric surgery status: Secondary | ICD-10-CM

## 2023-03-22 DIAGNOSIS — R5381 Other malaise: Secondary | ICD-10-CM

## 2023-03-22 DIAGNOSIS — I10 Essential (primary) hypertension: Secondary | ICD-10-CM | POA: Diagnosis not present

## 2023-03-22 DIAGNOSIS — J398 Other specified diseases of upper respiratory tract: Secondary | ICD-10-CM

## 2023-03-22 DIAGNOSIS — E782 Mixed hyperlipidemia: Secondary | ICD-10-CM | POA: Diagnosis not present

## 2023-03-22 DIAGNOSIS — Z0000 Encounter for general adult medical examination without abnormal findings: Secondary | ICD-10-CM | POA: Diagnosis not present

## 2023-03-22 DIAGNOSIS — Z23 Encounter for immunization: Secondary | ICD-10-CM

## 2023-03-22 DIAGNOSIS — G4730 Sleep apnea, unspecified: Secondary | ICD-10-CM

## 2023-03-22 DIAGNOSIS — F341 Dysthymic disorder: Secondary | ICD-10-CM | POA: Diagnosis not present

## 2023-03-22 DIAGNOSIS — G4736 Sleep related hypoventilation in conditions classified elsewhere: Secondary | ICD-10-CM

## 2023-03-22 DIAGNOSIS — R7989 Other specified abnormal findings of blood chemistry: Secondary | ICD-10-CM

## 2023-03-22 DIAGNOSIS — R7303 Prediabetes: Secondary | ICD-10-CM

## 2023-03-22 DIAGNOSIS — K219 Gastro-esophageal reflux disease without esophagitis: Secondary | ICD-10-CM

## 2023-03-22 DIAGNOSIS — I340 Nonrheumatic mitral (valve) insufficiency: Secondary | ICD-10-CM

## 2023-03-22 MED ORDER — ESCITALOPRAM OXALATE 5 MG PO TABS: 5.0000 mg | ORAL_TABLET | Freq: Every day | ORAL | 3 refills | Status: AC

## 2023-03-22 NOTE — Progress Notes (Signed)
   Subjective:    Patient ID: Katherine Lewis, female    DOB: November 13, 1970, 53 y.o.   MRN: [id number]  HPI She is here for a complete examination.  She continues to have difficulty with DOE and has been seen by pulmonary.  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  She also has an underlying history of PAF and states that she has episodes 3-4 times per week.  She is on Eliquis and also taking Cartia for this.  Apparently they are considering doing an ablation.  She continues on Lexapro as well as Wellbutrin which psychologically seems to help her stay in a good frame of mind.  She is considering a job change.  She does have sleep apnea and is using CPAP with good results.  In the past she has tried a GLP-1 and is interested in trying this again.  Her reflux seems to be under good control.  She continues on losartan/HCTZ as well as [place].  Review of record does show a elevated TSH from 2015 and subsequent TSH being normal.  She also has a history of hyperlipidemia.  Her home life is stable.  Otherwise family and social history as well as health maintenance and immunizations was reviewed.   Review of Systems  All other systems reviewed and are negative.      Objective:    Physical Exam Alert and in no distress. Tympanic membranes and canals are normal. Pharyngeal area is normal. Neck is supple without adenopathy or thyromegaly. Cardiac exam shows a regular sinus rhythm without murmurs or gallops. Lungs are clear to auscultation. 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  Discussed possibly having an ablation and she will check into that.  Considering all the problems she is having I think that is a reasonable option.  Continue on CPAP.  Discussed using the PPI on an as needed basis.  She will check with her insurance to see if she has coverage for GLP-1 and if so I will place her on that.  Also gave information to [employer] concerning future weight loss studies.

## 2023-03-23 ENCOUNTER — Encounter: Payer: Self-pay | Admitting: Family Medicine

## 2023-03-23 LAB — CBC WITH DIFFERENTIAL/PLATELET
Basophils Absolute: 0 10*3/uL (ref 0.0–0.2)
Basos: 1 %
EOS (ABSOLUTE): 0.1 10*3/uL (ref 0.0–0.4)
Eos: 2 %
Hematocrit: 39.9 % (ref 34.0–46.6)
Hemoglobin: 13.2 g/dL (ref 11.1–15.9)
Immature Grans (Abs): 0 10*3/uL (ref 0.0–0.1)
Immature Granulocytes: 0 %
Lymphocytes Absolute: 1.8 10*3/uL (ref 0.7–3.1)
Lymphs: 27 %
MCH: 30.3 pg (ref 26.6–33.0)
MCHC: 33.1 g/dL (ref 31.5–35.7)
MCV: 92 fL (ref 79–97)
Monocytes Absolute: 0.4 10*3/uL (ref 0.1–0.9)
Monocytes: 6 %
Neutrophils Absolute: 4.2 10*3/uL (ref 1.4–7.0)
Neutrophils: 64 %
Platelets: 244 10*3/uL (ref 150–450)
RBC: 4.35 x10E6/uL (ref 3.77–5.28)
RDW: 12 % (ref 11.7–15.4)
WBC: 6.6 10*3/uL (ref 3.4–10.8)

## 2023-03-23 LAB — CMP14+EGFR
ALT: 18 IU/L (ref 0–32)
AST: 16 IU/L (ref 0–40)
Albumin: 4.2 g/dL (ref 3.8–4.9)
Alkaline Phosphatase: 79 IU/L (ref 44–121)
BUN/Creatinine Ratio: 12 (ref 9–23)
BUN: 9 mg/dL (ref 6–24)
Bilirubin Total: 0.4 mg/dL (ref 0.0–1.2)
CO2: 26 mmol/L (ref 20–29)
Calcium: 9.2 mg/dL (ref 8.7–10.2)
Chloride: 104 mmol/L (ref 96–106)
Creatinine, Ser: 0.78 mg/dL (ref 0.57–1.00)
Globulin, Total: 2.3 g/dL (ref 1.5–4.5)
Glucose: 93 mg/dL (ref 70–99)
Potassium: 4.3 mmol/L (ref 3.5–5.2)
Sodium: 144 mmol/L (ref 134–144)
Total Protein: 6.5 g/dL (ref 6.0–8.5)
eGFR: 91 mL/min/{1.73_m2} (ref >59)

## 2023-03-23 LAB — LIPID PANEL
Chol/HDL Ratio: 5.6 ratio — ABNORMAL HIGH (ref 0.0–4.4)
Cholesterol, Total: 213 mg/dL — ABNORMAL HIGH (ref 100–199)
HDL: 38 mg/dL — ABNORMAL LOW (ref >39)
LDL Chol Calc (NIH): 146 mg/dL — ABNORMAL HIGH (ref 0–99)
Triglycerides: 157 mg/dL — ABNORMAL HIGH (ref 0–149)
VLDL Cholesterol Cal: 29 mg/dL (ref 5–40)

## 2023-03-23 LAB — SPECIMEN STATUS REPORT

## 2023-03-23 LAB — TSH: TSH: 2.66 u[IU]/mL (ref 0.450–4.500)

## 2023-03-24 ENCOUNTER — Encounter: Payer: Self-pay | Admitting: Family Medicine

## 2023-03-27 ENCOUNTER — Encounter: Payer: Self-pay | Admitting: Internal Medicine

## 2023-05-23 ENCOUNTER — Other Ambulatory Visit: Payer: Self-pay | Admitting: Family Medicine

## 2023-05-24 ENCOUNTER — Telehealth: Payer: Self-pay | Admitting: Internal Medicine

## 2023-05-24 NOTE — Telephone Encounter (Signed)
  Per MyChart scheduling message:  Pt c/o of Chest Pain: STAT if active (IN THIS MOMENT) CP, including tightness, pressure, jaw pain, shoulder/upper arm/back pain, SOB, nausea, and vomiting.   1. Are you having chest pain right now (tightness, pressure, or discomfort)? The chest pain is daily and occurs multiple times per day.   2. Are you experiencing any other symptoms (ex. Shortness of breath, nausea, vomiting, sweating)?  I am experiencing shortness of breath and dizziness but not nausea.   3. How long have you been experiencing chest pain? This symptom is a continuation from the very first time I saw Dr. Amalia Badder (sp?).   4. Is your chest pain continuous or coming and going?  Coming and going.  The fluttering is also coming and going and often occurs in conjunction with the chest pain.   5. Have you taken Nitroglycerin ? No.

## 2023-05-24 NOTE — Telephone Encounter (Signed)
 Called patient and reviewed.  None of her symptoms reported are new or worse.  She said "i'm just getting tired of this and I think I want to come in and discuss a plan".   Scheduled with APP 06/16/23. Pt pleased with this plan.

## 2023-06-14 NOTE — Progress Notes (Unsigned)
 Cardiology Office Note    Date:  06/16/2023  ID:  Katherine Lewis, DOB 07/22/1970, MRN [id number] PCP:  Watson Hacking, MD  Cardiologist:  Jann Melody, MD  Electrophysiologist:  None   Chief Complaint: Follow up for palpitations   History of Present Illness: .    Katherine Lewis is a 53 y.o. female with visit-pertinent history of paroxysmal atrial fibrillation, hypertension, OSA.   Patient initially seen by Dr. Lorie Rook in 10/2020 at the referral of her PCP for recommendations regarding atrial fibrillation picked up on mobile phone monitor.  Coronary CTA on 11/03/2020 indicated a coronary calcium score of 0.  Cardiac monitor in 2022 that was worn for 9 days indicated an average heart rate of 87 bpm, ranging from 57 to 224 bpm.  Predominant underlying rhythm was sinus rhythm, atrial fibrillation occurred less than 1% burden ranging from 105 to 224 bpm, average 150 bpm, longest lasting 1 hour and 49 minutes with an average of 152 bpm.  Atrial fibrillation was detected within +/- 45 seconds of symptomatic patient's event.  She was started on Eliquis  5 mg twice daily and Cardizem  120 mg daily.  Echocardiogram on 12/08/2020 indicated LVEF of 60 to 65%, no RWMA, mild LVH, RV systolic function and size was normal, LA was mildly dilated, mitral valve was normal in structure with no evidence of regurgitation or stenosis, aortic valve regurgitation was not visualized and no stenosis present.  Patient presented of 01/2022 with reports of worsening shortness of breath and palpitations.  Herdiltiazem was increased to 180 mg daily.  Echocardiogram on 03/03/2022 indicated LVEF of 60 to 65%, no RWMA, diastolic parameters were normal, RV systolic function and size was normal, LA and RA were mildly dilated, there was mild mitral valve regurgitation with no evidence of stenosis, aortic valve sclerosis was present without any evidence of stenosis, there is borderline dilation of the ascending aorta measuring 37  mm.  Patient was seen by Dr. Paulita Boss on 05/17/22, patient reported intermittent chest pain that was not purely exertional.  She reported that since her coronary CTA she did not have any improvement or worsening.  She noted problems with her CPAP device, as well as worsening shortness of breath.  It was discussed starting on SGLT2, however this was discontinued.  Echo stress test was recommended, this was without significant MR or diastolic dysfunction, patient had a UTI and no improvement in symptoms on SGLT2 inhibitor.  It was felt that likely morbid obesity and deconditioning were likely contributors to her dyspnea on exertion.  It was noted she did not have evidence heart failure preserved ejection fraction or exercise-induced MR.  It was recommended that she follow-up with Dr. Lorie Rook in one year.   Today she presents regarding palpitations, chest discomfort and increased shortness of breath.  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  Patient reports that she has shortness of breath with exertion that results in significant coughing, denies any wheezing.  She denies any chest pain with exertion.  She also notes intermittent chest discomfort that is above the left breast with no radiation and no association with exertion, notes, last a few hours.  She reports that this has been ongoing for the last year even prior to her last stress test.  Patient reports daily palpitations that feels like her prior atrial fibrillation that typically last under an hour, associated with a "weird" or fluttery sensation in her  chest, feels she is able to breath through the episodes.  Patient reports that her shortness of breath started following a severe case of COVID in 2021 and also notes that her weight is also likely contributing to shortness of breath however reports that she has been overweight for majority of her life and  did not have problems until recent years.  She reports that she did previously try a GLP-1 however this caused severe abdominal pain.  Labwork independently reviewed: 03/22/23: Sodium 144, calcium 4.3, creatinine 0.78, AST 16, ALT 18, hemoglobin 13.2, hematocrit 39.9 ROS: .   Today she denies fatigue, melena, hematuria, hemoptysis, diaphoresis, weakness, presyncope, syncope, orthopnea, and PND.  All other systems are reviewed and otherwise negative. Studies Reviewed: Aaron Aas    EKG:  EKG is ordered today, personally reviewed, demonstrating  EKG Interpretation Date/Time:  Thursday Jun 16 2023 08:45:03 EDT Ventricular Rate:  65 PR Interval:  172 QRS Duration:  88 QT Interval:  416 QTC Calculation: 432 R Axis:   49  Text Interpretation: Normal sinus rhythm Low voltage QRS No significant change since last tracing Confirmed by Matheau Orona [phone]) on 06/16/2023 5:28:44 PM   CV Studies: Cardiac studies reviewed are outlined and summarized above. Otherwise please see EMR for full report. Cardiac Studies & Procedures   ______________________________________________________________________________________________   STRESS TESTS  ECHOCARDIOGRAM STRESS TEST 07/15/2022  Narrative EXERCISE STRESS ECHO REPORT   --------------------------------------------------------------------------------  Patient Name:   Katherine Lewis  Date of Exam: 07/15/2022 Medical Rec #:  [id number]     Height:       65.0 in Accession #:    [id number]    Weight:       271.2 lb Date of Birth:  [birth year]    BSA:          2.251 m Patient Age:    51 years      BP:           168/101 mmHg Patient Gender: F             HR:           82 bpm. Exam Location:  [address]  Procedure: Stress Echo, 2D Echo, Limited Color Doppler and Cardiac Doppler  Indications:    Evaluation of LV function, mitral regurgitation and diastolic function I50.30 Dyspnea on exertion R06.09  History:        Patient has prior history of Echocardiogram  examinations. Previous echo revealed LVEF 65% with mild MR.  Sonographer:    Donnita Gales BA, RDCS Referring Phys: Watson Hacking   Conclusion(s)/Recommendation(s): Indication for stress test to assess increase in PASP with activity in the setting of MR. TR jet was not sufficient in exercise to assess change in PASP. No impairment in diastology with exercise.  FINDINGS  Exam Protocol: The patient exercised on a treadmill according to a Bruce protocol. Imaging not done in standard stress echocardiogram preset.   Patient Performance: The patient exercised for 4 minutes and 0 seconds, achieving 5.7 METS. The maximum stage achieved was I of the Bruce protocol. The baseline heart rate was 82 bpm. The heart rate at peak stress was 142 bpm. The target heart rate was calculated to be 143 bpm. The percentage of maximum predicted heart rate achieved was 84.3 %. The baseline blood pressure was 141/82 mmHg. The blood pressure at peak stress was 185/91 mmHg. The patient developed shortness of breath and fatigue during the stress exam. The symptoms resolved with rest.  EKG: The patient developed  no abnormal EKG findings during exercise.   Alois Arnt Electronically signed on 07/16/2022 at 7:58:33 AM      Final   ECHOCARDIOGRAM  ECHOCARDIOGRAM COMPLETE 03/03/2022  Narrative ECHOCARDIOGRAM REPORT    Patient Name:   Cathey Gottwald  Date of Exam: 03/03/2022 Medical Rec #:  [id number]     Height:       65.0 in Accession #:    [id number]    Weight:       267.6 lb Date of Birth:  1970/11/29    BSA:          2.239 m Patient Age:    51 years      BP:           138/86 mmHg Patient Gender: F             HR:           60 bpm. Exam Location:  [address]  Procedure: 2D Echo, Cardiac Doppler, Color Doppler and Strain Analysis  Indications:    R06.00 DOE  History:        Patient has prior history of Echocardiogram examinations, most recent 12/08/2020. Arrythmias:Atrial Fibrillation;  Risk Factors:Hypertension and Sleep Apnea.  Sonographer:    Juventino Oppenheim RCS Referring Phys: 95 TESSA N CONTE  IMPRESSIONS   1. Left ventricular ejection fraction, by estimation, is 60 to 65%. The left ventricle has normal function. The left ventricle has no regional wall motion abnormalities. Left ventricular diastolic parameters were normal. 2. Right ventricular systolic function is normal. The right ventricular size is normal. There is normal pulmonary artery systolic pressure. 3. Left atrial size was mildly dilated. 4. Right atrial size was mildly dilated. 5. A small pericardial effusion is present. The pericardial effusion is posterior to the left ventricle. 6. The mitral valve is normal in structure. Mild mitral valve regurgitation. No evidence of mitral stenosis. 7. The aortic valve is tricuspid. There is mild calcification of the aortic valve. Aortic valve regurgitation is not visualized. Aortic valve sclerosis/calcification is present, without any evidence of aortic stenosis. 8. Aortic dilatation noted. There is borderline dilatation of the ascending aorta, measuring 37 mm. 9. The inferior vena cava is normal in size with greater than 50% respiratory variability, suggesting right atrial pressure of 3 mmHg.  FINDINGS Left Ventricle: Left ventricular ejection fraction, by estimation, is 60 to 65%. The left ventricle has normal function. The left ventricle has no regional wall motion abnormalities. The left ventricular internal cavity size was normal in size. There is no left ventricular hypertrophy. Left ventricular diastolic parameters were normal.  Right Ventricle: The right ventricular size is normal. No increase in right ventricular wall thickness. Right ventricular systolic function is normal. There is normal pulmonary artery systolic pressure. The tricuspid regurgitant velocity is 2.03 m/s, and with an assumed right atrial pressure of 3 mmHg, the estimated right ventricular  systolic pressure is 19.5 mmHg.  Left Atrium: Left atrial size was mildly dilated.  Right Atrium: Right atrial size was mildly dilated.  Pericardium: A small pericardial effusion is present. The pericardial effusion is posterior to the left ventricle.  Mitral Valve: The mitral valve is normal in structure. Mild mitral valve regurgitation. No evidence of mitral valve stenosis.  Tricuspid Valve: The tricuspid valve is normal in structure. Tricuspid valve regurgitation is mild . No evidence of tricuspid stenosis.  Aortic Valve: The aortic valve is tricuspid. There is mild calcification of the aortic valve. Aortic valve regurgitation is not visualized. Aortic valve sclerosis/calcification  is present, without any evidence of aortic stenosis.  Pulmonic Valve: The pulmonic valve was normal in structure. Pulmonic valve regurgitation is not visualized. No evidence of pulmonic stenosis.  Aorta: Aortic dilatation noted. There is borderline dilatation of the ascending aorta, measuring 37 mm.  Venous: The inferior vena cava is normal in size with greater than 50% respiratory variability, suggesting right atrial pressure of 3 mmHg.  IAS/Shunts: No atrial level shunt detected by color flow Doppler.   LEFT VENTRICLE PLAX 2D LVIDd:         5.30 cm   Diastology LVIDs:         3.60 cm   LV e' medial:    7.62 cm/s LV PW:         1.30 cm   LV E/e' medial:  13.9 LV IVS:        1.00 cm   LV e' lateral:   13.90 cm/s LVOT diam:     1.90 cm   LV E/e' lateral: 7.6 LV SV:         82 LV SV Index:   37        2D Longitudinal Strain LVOT Area:     2.84 cm  2D Strain GLS (A2C):   -21.8 % 2D Strain GLS (A3C):   -27.0 % 2D Strain GLS (A4C):   -19.2 % 2D Strain GLS Avg:     -22.7 %  RIGHT VENTRICLE RV Basal diam:  3.10 cm RV S prime:     10.90 cm/s TAPSE (M-mode): 1.9 cm RVSP:           19.5 mmHg  LEFT ATRIUM             Index        RIGHT ATRIUM           Index LA diam:        4.70 cm 2.10 cm/m   RA  Pressure: 3.00 mmHg LA Vol (A2C):   62.1 ml 27.74 ml/m  RA Area:     14.90 cm LA Vol (A4C):   59.7 ml 26.67 ml/m  RA Volume:   38.40 ml  17.15 ml/m LA Biplane Vol: 60.2 ml 26.89 ml/m AORTIC VALVE LVOT Vmax:   129.00 cm/s LVOT Vmean:  80.700 cm/s LVOT VTI:    0.289 m  AORTA Ao Root diam: 3.00 cm Ao Asc diam:  3.70 cm  MITRAL VALVE                TRICUSPID VALVE MV Area (PHT):              TR Peak grad:   16.5 mmHg MV Decel Time:              TR Vmax:        203.00 cm/s MV E velocity: 106.00 cm/s  Estimated RAP:  3.00 mmHg MV A velocity: 88.00 cm/s   RVSP:           19.5 mmHg MV E/A ratio:  1.20 SHUNTS Systemic VTI:  0.29 m Systemic Diam: 1.90 cm  Jules Oar MD Electronically signed by Jules Oar MD Signature Date/Time: 03/03/2022/12:41:02 PM    Final    MONITORS  LONG TERM MONITOR (3-14 DAYS) 11/26/2020  Narrative Patch Wear Time:  9 days and 0 hours (2022-10-19T06:47:10-398 to 2022-10-28T07:01:44-0400)  Patient had a min HR of 57 bpm, max HR of 224 bpm, and avg HR of 87 bpm. Predominant underlying rhythm was Sinus Rhythm. Atrial Fibrillation occurred (<1%  burden), ranging from 105-224 bpm (avg of 152 bpm), the longest lasting 1 hour 49 mins with an avg rate of 152 bpm. Atrial Fibrillation was detected within +/- 45 seconds of symptomatic patient event(s). Isolated SVEs were rare (<1.0%), SVE Couplets were rare (<1.0%), and SVE Triplets were rare (<1.0%). Isolated VEs were rare (<1.0%), and no VE Couplets or VE Triplets were present.   CT SCANS  CT CORONARY MORPH W/CTA COR W/SCORE 11/03/2020  Addendum 11/03/2020  9:50 PM ADDENDUM REPORT: 11/03/2020 21:48  CLINICAL DATA:  Chest pain  EXAM: Cardiac/Coronary CTA  TECHNIQUE: A non-contrast, gated CT scan was obtained with axial slices of 3 mm through the heart for calcium scoring. Calcium scoring was performed using the Agatston method. A 120 kV prospective, gated, contrast cardiac scan was  obtained. Gantry rotation speed was 250 msecs and collimation was 0.6 mm. Two sublingual nitroglycerin  tablets (0.8 mg) were given. The 3D data set was reconstructed in 5% intervals of the 35-75% of the R-R cycle. Diastolic phases were analyzed on a dedicated workstation using MPR, MIP, and VRT modes. The patient received 95 cc of contrast.  FINDINGS: Image quality: Excellent.  Noise artifact is: Limited.  Coronary Arteries:  Normal coronary origin.  Right dominance.  Left main: The left main is a large caliber vessel with a normal take off from the left coronary cusp that bifurcates to form a left anterior descending artery and a left circumflex artery. There is no plaque or stenosis.  Left anterior descending artery: The LAD is patent without evidence of plaque or stenosis. The LAD gives off 2 patent diagonal branches.  Left circumflex artery: The LCX is non-dominant and patent with no evidence of plaque or stenosis. The LCX gives off 2 patent obtuse marginal branches.  Right coronary artery: The RCA is dominant with normal take off from the right coronary cusp. There is no evidence of plaque or stenosis. The RCA terminates as a PDA and right posterolateral branch without evidence of plaque or stenosis.  Right Atrium: Right atrial size is within normal limits.  Right Ventricle: The right ventricular cavity is within normal limits.  Left Atrium: Left atrial size is normal in size with no left atrial appendage filling defect.  Left Ventricle: The ventricular cavity size is within normal limits. There are no stigmata of prior infarction. There is no abnormal filling defect.  Pulmonary arteries: Normal in size without proximal filling defect.  Pulmonary veins: Normal pulmonary venous drainage.  Pericardium: Normal thickness with no significant effusion or calcium present.  Cardiac valves: The aortic valve is trileaflet without significant calcification. The mitral  valve is normal structure without significant calcification.  Aorta: Normal caliber with no significant disease.  Extra-cardiac findings: See attached radiology report for non-cardiac structures.  IMPRESSION: 1. Coronary calcium score of 0. This was 0 percentile for age-, sex, and race-matched controls.  2. Normal coronary origin with right dominance.  3. Normal coronary arteries.  CAD RADS 0.  RECOMMENDATIONS: 1. CAD-RADS 0: No evidence of CAD (0%). Consider non-atherosclerotic causes of chest pain.  2. CAD-RADS 1: Minimal non-obstructive CAD (0-24%). Consider non-atherosclerotic causes of chest pain. Consider preventive therapy and risk factor modification.  3. CAD-RADS 2: Mild non-obstructive CAD (25-49%). Consider non-atherosclerotic causes of chest pain. Consider preventive therapy and risk factor modification.  4. CAD-RADS 3: Moderate stenosis. Consider symptom-guided anti-ischemic pharmacotherapy as well as risk factor modification per guideline directed care. Additional analysis with CT FFR will be submitted.  5. CAD-RADS 4: Severe stenosis. (  70-99% or > 50% left main). Cardiac catheterization or CT FFR is recommended. Consider symptom-guided anti-ischemic pharmacotherapy as well as risk factor modification per guideline directed care. Invasive coronary angiography recommended with revascularization per published guideline statements.  6. CAD-RADS 5: Total coronary occlusion (100%). Consider cardiac catheterization or viability assessment. Consider symptom-guided anti-ischemic pharmacotherapy as well as risk factor modification per guideline directed care.  7. CAD-RADS N: Non-diagnostic study. Obstructive CAD can't be excluded. Alternative evaluation is recommended.  Gaylyn Keas, MD   Electronically Signed By: Gaylyn Keas M.D. On: 11/03/2020 21:48  Narrative EXAM: OVER-READ INTERPRETATION  CT CHEST  The following report is an over-read performed  by radiologist Dr. Elene Griffes of [facility] on 11/03/2020. This over-read does not include interpretation of cardiac or coronary anatomy or pathology. The coronary calcium score/coronary CTA interpretation by the cardiologist is attached.  COMPARISON:  None.  FINDINGS: Vascular: Normal caliber of the visualized thoracic aorta.  Mediastinum/Nodes: Visualized mediastinal structures are normal.  Lungs/Pleura: 4 mm nodule at the left lung base on sequence 11, image 49. No pleural effusions. No large areas of airspace disease or lung consolidation.  Upper Abdomen: Left hepatic lobe may be prominent but this is incompletely evaluated.  Musculoskeletal: No acute bone abnormality.  IMPRESSION: 1. No acute extracardiac findings. 2. Indeterminate 4 mm nodule at the left lung base. No follow-up needed if patient is low-risk. Non-contrast chest CT can be considered in 12 months if patient is high-risk. This recommendation follows the consensus statement: Guidelines for Management of Incidental Pulmonary Nodules Detected on CT Images: From the Fleischner Society 2017; Radiology 2017; 284:228-243.  Electronically Signed: By: Elene Griffes M.D. On: 11/03/2020 16:58     ______________________________________________________________________________________________       Current Reported Medications:.    Current Meds  Medication Sig   CARTIA  XT 180 MG 24 hr capsule Take 1 capsule (180 mg total) by mouth daily.   ELIQUIS  5 MG TABS tablet TAKE ONE TABLET BY MOUTH TWICE DAILY   escitalopram  (LEXAPRO ) 5 MG tablet Take 1 tablet (5 mg total) by mouth daily.   levonorgestrel (MIRENA) 20 MCG/24HR IUD 1 each by Intrauterine route once. Reported on 06/23/2015   pantoprazole  (PROTONIX ) 40 MG tablet TAKE ONE TABLET BY MOUTH ONCE DAILY    Physical Exam:    VS:  BP 128/74   Pulse 71   Ht 5\' 4"  (1.626 m)   Wt 282 lb (127.9 kg)   SpO2 94%   BMI 48.41 kg/m    Wt Readings from Last 3  Encounters:  06/16/23 282 lb (127.9 kg)  03/22/23 274 lb 6.4 oz (124.5 kg)  03/07/23 273 lb (123.8 kg)    GEN: Well nourished, well developed in no acute distress NECK: No JVD; No carotid bruits CARDIAC: RRR, no murmurs, rubs, gallops RESPIRATORY:  Clear to auscultation without rales, wheezing or rhonchi  ABDOMEN: Soft, non-tender, non-distended EXTREMITIES:  No edema; No acute deformity     Asessement and Plan:.    Atrial fibrillation: Patient with history of PAF, on diltiazem  and Eliquis .  Patient reports daily palpitations that feel like her prior atrial fibrillation, episodes typically last under an hour associated with a weird or fluttery sensation in her chest.  She reports adherence with diltiazem  and Eliquis , denies any significant bleeding.  Question if her A-fib burden has increased resulting in worsening shortness of breath.  Will have patient wear a 2-week cardiac monitor to reassess A-fib burden.  Hypertension: Initial blood pressure today 140/90, on  recheck was 128/74.  Continue Cartia  XT 180 mg daily.  Chest discomfort: Patient reports an intermittent chest discomfort that is above the left breast with no radiation and no association with exertion, last a few hours.  She reports that this has been ongoing for the last year even prior to her last stress test.  Echo stress last year was within normal limits.  Patient's coronary CTA in 10/2020 indicated a coronary calcium score of 0.  Low suspicion that this is cardiac in nature, if cardiac monitor is unrevealing or symptoms become more persistent with exertion may consider a cardiac PET stress.   OSA: Patient reports nightly CPAP compliance, she is followed by pulmonology.  Shortness of breath: Patient reports ongoing shortness of breath that has been overall unchanged in the last few years.  Patient had an echo stress test last year for similar ongoing symptoms that was within normal limits.  She appears leaving the, compensated  on exam.  Question if she is having more frequent episodes of atrial fibrillation contributing to her increased shortness of breath, she will wear cardiac monitor as noted above. 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  Mild mitral valve regurgitation: Noted on echo in 02/2022.  Stress echo did not show evidence of heart failure preserved ejection fraction or exercise-induced MR, was within normal limits.  Patient denies any change or worsening of symptoms since stress echo last year.  Lower extremity edema: Patient reports intermittent lower extremity edema that worsens throughout the day and typically improves overnight.  No edema appreciated today on exam.  She has previously been on SGLT2 inhibitor with no improvement in symptoms.    Disposition: F/u with Dr. Paulita Boss or Issabela Lesko, NP in three months.   Signed, Yarielys Beed D Abimbola Aki, NP

## 2023-06-16 ENCOUNTER — Ambulatory Visit

## 2023-06-16 ENCOUNTER — Encounter: Payer: Self-pay | Admitting: Cardiology

## 2023-06-16 ENCOUNTER — Ambulatory Visit: Attending: Cardiology | Admitting: Cardiology

## 2023-06-16 VITALS — BP 128/74 | HR 71 | Ht 64.0 in | Wt 282.0 lb

## 2023-06-16 DIAGNOSIS — R5381 Other malaise: Secondary | ICD-10-CM

## 2023-06-16 DIAGNOSIS — G4730 Sleep apnea, unspecified: Secondary | ICD-10-CM

## 2023-06-16 DIAGNOSIS — I340 Nonrheumatic mitral (valve) insufficiency: Secondary | ICD-10-CM

## 2023-06-16 DIAGNOSIS — R0609 Other forms of dyspnea: Secondary | ICD-10-CM | POA: Diagnosis not present

## 2023-06-16 DIAGNOSIS — I480 Paroxysmal atrial fibrillation: Secondary | ICD-10-CM

## 2023-06-16 DIAGNOSIS — E782 Mixed hyperlipidemia: Secondary | ICD-10-CM

## 2023-06-16 DIAGNOSIS — R600 Localized edema: Secondary | ICD-10-CM

## 2023-06-16 NOTE — Patient Instructions (Signed)
 Medication Instructions:  No changes *If you need a refill on your cardiac medications before your next appointment, please call your pharmacy*  Lab Work: No labs  Testing/Procedures: Katherine Lewis- Long Term Monitor Instructions  Your physician has requested you wear a ZIO patch monitor for 14 days.  This is a single patch monitor. [employer] supplies one patch monitor per enrollment. Additional stickers are not available. Please do not apply patch if you will be having a Nuclear Stress Test,  Echocardiogram, Cardiac CT, MRI, or Chest Xray during the period you would be wearing the  monitor. The patch cannot be worn during these tests. You cannot remove and re-apply the  ZIO XT patch monitor.  Your ZIO patch monitor will be mailed 3 day USPS to your address on file. It may take 3-5 days  to receive your monitor after you have been enrolled.  Once you have received your monitor, please review the enclosed instructions. Your monitor  has already been registered assigning a specific monitor serial # to you.  Billing and Patient Assistance Program Information  We have supplied [employer] with any of your insurance information on file for billing purposes. [employer] offers a sliding scale Patient Assistance Program for patients that do not have  insurance, or whose insurance does not completely cover the cost of the ZIO monitor.  You must apply for the Patient Assistance Program to qualify for this discounted rate.  To apply, please call [employer] at [phone], select option 4, select option 2, ask to apply for  Patient Assistance Program. Katherine Lewis will ask your household income, and how many people  are in your household. They will quote your out-of-pocket cost based on that information.  [employer] will also be able to set up a 4-month, interest-free payment plan if needed.  Applying the monitor   Shave hair from upper left chest.  Hold abrader disc by orange tab. Rub abrader in 40 strokes over the  upper left chest as  indicated in your monitor instructions.  Clean area with 4 enclosed alcohol pads. Let dry.  Apply patch as indicated in monitor instructions. Patch will be placed under collarbone on left  side of chest with arrow pointing upward.  Rub patch adhesive wings for 2 minutes. Remove white label marked "1". Remove the white  label marked "2". Rub patch adhesive wings for 2 additional minutes.  While looking in a mirror, press and release button in center of patch. A small green light will  flash 3-4 times. This will be your only indicator that the monitor has been turned on.  Do not shower for the first 24 hours. You may shower after the first 24 hours.  Press the button if you feel a symptom. You will hear a small click. Record Date, Time and  Symptom in the Patient Logbook.  When you are ready to remove the patch, follow instructions on the last 2 pages of Patient  Logbook. Stick patch monitor onto the last page of Patient Logbook.  Place Patient Logbook in the blue and white box. Use locking tab on box and tape box closed  securely. The blue and white box has prepaid postage on it. Please place it in the mailbox as  soon as possible. Your physician should have your test results approximately 7 days after the  monitor has been mailed back to [facility].  Call [facility] Customer Care at [phone] if you have questions regarding  your ZIO XT patch monitor. Call them immediately if you  see an orange light blinking on your  monitor.  If your monitor falls off in less than 4 days, contact our Monitor department at [phone].  If your monitor becomes loose or falls off after 4 days call [employer] at [phone] for  suggestions on securing your monitor  Follow-Up: At [facility], you and your health needs are our priority.  As part of our continuing mission to provide you with exceptional heart care, our providers are all part of one team.  This team  includes your primary Cardiologist (physician) and Advanced Practice Providers or APPs (Physician Assistants and Nurse Practitioners) who all work together to provide you with the care you need, when you need it.  Your next appointment:   3 month(s)  Provider:   Jann Melody, MD    We recommend signing up for the patient portal called "MyChart".  Sign up information is provided on this After Visit Summary.  MyChart is used to connect with patients for Virtual Visits (Telemedicine).  Patients are able to view lab/test results, encounter notes, upcoming appointments, etc.  Non-urgent messages can be sent to your provider as well.   To learn more about what you can do with MyChart, go to [email or web address].

## 2023-06-16 NOTE — Progress Notes (Unsigned)
 Enrolled patient for a 14 day Zio XT monitor to be mailed to patients home  Chandrasekhar to read

## 2023-07-06 ENCOUNTER — Other Ambulatory Visit: Payer: Self-pay | Admitting: Internal Medicine

## 2023-07-06 DIAGNOSIS — I4891 Unspecified atrial fibrillation: Secondary | ICD-10-CM

## 2023-07-06 NOTE — Telephone Encounter (Signed)
 Pt last saw Katlyn West, NP on 06/16/23, last labs 03/22/23 Creat 0.78, age 53, weight 127.9kg, based on specified criteria pt is on appropriate dosage of Eliquis  5mg  BID for afib.  Will refill rx.

## 2023-09-01 ENCOUNTER — Other Ambulatory Visit: Payer: Self-pay

## 2023-09-01 ENCOUNTER — Other Ambulatory Visit

## 2023-09-01 ENCOUNTER — Encounter: Payer: Self-pay | Admitting: Family Medicine

## 2023-09-01 ENCOUNTER — Ambulatory Visit: Payer: Self-pay | Admitting: Family Medicine

## 2023-09-01 ENCOUNTER — Telehealth (INDEPENDENT_AMBULATORY_CARE_PROVIDER_SITE_OTHER): Admitting: Family Medicine

## 2023-09-01 ENCOUNTER — Other Ambulatory Visit: Payer: Self-pay | Admitting: Family Medicine

## 2023-09-01 VITALS — Ht 65.0 in | Wt 280.0 lb

## 2023-09-01 DIAGNOSIS — R059 Cough, unspecified: Secondary | ICD-10-CM | POA: Diagnosis not present

## 2023-09-01 DIAGNOSIS — U071 COVID-19: Secondary | ICD-10-CM

## 2023-09-01 LAB — POC COVID19 BINAXNOW: SARS Coronavirus 2 Ag: POSITIVE — AB

## 2023-09-01 MED ORDER — NIRMATRELVIR/RITONAVIR (PAXLOVID)TABLET: 3.0000 | ORAL_TABLET | Freq: Two times a day (BID) | ORAL | 0 refills | Status: AC

## 2023-09-01 MED ORDER — NIRMATRELVIR/RITONAVIR (PAXLOVID)TABLET: 3.0000 | ORAL_TABLET | Freq: Two times a day (BID) | ORAL | 0 refills | Status: DC

## 2023-09-01 NOTE — Patient Instructions (Signed)
 Stay well hydrated. Take the paxlovid  as directed. Take mucinex  DM 12 hour twice daily to help thin out secretions and help with cough. You can use sinus rinses (with distilled or boiled water , not tap water ) as needed for nasal congestion and sinus pain. Continue tylenol  as needed for fever or pain. You are NOT supposed to be taking aleve or ibuprofen  when on eliquis , as this increases your bleeding risk.  Contact us  if your cough worsens. We discussed prescribing benzonatate  (200 mg three times daily, if needed), or potentially hydrocodone  just at bedtime if cough is severe and keeping you awake at night.  We discussed isolation recommendations--stay at home until you are without any fever for 24 hours, without using any fever-reducing medications (ie tylenol ), AND your respiratory symptoms are significantly better.  Once you leave isolation, you should continue to wear a mask until your respiratory symptoms resolve (ie keep wearing a mask while you're still coughing, but if you're coughing a LOT, stay home!)  Return for evaluation if you have persistent high fever, chest pain, shortness of breath, pain with breathing, or other new or worsening symptoms.  It was a pleasure meeting you.  I hope you feel better soon!

## 2023-09-01 NOTE — Progress Notes (Signed)
 Start time: 2:38 End time: 3pm Additional time spent in completing AVS after COVID tests back, and ordering meds.  Virtual Visit via Video Note  I connected with Katherine Lewis on 09/01/23 by a video enabled telemedicine application and verified that I am speaking with the correct person using two identifiers.  Location: Patient: her office Provider: office   I discussed the limitations of evaluation and management by telemedicine and the availability of in person appointments. The patient expressed understanding and agreed to proceed.  History of Present Illness:  Chief Complaint  Patient presents with   Cough    Extreme headache, exhausted, fatigue, covid like symptoms, noticed systems tuesday   She started feeling sick on Tues 8/12, with cough, ST, aching in neck Today she developed extreme HA body aches and feels very fatigued. She doesn't have a thermometer at work, feels like she has a low grade fever  Cough is nonproductive. Denies chest pain, shortness of breath. She has been sneezing, runny nose is clear. Her HA is all over, not a sinus headache.  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 No known direct COVID exposures.  OTC meds--she has taken Tylenol  and aleve, without much benefit. No other OTC meds.   PMH, PSH, SH reviewed  HTN, afib, on eliquis , OSA, mitral regurg  Outpatient Encounter Medications as of 09/01/2023  Medication Sig   CARTIA  XT 180 MG 24 hr capsule Take 1 capsule (180 mg total) by mouth daily.   ELIQUIS  5 MG TABS tablet TAKE ONE TABLET BY MOUTH TWICE DAILY   escitalopram  (LEXAPRO ) 5 MG tablet Take 1 tablet (5 mg total) by mouth daily.   levonorgestrel (MIRENA) 20 MCG/24HR IUD 1 each by Intrauterine route once. Reported on 06/23/2015   pantoprazole  (PROTONIX ) 40 MG tablet TAKE ONE TABLET BY MOUTH ONCE DAILY   No facility-administered encounter medications on file as of 09/01/2023.    Allergies  Allergen Reactions   Codeine Nausea Only  She has tolerated hydrocodone  syrup in the past.   ROS:  URI symptoms per HPI. No n/v/d, bleeding, bruising, rash, chest pain, shortness of breath. See HPI   Observations/Objective:  Ht 5' 5 (1.651 m)   Wt 280 lb (127 kg)   BMI 46.59 kg/m   Pleasant, well-appearing female, who appears tired, but in no distress. She is alert, no throat-clearing or coughing during visit, one sneeze. She is speaking comfortably. Cranial nerves are grossly intact. Normal speech, eye contact, grooming Exam is limited due to the virtual nature of the visit.  Patient came to office for COVID test--positive.   Assessment and Plan:  COVID-19 virus infection - Plan: nirmatrelvir /ritonavir  (PAXLOVID ) 20 x 150 MG & 10 x 100MG  TABS  Stay well hydrated. Take the paxlovid  as directed. Take mucinex  DM 12 hour twice daily to help thin out secretions and help with cough. You can use sinus rinses (with distilled or boiled water , not tap water ) as needed for nasal congestion and sinus pain. Continue tylenol  as needed for fever or pain. You are NOT supposed to be taking aleve or ibuprofen  when on eliquis , as this increases your bleeding risk.  Contact us  if your cough worsens. We discussed prescribing benzonatate  (200 mg three times daily, if needed), or potentially hydrocodone  just at bedtime if cough is severe and keeping you awake at night.  Return for evaluation if you have persistent high fever, chest pain, shortness of breath, pain with breathing, or  other new or worsening symptoms.   Follow Up Instructions:    I discussed the assessment and treatment plan with the patient. The patient was provided an opportunity to ask questions and all were answered. The patient agreed with the plan and demonstrated an understanding of the instructions.   The patient was advised to call back or seek an in-person evaluation if the symptoms worsen or if the  condition fails to improve as anticipated.  I spent 32 minutes dedicated to the care of this patient, including pre-visit review of records, face to face time, post-visit ordering of testing and documentation.    Katherine DELENA Fetters, MD

## 2023-09-15 ENCOUNTER — Ambulatory Visit: Attending: Cardiology | Admitting: Cardiology

## 2023-09-15 ENCOUNTER — Ambulatory Visit: Attending: Cardiology

## 2023-09-15 ENCOUNTER — Encounter: Payer: Self-pay | Admitting: Cardiology

## 2023-09-15 ENCOUNTER — Encounter: Payer: Self-pay | Admitting: *Deleted

## 2023-09-15 VITALS — BP 142/78 | HR 66 | Ht 65.0 in | Wt 277.8 lb

## 2023-09-15 DIAGNOSIS — I340 Nonrheumatic mitral (valve) insufficiency: Secondary | ICD-10-CM

## 2023-09-15 DIAGNOSIS — R002 Palpitations: Secondary | ICD-10-CM

## 2023-09-15 DIAGNOSIS — G4730 Sleep apnea, unspecified: Secondary | ICD-10-CM | POA: Diagnosis not present

## 2023-09-15 DIAGNOSIS — I10 Essential (primary) hypertension: Secondary | ICD-10-CM | POA: Diagnosis not present

## 2023-09-15 DIAGNOSIS — I4891 Unspecified atrial fibrillation: Secondary | ICD-10-CM

## 2023-09-15 DIAGNOSIS — R0609 Other forms of dyspnea: Secondary | ICD-10-CM

## 2023-09-15 MED ORDER — DILTIAZEM HCL ER COATED BEADS 180 MG PO CP24: 180.0000 mg | ORAL_CAPSULE | Freq: Every day | ORAL | 3 refills | Status: AC

## 2023-09-15 NOTE — Progress Notes (Unsigned)
 [employer] / [employer] long term holter monitor mailed to patient.  Dr. Santo to read.

## 2023-09-15 NOTE — Progress Notes (Signed)
 Patient enrolled for [employer]/ [employer] to ship a 14 day long term holter monitor to her address on file.

## 2023-09-15 NOTE — Patient Instructions (Signed)
 Medication Instructions:  No changes *If you need a refill on your cardiac medications before your next appointment, please call your pharmacy*  Lab Work: We are going to draw a Bmet and CBC If you have labs (blood work) drawn today and your tests are completely normal, you will receive your results only by: MyChart Message (if you have MyChart) OR A paper copy in the mail If you have any lab test that is abnormal or we need to change your treatment, we will call you to review the results.  Testing/Procedures: Your physician has requested that you have an echocardiogram. Echocardiography is a painless test that uses sound waves to create images of your heart. It provides your doctor with information about the size and shape of your heart and how well your heart's chambers and valves are working. This procedure takes approximately one hour. There are no restrictions for this procedure. Please do NOT wear cologne, perfume, aftershave, or lotions (deodorant is allowed). Please arrive 15 minutes prior to your appointment time.  Please note: We ask at that you not bring children with you during ultrasound (echo/ vascular) testing. Due to room size and safety concerns, children are not allowed in the ultrasound rooms during exams. Our front office staff cannot provide observation of children in our lobby area while testing is being conducted. An adult accompanying a patient to their appointment will only be allowed in the ultrasound room at the discretion of the ultrasound technician under special circumstances. We apologize for any inconvenience.  Follow-Up: At [facility], you and your health needs are our priority.  As part of our continuing mission to provide you with exceptional heart care, our providers are all part of one team.  This team includes your primary Cardiologist (physician) and Advanced Practice Providers or APPs (Physician Assistants and Nurse Practitioners) who all work  together to provide you with the care you need, when you need it.  Your next appointment:   3 month(s)  Provider:   Stanly DELENA Leavens, MD  Other Instructions A [employer]ice monitor is coming in the mail. DO NOT DESTROY BOX

## 2023-09-15 NOTE — Progress Notes (Signed)
 Cardiology Office Note    Date:  09/15/2023  ID:  Katherine Lewis, DOB 10/27/1970, MRN [id number] PCP:  Joyce Norleen BROCKS, MD  Cardiologist:  Stanly DELENA Leavens, MD  Electrophysiologist:  None   Chief Complaint: Palpitations   History of Present Illness: .    Katherine Lewis is a 54 y.o. female with visit-pertinent history of paroxysmal atrial fibrillation, hypertension, OSA.    Patient initially seen by Dr. Wendel in 10/2020 at the referral of her PCP for recommendations regarding atrial fibrillation picked up on mobile phone monitor.  Coronary CTA on 11/03/2020 indicated a coronary calcium score of 0.  Cardiac monitor in 2022 that was worn for 9 days indicated an average heart rate of 87 bpm, ranging from 57 to 224 bpm.  Predominant underlying rhythm was sinus rhythm, atrial fibrillation occurred less than 1% burden ranging from 105 to 224 bpm, average 150 bpm, longest lasting 1 hour and 49 minutes with an average of 152 bpm.  Atrial fibrillation was detected within +/- 45 seconds of symptomatic patient's event.  She was started on Eliquis  5 mg twice daily and Cardizem  120 mg daily.  Echocardiogram on 12/08/2020 indicated LVEF of 60 to 65%, no RWMA, mild LVH, RV systolic function and size was normal, LA was mildly dilated, mitral valve was normal in structure with no evidence of regurgitation or stenosis, aortic valve regurgitation was not visualized and no stenosis present.   Patient presented of 01/2022 with reports of worsening shortness of breath and palpitations.  Herdiltiazem was increased to 180 mg daily.  Echocardiogram on 03/03/2022 indicated LVEF of 60 to 65%, no RWMA, diastolic parameters were normal, RV systolic function and size was normal, LA and RA were mildly dilated, there was mild mitral valve regurgitation with no evidence of stenosis, aortic valve sclerosis was present without any evidence of stenosis, there is borderline dilation of the ascending aorta measuring 37 mm.    Patient was seen by Dr. Leavens on 05/17/22, patient reported intermittent chest pain that was not purely exertional.  She reported that since her coronary CTA she did not have any improvement or worsening.  She noted problems with her CPAP device, as well as worsening shortness of breath.  It was discussed starting on SGLT2, however this was discontinued.  Echo stress test was recommended, this was without significant MR or diastolic dysfunction, patient had a UTI and no improvement in symptoms on SGLT2 inhibitor.  It was felt that likely morbid obesity and deconditioning were likely contributors to her dyspnea on exertion.  It was noted she did not have evidence heart failure preserved ejection fraction or exercise-induced MR.  It was recommended that she follow-up with Dr. Wendel in one year.   Patient was last seen in clinic on 06/16/2023 regarding palpitations, chest discomfort and increased shortness of breath.  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  Patient reported that she had shortness of breath with exertion that resulted in significant coughing, denied any wheezing.  She denied any chest pain with exertion.  She reported intermittent chest comfort that was above the left breast with no radiation and no association with exertion, would last for hours.  She reported that this have been ongoing for the last year even prior to her last stress test.  Patient reported daily palpitations that felt like her prior atrial fibrillation that typically lasted under an hour, associated with a fluttery sensation  in her chest.  Patient reported that her shortness of breath started following a severe case of COVID in 2021 and also reported that her weight was also likely contributing to shortness of breath however reported that she been overweight for majority of her life and did not have problems until recent  years.  2-week cardiac monitor to reassess A-fib burden was ordered, not completed as she notes that the monitor fell off within 24 hours.   Today she presents for follow-up.  She reports that she continues to have palpitations daily, she feels that she is having episodes of atrial fibrillation when she exerts herself as well as when she is laying down flat trying to sleep.  She notes that she will wake up in the middle the night and her palpitations were persist for prolonged periods of time.  Patient reports that her prior cardiac monitor was not completed as she notes that it fell off within 24 hours of application.  She continues to note increased dyspnea on exertion, feels somewhat worse with increased palpitations, also notes intermittent chest pain that is unchanged.  She also reports increased episodes of dizziness, unsure if associated with palpitations. ROS: .   Today she denies lower extremity edema, fatigue, palpitations, melena, hematuria, hemoptysis, diaphoresis, weakness, presyncope, syncope, orthopnea, and PND.  All other systems are reviewed and otherwise negative. Studies Reviewed: SABRA   EKG:  EKG is not ordered today.  CV Studies: Cardiac studies reviewed are outlined and summarized above. Otherwise please see EMR for full report. Cardiac Studies & Procedures   ______________________________________________________________________________________________   STRESS TESTS  ECHOCARDIOGRAM STRESS TEST 07/15/2022  Narrative EXERCISE STRESS ECHO REPORT   --------------------------------------------------------------------------------  Patient Name:   Katherine Lewis  Date of Exam: 07/15/2022 Medical Rec #:  [id number]     Height:       65.0 in Accession #:    [id number]    Weight:       271.2 lb Date of Birth:  03-16-1970    BSA:          2.251 m Patient Age:    51 years      BP:           168/101 mmHg Patient Gender: F             HR:           82 bpm. Exam Location:  [address]  Procedure: Stress Echo, 2D Echo, Limited Color Doppler and Cardiac Doppler  Indications:    Evaluation of LV function, mitral regurgitation and diastolic function I50.30 Dyspnea on exertion R06.09  History:        Patient has prior history of Echocardiogram examinations. Previous echo revealed LVEF 65% with mild MR.  Sonographer:    Nolon Berg BA, RDCS Referring Phys: NORLEEN JAYSON JOBS   Conclusion(s)/Recommendation(s): Indication for stress test to assess increase in PASP with activity in the setting of MR. TR jet was not sufficient in exercise to assess change in PASP. No impairment in diastology with exercise.  FINDINGS  Exam Protocol: The patient exercised on a treadmill according to a Bruce protocol. Imaging not done in standard stress echocardiogram preset.   Patient Performance: The patient exercised for 4 minutes and 0 seconds, achieving 5.7 METS. The maximum stage achieved was I of the Bruce protocol. The baseline heart rate was 82 bpm. The heart rate at peak stress was 142 bpm. The target heart rate was calculated to be 143 bpm. The percentage of  maximum predicted heart rate achieved was 84.3 %. The baseline blood pressure was 141/82 mmHg. The blood pressure at peak stress was 185/91 mmHg. The patient developed shortness of breath and fatigue during the stress exam. The symptoms resolved with rest.  EKG: The patient developed no abnormal EKG findings during exercise.   Ronal Ross Electronically signed on 07/16/2022 at 7:58:33 AM      Final   ECHOCARDIOGRAM  ECHOCARDIOGRAM COMPLETE 03/03/2022  Narrative ECHOCARDIOGRAM REPORT    Patient Name:   Katherine Lewis  Date of Exam: 03/03/2022 Medical Rec #:  [id number]     Height:       65.0 in Accession #:    [id number]    Weight:       267.6 lb Date of Birth:  17-Aug-1970    BSA:          2.239 m Patient Age:    51 years      BP:           138/86 mmHg Patient Gender: F             HR:           60 bpm. Exam  Location:  [address]  Procedure: 2D Echo, Cardiac Doppler, Color Doppler and Strain Analysis  Indications:    R06.00 DOE  History:        Patient has prior history of Echocardiogram examinations, most recent 12/08/2020. Arrythmias:Atrial Fibrillation; Risk Factors:Hypertension and Sleep Apnea.  Sonographer:    Waldo Guadalajara RCS Referring Phys: 28 TESSA N CONTE  IMPRESSIONS   1. Left ventricular ejection fraction, by estimation, is 60 to 65%. The left ventricle has normal function. The left ventricle has no regional wall motion abnormalities. Left ventricular diastolic parameters were normal. 2. Right ventricular systolic function is normal. The right ventricular size is normal. There is normal pulmonary artery systolic pressure. 3. Left atrial size was mildly dilated. 4. Right atrial size was mildly dilated. 5. A small pericardial effusion is present. The pericardial effusion is posterior to the left ventricle. 6. The mitral valve is normal in structure. Mild mitral valve regurgitation. No evidence of mitral stenosis. 7. The aortic valve is tricuspid. There is mild calcification of the aortic valve. Aortic valve regurgitation is not visualized. Aortic valve sclerosis/calcification is present, without any evidence of aortic stenosis. 8. Aortic dilatation noted. There is borderline dilatation of the ascending aorta, measuring 37 mm. 9. The inferior vena cava is normal in size with greater than 50% respiratory variability, suggesting right atrial pressure of 3 mmHg.  FINDINGS Left Ventricle: Left ventricular ejection fraction, by estimation, is 60 to 65%. The left ventricle has normal function. The left ventricle has no regional wall motion abnormalities. The left ventricular internal cavity size was normal in size. There is no left ventricular hypertrophy. Left ventricular diastolic parameters were normal.  Right Ventricle: The right ventricular size is normal. No increase in right  ventricular wall thickness. Right ventricular systolic function is normal. There is normal pulmonary artery systolic pressure. The tricuspid regurgitant velocity is 2.03 m/s, and with an assumed right atrial pressure of 3 mmHg, the estimated right ventricular systolic pressure is 19.5 mmHg.  Left Atrium: Left atrial size was mildly dilated.  Right Atrium: Right atrial size was mildly dilated.  Pericardium: A small pericardial effusion is present. The pericardial effusion is posterior to the left ventricle.  Mitral Valve: The mitral valve is normal in structure. Mild mitral valve regurgitation. No evidence of mitral valve stenosis.  Tricuspid Valve: The tricuspid valve is normal in structure. Tricuspid valve regurgitation is mild . No evidence of tricuspid stenosis.  Aortic Valve: The aortic valve is tricuspid. There is mild calcification of the aortic valve. Aortic valve regurgitation is not visualized. Aortic valve sclerosis/calcification is present, without any evidence of aortic stenosis.  Pulmonic Valve: The pulmonic valve was normal in structure. Pulmonic valve regurgitation is not visualized. No evidence of pulmonic stenosis.  Aorta: Aortic dilatation noted. There is borderline dilatation of the ascending aorta, measuring 37 mm.  Venous: The inferior vena cava is normal in size with greater than 50% respiratory variability, suggesting right atrial pressure of 3 mmHg.  IAS/Shunts: No atrial level shunt detected by color flow Doppler.   LEFT VENTRICLE PLAX 2D LVIDd:         5.30 cm   Diastology LVIDs:         3.60 cm   LV e' medial:    7.62 cm/s LV PW:         1.30 cm   LV E/e' medial:  13.9 LV IVS:        1.00 cm   LV e' lateral:   13.90 cm/s LVOT diam:     1.90 cm   LV E/e' lateral: 7.6 LV SV:         82 LV SV Index:   37        2D Longitudinal Strain LVOT Area:     2.84 cm  2D Strain GLS (A2C):   -21.8 % 2D Strain GLS (A3C):   -27.0 % 2D Strain GLS (A4C):   -19.2 % 2D  Strain GLS Avg:     -22.7 %  RIGHT VENTRICLE RV Basal diam:  3.10 cm RV S prime:     10.90 cm/s TAPSE (M-mode): 1.9 cm RVSP:           19.5 mmHg  LEFT ATRIUM             Index        RIGHT ATRIUM           Index LA diam:        4.70 cm 2.10 cm/m   RA Pressure: 3.00 mmHg LA Vol (A2C):   62.1 ml 27.74 ml/m  RA Area:     14.90 cm LA Vol (A4C):   59.7 ml 26.67 ml/m  RA Volume:   38.40 ml  17.15 ml/m LA Biplane Vol: 60.2 ml 26.89 ml/m AORTIC VALVE LVOT Vmax:   129.00 cm/s LVOT Vmean:  80.700 cm/s LVOT VTI:    0.289 m  AORTA Ao Root diam: 3.00 cm Ao Asc diam:  3.70 cm  MITRAL VALVE                TRICUSPID VALVE MV Area (PHT):              TR Peak grad:   16.5 mmHg MV Decel Time:              TR Vmax:        203.00 cm/s MV E velocity: 106.00 cm/s  Estimated RAP:  3.00 mmHg MV A velocity: 88.00 cm/s   RVSP:           19.5 mmHg MV E/A ratio:  1.20 SHUNTS Systemic VTI:  0.29 m Systemic Diam: 1.90 cm  Toribio Fuel MD Electronically signed by Toribio Fuel MD Signature Date/Time: 03/03/2022/12:41:02 PM    Final    MONITORS  LONG TERM MONITOR (3-14 DAYS)  11/26/2020  Narrative Patch Wear Time:  9 days and 0 hours (2022-10-19T06:47:10-398 to 2022-10-28T07:01:44-0400)  Patient had a min HR of 57 bpm, max HR of 224 bpm, and avg HR of 87 bpm. Predominant underlying rhythm was Sinus Rhythm. Atrial Fibrillation occurred (<1% burden), ranging from 105-224 bpm (avg of 152 bpm), the longest lasting 1 hour 49 mins with an avg rate of 152 bpm. Atrial Fibrillation was detected within +/- 45 seconds of symptomatic patient event(s). Isolated SVEs were rare (<1.0%), SVE Couplets were rare (<1.0%), and SVE Triplets were rare (<1.0%). Isolated VEs were rare (<1.0%), and no VE Couplets or VE Triplets were present.   CT SCANS  CT CORONARY MORPH W/CTA COR W/SCORE 11/03/2020  Addendum 11/03/2020  9:50 PM ADDENDUM REPORT: 11/03/2020 21:48  CLINICAL DATA:  Chest  pain  EXAM: Cardiac/Coronary CTA  TECHNIQUE: A non-contrast, gated CT scan was obtained with axial slices of 3 mm through the heart for calcium scoring. Calcium scoring was performed using the Agatston method. A 120 kV prospective, gated, contrast cardiac scan was obtained. Gantry rotation speed was 250 msecs and collimation was 0.6 mm. Two sublingual nitroglycerin  tablets (0.8 mg) were given. The 3D data set was reconstructed in 5% intervals of the 35-75% of the R-R cycle. Diastolic phases were analyzed on a dedicated workstation using MPR, MIP, and VRT modes. The patient received 95 cc of contrast.  FINDINGS: Image quality: Excellent.  Noise artifact is: Limited.  Coronary Arteries:  Normal coronary origin.  Right dominance.  Left main: The left main is a large caliber vessel with a normal take off from the left coronary cusp that bifurcates to form a left anterior descending artery and a left circumflex artery. There is no plaque or stenosis.  Left anterior descending artery: The LAD is patent without evidence of plaque or stenosis. The LAD gives off 2 patent diagonal branches.  Left circumflex artery: The LCX is non-dominant and patent with no evidence of plaque or stenosis. The LCX gives off 2 patent obtuse marginal branches.  Right coronary artery: The RCA is dominant with normal take off from the right coronary cusp. There is no evidence of plaque or stenosis. The RCA terminates as a PDA and right posterolateral branch without evidence of plaque or stenosis.  Right Atrium: Right atrial size is within normal limits.  Right Ventricle: The right ventricular cavity is within normal limits.  Left Atrium: Left atrial size is normal in size with no left atrial appendage filling defect.  Left Ventricle: The ventricular cavity size is within normal limits. There are no stigmata of prior infarction. There is no abnormal filling defect.  Pulmonary arteries: Normal in  size without proximal filling defect.  Pulmonary veins: Normal pulmonary venous drainage.  Pericardium: Normal thickness with no significant effusion or calcium present.  Cardiac valves: The aortic valve is trileaflet without significant calcification. The mitral valve is normal structure without significant calcification.  Aorta: Normal caliber with no significant disease.  Extra-cardiac findings: See attached radiology report for non-cardiac structures.  IMPRESSION: 1. Coronary calcium score of 0. This was 0 percentile for age-, sex, and race-matched controls.  2. Normal coronary origin with right dominance.  3. Normal coronary arteries.  CAD RADS 0.  RECOMMENDATIONS: 1. CAD-RADS 0: No evidence of CAD (0%). Consider non-atherosclerotic causes of chest pain.  2. CAD-RADS 1: Minimal non-obstructive CAD (0-24%). Consider non-atherosclerotic causes of chest pain. Consider preventive therapy and risk factor modification.  3. CAD-RADS 2: Mild non-obstructive CAD (25-49%). Consider non-atherosclerotic  causes of chest pain. Consider preventive therapy and risk factor modification.  4. CAD-RADS 3: Moderate stenosis. Consider symptom-guided anti-ischemic pharmacotherapy as well as risk factor modification per guideline directed care. Additional analysis with CT FFR will be submitted.  5. CAD-RADS 4: Severe stenosis. (70-99% or > 50% left main). Cardiac catheterization or CT FFR is recommended. Consider symptom-guided anti-ischemic pharmacotherapy as well as risk factor modification per guideline directed care. Invasive coronary angiography recommended with revascularization per published guideline statements.  6. CAD-RADS 5: Total coronary occlusion (100%). Consider cardiac catheterization or viability assessment. Consider symptom-guided anti-ischemic pharmacotherapy as well as risk factor modification per guideline directed care.  7. CAD-RADS N: Non-diagnostic study.  Obstructive CAD can't be excluded. Alternative evaluation is recommended.  Wilbert Bihari, MD   Electronically Signed By: Wilbert Bihari M.D. On: 11/03/2020 21:48  Narrative EXAM: OVER-READ INTERPRETATION  CT CHEST  The following report is an over-read performed by radiologist Dr. Juliene Balder of [facility] on 11/03/2020. This over-read does not include interpretation of cardiac or coronary anatomy or pathology. The coronary calcium score/coronary CTA interpretation by the cardiologist is attached.  COMPARISON:  None.  FINDINGS: Vascular: Normal caliber of the visualized thoracic aorta.  Mediastinum/Nodes: Visualized mediastinal structures are normal.  Lungs/Pleura: 4 mm nodule at the left lung base on sequence 11, image 49. No pleural effusions. No large areas of airspace disease or lung consolidation.  Upper Abdomen: Left hepatic lobe may be prominent but this is incompletely evaluated.  Musculoskeletal: No acute bone abnormality.  IMPRESSION: 1. No acute extracardiac findings. 2. Indeterminate 4 mm nodule at the left lung base. No follow-up needed if patient is low-risk. Non-contrast chest CT can be considered in 12 months if patient is high-risk. This recommendation follows the consensus statement: Guidelines for Management of Incidental Pulmonary Nodules Detected on CT Images: From the Fleischner Society 2017; Radiology 2017; 284:228-243.  Electronically Signed: By: Juliene Balder M.D. On: 11/03/2020 16:58     ______________________________________________________________________________________________       Current Reported Medications:.    Current Meds  Medication Sig   ELIQUIS  5 MG TABS tablet TAKE ONE TABLET BY MOUTH TWICE DAILY   escitalopram  (LEXAPRO ) 5 MG tablet Take 1 tablet (5 mg total) by mouth daily.   levonorgestrel (MIRENA) 20 MCG/24HR IUD 1 each by Intrauterine route once. Reported on 06/23/2015   pantoprazole  (PROTONIX ) 40 MG tablet  TAKE ONE TABLET BY MOUTH ONCE DAILY   [DISCONTINUED] CARTIA  XT 180 MG 24 hr capsule Take 1 capsule (180 mg total) by mouth daily.    Physical Exam:    VS:  BP (!) 142/78   Pulse 66   Ht 5' 5 (1.651 m)   Wt 277 lb 12.8 oz (126 kg)   SpO2 95%   BMI 46.23 kg/m    Wt Readings from Last 3 Encounters:  09/15/23 277 lb 12.8 oz (126 kg)  09/01/23 280 lb (127 kg)  06/16/23 282 lb (127.9 kg)    GEN: Well nourished, well developed in no acute distress NECK: No JVD; No carotid bruits CARDIAC: RRR, no murmurs, rubs, gallops RESPIRATORY:  Clear to auscultation without rales, wheezing or rhonchi  ABDOMEN: Soft, non-tender, non-distended EXTREMITIES:  No edema; No acute deformity     Asessement and Plan:.    Atrial fibrillation: Patient with history of PAF, on diltiazem  and Eliquis .  Patient reports daily palpitations that feel like her prior atrial fibrillation, episodes typically last under an hour associated with a weird or fluttery sensation in  her chest.  She reports adherence with diltiazem  and Eliquis , denies any significant bleeding.  Question if her A-fib burden has increased resulting in worsening shortness of breath.  Reports that she is having atrial fibrillation daily, also reports intermittent episodes of dizziness, she is unsure if related to palpitations.  She was unable to complete prior cardiac monitor as she notes it fell off within the first 24 hours, reviewed with monitor clinic, recommended long-term [employer] monitor.  Patient in agreement.  Reviewed ED precautions.  Check CBC and BMET.    Hypertension: Initial blood pressure today 156/82, on recheck was 142/78.  Patient notes that she is currently recovering from a COVID, notes that her blood pressure is typically not this high.  She will continue to monitor.  Continue Cartia  XT 180 mg daily.   Chest discomfort: Patient reports an intermittent chest discomfort that is above the left breast with no radiation and no association  with exertion, last a few hours.  She reports that this has been ongoing for the last year even prior to her last stress test.  Echo stress last year was within normal limits.  Patient's coronary CTA in 10/2020 indicated a coronary calcium score of 0.  Low suspicion that this is cardiac in nature, consider PET stress if cardiac monitor is unrevealing.   OSA: Patient reports nightly CPAP compliance, she is followed by pulmonology.   Shortness of breath: Patient reports ongoing shortness of breath that has been overall unchanged in the last few years.  Patient had an echo stress test last year for similar ongoing symptoms that was within normal limits.  She appears leaving the, compensated on exam.  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  Patient reports that her symptoms seem to have worsened with increased palpitations, pleural check echocardiogram.   Mild mitral valve regurgitation: Noted on echo in 02/2022.  Stress echo did not show evidence of heart failure preserved ejection fraction or exercise-induced MR, was within normal limits.  Patient notes worsening palpitations and shortness of breath, will recheck echocardiogram.   Disposition: F/u with Dr. Santo in 3 months.   Signed, Kayann Maj D Blannie Shedlock, NP

## 2023-09-16 ENCOUNTER — Ambulatory Visit: Payer: Self-pay | Admitting: Cardiology

## 2023-09-16 LAB — CBC
Hematocrit: 40.2 % (ref 34.0–46.6)
Hemoglobin: 13.2 g/dL (ref 11.1–15.9)
MCH: 29.9 pg (ref 26.6–33.0)
MCHC: 32.8 g/dL (ref 31.5–35.7)
MCV: 91 fL (ref 79–97)
Platelets: 265 x10E3/uL (ref 150–450)
RBC: 4.41 x10E6/uL (ref 3.77–5.28)
RDW: 14.3 % (ref 11.7–15.4)
WBC: 8.6 x10E3/uL (ref 3.4–10.8)

## 2023-09-16 LAB — BASIC METABOLIC PANEL WITH GFR
BUN/Creatinine Ratio: 13 (ref 9–23)
BUN: 10 mg/dL (ref 6–24)
CO2: 22 mmol/L (ref 20–29)
Calcium: 9.4 mg/dL (ref 8.7–10.2)
Chloride: 103 mmol/L (ref 96–106)
Creatinine, Ser: 0.75 mg/dL (ref 0.57–1.00)
Glucose: 88 mg/dL (ref 70–99)
Potassium: 4 mmol/L (ref 3.5–5.2)
Sodium: 144 mmol/L (ref 134–144)
eGFR: 96 mL/min/1.73 (ref >59)

## 2023-10-19 DIAGNOSIS — R002 Palpitations: Secondary | ICD-10-CM

## 2023-10-19 DIAGNOSIS — I4891 Unspecified atrial fibrillation: Secondary | ICD-10-CM

## 2023-10-20 ENCOUNTER — Other Ambulatory Visit: Payer: Self-pay

## 2023-10-20 DIAGNOSIS — I480 Paroxysmal atrial fibrillation: Secondary | ICD-10-CM

## 2023-10-20 DIAGNOSIS — I4891 Unspecified atrial fibrillation: Secondary | ICD-10-CM

## 2023-11-01 ENCOUNTER — Ambulatory Visit (HOSPITAL_COMMUNITY)
Admission: RE | Admit: 2023-11-01 | Discharge: 2023-11-01 | Disposition: A | Discharge status: Home / Self Care | Source: Ambulatory Visit | Attending: Internal Medicine | Admitting: Internal Medicine

## 2023-11-01 DIAGNOSIS — R002 Palpitations: Secondary | ICD-10-CM | POA: Insufficient documentation

## 2023-11-01 DIAGNOSIS — I340 Nonrheumatic mitral (valve) insufficiency: Secondary | ICD-10-CM | POA: Insufficient documentation

## 2023-11-01 DIAGNOSIS — R0609 Other forms of dyspnea: Secondary | ICD-10-CM | POA: Diagnosis present

## 2023-11-01 DIAGNOSIS — I4891 Unspecified atrial fibrillation: Secondary | ICD-10-CM | POA: Insufficient documentation

## 2023-11-01 LAB — ECHOCARDIOGRAM COMPLETE
Area-P 1/2: 3.79 cm2
S' Lateral: 3.4 cm

## 2023-11-03 ENCOUNTER — Ambulatory Visit (HOSPITAL_COMMUNITY)
Admission: RE | Admit: 2023-11-03 | Discharge: 2023-11-03 | Disposition: A | Discharge status: Home / Self Care | Source: Ambulatory Visit | Attending: Internal Medicine | Admitting: Internal Medicine

## 2023-11-03 ENCOUNTER — Encounter (HOSPITAL_COMMUNITY): Payer: Self-pay | Admitting: Internal Medicine

## 2023-11-03 VITALS — BP 124/80 | HR 68 | Ht 65.0 in | Wt 285.4 lb

## 2023-11-03 DIAGNOSIS — I480 Paroxysmal atrial fibrillation: Secondary | ICD-10-CM | POA: Diagnosis not present

## 2023-11-03 MED ORDER — DILTIAZEM HCL 30 MG PO TABS: 30.0000 mg | ORAL_TABLET | Freq: Three times a day (TID) | ORAL | 0 refills | Status: AC | PRN

## 2023-11-03 NOTE — Progress Notes (Signed)
 Primary Care Physician: Joyce Norleen BROCKS, MD Primary Cardiologist: Stanly DELENA Leavens, MD Electrophysiologist: None     Referring Physician: West, Katlyn D, NP     Katherine Lewis is a 53 y.o. female with a history of HTN, HLD, OSA, and atrial fibrillation who presents for consultation in the [facility]. Wore cardiac monitor 08/2023 which showed 1% Afib burden, less than 1% ectopy, and triggered/diary events associated with sinus rhythm and Afib. Patient is on Eliquis  for stroke prevention.  On evaluation today, patient is currently in NSR. She notes to be SOB with minimal exertion and occurs daily.   Today, she denies symptoms of palpitations, chest pain,  orthopnea, PND, lower extremity edema, dizziness, presyncope, syncope, bleeding, or neurologic sequela. The patient is tolerating medications without difficulties and is otherwise without complaint today.    Atrial Fibrillation Risk Factors:  she does have symptoms or diagnosis of sleep apnea. she is compliant with CPAP therapy.   she has a BMI of Body mass index is 47.49 kg/m.SABRA Filed Weights   11/03/23 0827  Weight: 129.5 kg    Current Outpatient Medications  Medication Sig Dispense Refill   diltiazem  (CARDIZEM ) 30 MG tablet Take 1 tablet (30 mg total) by mouth 3 (three) times daily as needed. For elevated HR above 100 30 tablet 0   diltiazem  (CARTIA  XT) 180 MG 24 hr capsule Take 1 capsule (180 mg total) by mouth daily. 90 capsule 3   ELIQUIS  5 MG TABS tablet TAKE ONE TABLET BY MOUTH TWICE DAILY 180 tablet 1   escitalopram  (LEXAPRO ) 5 MG tablet Take 1 tablet (5 mg total) by mouth daily. 90 tablet 3   levonorgestrel (MIRENA) 20 MCG/24HR IUD 1 each by Intrauterine route once. Reported on 06/23/2015     pantoprazole  (PROTONIX ) 40 MG tablet TAKE ONE TABLET BY MOUTH ONCE DAILY 90 tablet 1   No current facility-administered medications for this encounter.    Atrial Fibrillation Management  history:  Previous antiarrhythmic drugs: none Previous cardioversions: none Previous ablations: none Anticoagulation history: Eliquis    ROS- All systems are reviewed and negative except as per the HPI above.  Physical Exam: BP 124/80   Pulse 68   Ht 5' 5 (1.651 m)   Wt 129.5 kg   BMI 47.49 kg/m   GEN: Well nourished, well developed in no acute distress NECK: No JVD; No carotid bruits CARDIAC: Regular rate and rhythm, no murmurs, rubs, gallops RESPIRATORY:  Clear to auscultation without rales, wheezing or rhonchi  ABDOMEN: Soft, non-tender, non-distended EXTREMITIES:  No edema; No deformity   EKG today demonstrates  Vent. rate 68 BPM PR interval 176 ms QRS duration 86 ms QT/QTcB 402/427 ms P-R-T axes 30 43 39 Normal sinus rhythm Low voltage QRS Borderline ECG When compared with ECG of 16-Jun-2023 08:45, No significant change was found  Echo 11/01/23 demonstrated  1. Left ventricular ejection fraction, by estimation, is 60 to 65%. Left  ventricular ejection fraction by 3D volume is 59 %. The left ventricle has  normal function. The left ventricle has no regional wall motion  abnormalities. The left ventricular  internal cavity size was mildly dilated. Left ventricular diastolic  parameters were normal.   2. Right ventricular systolic function is normal. The right ventricular  size is normal. There is normal pulmonary artery systolic pressure. The  estimated right ventricular systolic pressure is 20.5 mmHg.   3. The mitral valve is degenerative. Mild mitral valve regurgitation. No  evidence of mitral stenosis.  4. The aortic valve is normal in structure. Aortic valve regurgitation is  not visualized. No aortic stenosis is present.   5. Aortic dilatation noted. There is mild dilatation of the ascending  aorta, measuring 39 mm.   6. The inferior vena cava is normal in size with greater than 50%  respiratory variability, suggesting right atrial pressure of 3 mmHg.    ASSESSMENT & PLAN CHA2DS2-VASc Score = 2  The patient's score is based upon: CHF History: 0 HTN History: 1 Diabetes History: 0 Stroke History: 0 Vascular Disease History: 0 Age Score: 0 Gender Score: 1       ASSESSMENT AND PLAN: Paroxysmal Atrial Fibrillation (ICD10:  I48.0) The patient's CHA2DS2-VASc score is 2, indicating a 2.2% annual risk of stroke.    Patient is currently in NSR. We discussed her cardiac monitor results and rate control vs rhythm control strategy. Given patient has low overall burden and triggered events with SR, I do not believe rhythm control is recommended at this time. Patient notes daily SOB with minimal exertion, and is in SR when occurs. Discussed with patient should follow up with PCP for further assessment as echo/monitor results show overall unlikely cardiac etiology. We did discuss weight as a possible contributory factor. Will continue diltiazem  180 mg daily and prescribe diltiazem  30 mg PRN palpitations.      Follow up Afib clinic prn.   Terra Pac, [facility]  Afib Clinic [address] [place], [place] [address] [phone]

## 2023-11-03 NOTE — Patient Instructions (Addendum)
 May take diltiazem  30 mg for break through Afib with HR above 100 every 4-6 hours as needed

## 2023-11-23 ENCOUNTER — Encounter: Payer: Self-pay | Admitting: Internal Medicine

## 2023-11-23 ENCOUNTER — Other Ambulatory Visit: Payer: Self-pay | Admitting: *Deleted

## 2023-11-23 DIAGNOSIS — G4733 Obstructive sleep apnea (adult) (pediatric): Secondary | ICD-10-CM

## 2023-12-13 ENCOUNTER — Ambulatory Visit: Attending: Internal Medicine | Admitting: Internal Medicine

## 2023-12-13 VITALS — BP 147/84 | HR 65 | Ht 65.0 in | Wt 286.0 lb

## 2023-12-13 DIAGNOSIS — Z5181 Encounter for therapeutic drug level monitoring: Secondary | ICD-10-CM | POA: Insufficient documentation

## 2023-12-13 DIAGNOSIS — I340 Nonrheumatic mitral (valve) insufficiency: Secondary | ICD-10-CM

## 2023-12-13 DIAGNOSIS — Z79899 Other long term (current) drug therapy: Secondary | ICD-10-CM | POA: Diagnosis not present

## 2023-12-13 DIAGNOSIS — I480 Paroxysmal atrial fibrillation: Secondary | ICD-10-CM

## 2023-12-13 MED ORDER — FLECAINIDE ACETATE 50 MG PO TABS: 50.0000 mg | ORAL_TABLET | Freq: Two times a day (BID) | ORAL | 3 refills | Status: AC

## 2023-12-13 NOTE — Patient Instructions (Signed)
 Medication Instructions:   START flecainide  50mg  twice daily (every 12 hours) for PAF  *If you need a refill on your cardiac medications before your next appointment, please call your pharmacy*  Testing/Procedures:  Exercise Tolerance Test   Follow-Up: At [facility], you and your health needs are our priority.  As part of our continuing mission to provide you with exceptional heart care, our providers are all part of one team.  This team includes your primary Cardiologist (physician) and Advanced Practice Providers or APPs (Physician Assistants and Nurse Practitioners) who all work together to provide you with the care you need, when you need it.  Your next appointment:    April 2026 with Katlyn West NP   Nurse Visit for EKG:   You have been referred to Cardiac Electrophysiology team to discuss ablation  We recommend signing up for the patient portal called MyChart.  Sign up information is provided on this After Visit Summary.  MyChart is used to connect with patients for Virtual Visits (Telemedicine).  Patients are able to view lab/test results, encounter notes, upcoming appointments, etc.  Non-urgent messages can be sent to your provider as well.   To learn more about what you can do with MyChart, go to [email or web address].   Other Instructions  Exercise Stress Test An exercise stress test is a test to check how your heart works during exercise. You will need to walk on a treadmill or ride an exercise bike for this test. An electrocardiogram (ECG) will record your heartbeat when you are at rest and when you are exercising. You may have an ultrasound or nuclear scan after the exercise test. The test is done to check for coronary artery disease (CAD). It is also done to: See how well you can exercise. Watch for high blood pressure during exercise. Test how well you can exercise after treatment. Check the blood flow to your arms and legs. If your test result is not  normal, more testing may be needed. Tell a doctor about: Any allergies you have. All medicines you are taking. This includes vitamins, herbs, eye drops, creams, and over-the-counter medicines. Any surgeries you have had. Tell your doctor if you have a pacemaker or a defibrillator called an ICD. Any bleeding problems you have. Any medical conditions you have. Whether you are pregnant or may be pregnant. What are the risks? Pain or pressure in the following areas: Chest. Jaw or neck. Between your shoulder blades. Down your left arm. Legs. Feeling dizzy or light-headed. Shortness of breath. Irregular heartbeat. Feeling like you may vomit (nausea) or vomiting. What happens before the test? Follow instructions from your doctor about what you cannot eat or drink. Do not have any drinks or foods that have caffeine in them for 24 hours before the test, or as told by your doctor. This includes coffee, tea (even decaf tea), sodas, chocolate, and cocoa. Ask your doctor about changing or stopping: Over-the-counter medicines. Vitamins, herbs, and supplements. Your normal medicines. This is important if: You take diabetes medicines. Ask how to take insulin or pills. You may be told to change your insulin dose the morning of the test. You take beta-blocker medicines. These medicines may cause problems in your test. You may be told to stop taking them 24 hours before the test. You wear a nitroglycerin  patch. The patch may need to be taken off before the test. Do not smoke or use any products that contain nicotine or tobacco for 4 hours before the test. If  you need help quitting, ask your doctor. If you use an inhaler, bring it with you to the test. Do not put lotions, powders, creams, or oils on your chest before the test. Wear comfortable shoes and loose-fitting clothing. What happens during the test?  Patches (electrodes) will be put on your chest. Wires will be connected to the patches. The  wires will send signals to a machine to record your heartbeat. Your heart rate will be watched while you are resting and while you are exercising. Your blood pressure and oxygen levels will also be watched during the test. You will walk on a treadmill or use an exercise bike. If you cannot use these, you may be asked to turn a crank with your hands. The activity will get harder and will raise your heart rate. You may be asked to breathe into a tube a few times during the test. This measures the gases that you breathe out. You will be asked how you are feeling throughout the test. You will exercise until your heart reaches a target heart rate. You will stop early if: You have chest pain. You feel dizzy. You are out of breath. You are too tired to keep going. Your blood pressure is too high or too low. You have an irregular heartbeat. You have pain or aching in your arms or legs. The test may vary among doctors and hospitals. What can I expect after the test? You will be monitored until you leave the hospital or clinic. This includes checking your blood pressure, heart rate, breathing rate, and blood oxygen level. You may return to your normal diet and activities as told by your doctor. It is up to you to get the results of your test. Ask how to get your results when they are ready. Summary An exercise stress test is a test to check how your heart works during exercise. This test is done to check for coronary artery disease. Your heart rate will be watched while you are resting and while you are exercising. Follow instructions from your doctor about what you cannot eat or drink before the test. This information is not intended to replace advice given to you by your health care provider. Make sure you discuss any questions you have with your health care provider. Document Revised: 11/18/2020 Document Reviewed: 11/18/2020 Elsevier Patient Education  2024 [employer].

## 2023-12-13 NOTE — Progress Notes (Signed)
 Cardiology Office Note:  .    Date:  12/13/2023  ID:  Katherine Lewis, DOB 07-30-1970, MRN [id number] PCP: Joyce Norleen BROCKS, MD  [facility] Providers Cardiologist:  Stanly DELENA Leavens, MD     CC: Worsening AF  History of Present Illness: .    Katherine Lewis is a 53 y.o. female with a PAF, OSA, HTN, and morbid obesity.  No evidence of exercise induced valve disease.   2024: Transitioned to my care. 2025: Heart monitor with symptomatic AF. Mild MR.  Aorta ULN (Z score 1.36) [facility]- Katlyn West NP and Me  Katherine Lewis is a 53 year old female with paroxysmal atrial fibrillation who presents with escalating symptomatic episodes.  She has experienced an increase in the frequency and severity of her paroxysmal atrial fibrillation episodes, now occurring at least twice a week. A recent episode in [place] lasted five hours and was accompanied by significant distress and shortness of breath. She manages these episodes by resting in a quiet environment until they resolve.  She has hypertension and has noted slightly elevated blood pressure on several occasions. Her current medications include diltiazem  and Eliquis . Over the past year, she has experienced episodes of dizziness, described as needing to hold onto something to prevent falling. There have been no changes in her daily routine, job, or exercise habits.  Her past medical history includes obstructive sleep apnea, for which she is compliant with CPAP therapy, achieving an apnea-hypopnea index of less than 1.5. She also has a history of morbid obesity and has previously received exercise and dietary counseling, though she finds it challenging to implement lifestyle changes due to her busy schedule.  She has been diagnosed with tracheomalacia, contributing to her persistent shortness of breath. An echocardiogram showed mild mitral regurgitation and possible aortic dilation. No evidence of exercise-induced valve disease or  pulmonary hypertension.   Discussed the use of AI scribe software for clinical note transcription with the patient, who gave verbal consent to proceed.   ROS: As per HPI.   Studies Reviewed: .     Cardiac Studies & Procedures   ______________________________________________________________________________________________   STRESS TESTS  ECHOCARDIOGRAM STRESS TEST 07/15/2022  Narrative EXERCISE STRESS ECHO REPORT   --------------------------------------------------------------------------------  Patient Name:   Katherine Lewis  Date of Exam: 07/15/2022 Medical Rec #:  [id number]     Height:       65.0 in Accession #:    [id number]    Weight:       271.2 lb Date of Birth:  [birth year]    BSA:          2.251 m Patient Age:    51 years      BP:           168/101 mmHg Patient Gender: F             HR:           82 bpm. Exam Location:  [address]  Procedure: Stress Echo, 2D Echo, Limited Color Doppler and Cardiac Doppler  Indications:    Evaluation of LV function, mitral regurgitation and diastolic function I50.30 Dyspnea on exertion R06.09  History:        Patient has prior history of Echocardiogram examinations. Previous echo revealed LVEF 65% with mild MR.  Sonographer:    Nolon Berg BA, RDCS Referring Phys: NORLEEN BROCKS JOYCE   Conclusion(s)/Recommendation(s): Indication for stress test to assess increase in PASP with activity in the setting of MR. TR jet was  not sufficient in exercise to assess change in PASP. No impairment in diastology with exercise.  FINDINGS  Exam Protocol: The patient exercised on a treadmill according to a Bruce protocol. Imaging not done in standard stress echocardiogram preset.   Patient Performance: The patient exercised for 4 minutes and 0 seconds, achieving 5.7 METS. The maximum stage achieved was I of the Bruce protocol. The baseline heart rate was 82 bpm. The heart rate at peak stress was 142 bpm. The target heart rate was calculated to  be 143 bpm. The percentage of maximum predicted heart rate achieved was 84.3 %. The baseline blood pressure was 141/82 mmHg. The blood pressure at peak stress was 185/91 mmHg. The patient developed shortness of breath and fatigue during the stress exam. The symptoms resolved with rest.  EKG: The patient developed no abnormal EKG findings during exercise.   Ronal Ross Electronically signed on 07/16/2022 at 7:58:33 AM      Final   ECHOCARDIOGRAM  ECHOCARDIOGRAM COMPLETE 11/01/2023  Narrative ECHOCARDIOGRAM REPORT    Patient Name:   Katherine Lewis  Date of Exam: 11/01/2023 Medical Rec #:  [id number]     Height:       65.0 in Accession #:    [id number]    Weight:       277.8 lb Date of Birth:  07-24-1970    BSA:          2.275 m Patient Age:    52 years      BP:           176/105 mmHg Patient Gender: F             HR:           71 bpm. Exam Location:  [address]  Procedure: 2D Echo, 3D Echo, Cardiac Doppler and Color Doppler (Both Spectral and Color Flow Doppler were utilized during procedure).  Indications:    I48.91 Atrial Fibrillation  History:        Patient has prior history of Echocardiogram examinations, most recent 03/03/2022. Signs/Symptoms:Shortness of Breath; Risk Factors:Hypertension.  Sonographer:    Waldo Guadalajara RCS Referring Phys: [id number] KATLYN D WEST  IMPRESSIONS   1. Left ventricular ejection fraction, by estimation, is 60 to 65%. Left ventricular ejection fraction by 3D volume is 59 %. The left ventricle has normal function. The left ventricle has no regional wall motion abnormalities. The left ventricular internal cavity size was mildly dilated. Left ventricular diastolic parameters were normal. 2. Right ventricular systolic function is normal. The right ventricular size is normal. There is normal pulmonary artery systolic pressure. The estimated right ventricular systolic pressure is 20.5 mmHg. 3. The mitral valve is degenerative. Mild mitral valve  regurgitation. No evidence of mitral stenosis. 4. The aortic valve is normal in structure. Aortic valve regurgitation is not visualized. No aortic stenosis is present. 5. Aortic dilatation noted. There is mild dilatation of the ascending aorta, measuring 39 mm. 6. The inferior vena cava is normal in size with greater than 50% respiratory variability, suggesting right atrial pressure of 3 mmHg.  FINDINGS Left Ventricle: Left ventricular ejection fraction, by estimation, is 60 to 65%. Left ventricular ejection fraction by 3D volume is 59 %. The left ventricle has normal function. The left ventricle has no regional wall motion abnormalities. The left ventricular internal cavity size was mildly dilated. There is no left ventricular hypertrophy. Left ventricular diastolic parameters were normal. Normal left ventricular filling pressure.  Right Ventricle: The right ventricular size is normal.  No increase in right ventricular wall thickness. Right ventricular systolic function is normal. There is normal pulmonary artery systolic pressure. The tricuspid regurgitant velocity is 2.09 m/s, and with an assumed right atrial pressure of 3 mmHg, the estimated right ventricular systolic pressure is 20.5 mmHg.  Left Atrium: Left atrial size was normal in size.  Right Atrium: Right atrial size was normal in size.  Pericardium: There is no evidence of pericardial effusion.  Mitral Valve: The mitral valve is degenerative in appearance. There is mild thickening of the mitral valve leaflet(s). There is mild calcification of the mitral valve leaflet(s). Mild mitral valve regurgitation. No evidence of mitral valve stenosis.  Tricuspid Valve: The tricuspid valve is normal in structure. Tricuspid valve regurgitation is trivial. No evidence of tricuspid stenosis.  Aortic Valve: The aortic valve is normal in structure. Aortic valve regurgitation is not visualized. No aortic stenosis is present.  Pulmonic Valve: The  pulmonic valve was normal in structure. Pulmonic valve regurgitation is not visualized. No evidence of pulmonic stenosis.  Aorta: Aortic dilatation noted. There is mild dilatation of the ascending aorta, measuring 39 mm.  Venous: The inferior vena cava is normal in size with greater than 50% respiratory variability, suggesting right atrial pressure of 3 mmHg.  IAS/Shunts: No atrial level shunt detected by color flow Doppler.  Additional Comments: 3D was performed not requiring image post processing on an independent workstation and was normal.   LEFT VENTRICLE PLAX 2D LVIDd:         5.40 cm         Diastology LVIDs:         3.40 cm         LV e' medial:    12.70 cm/s LV PW:         1.20 cm         LV E/e' medial:  9.0 LV IVS:        1.00 cm         LV e' lateral:   11.90 cm/s LVOT diam:     2.20 cm         LV E/e' lateral: 9.6 LV SV:         111 LV SV Index:   49 LVOT Area:     3.80 cm        3D Volume EF LV IVRT:       74 msec         LV 3D EF:    Left ventricul ar ejection fraction by 3D volume is 59 %.  3D Volume EF: 3D EF:        59 % LV EDV:       88 ml LV ESV:       36 ml LV SV:        53 ml  RIGHT VENTRICLE RV Basal diam:  3.50 cm     PULMONARY VEINS RV S prime:     11.30 cm/s  A Reversal Velocity: 27.30 cm/s TAPSE (M-mode): 2.7 cm      Diastolic Velocity:  47.50 cm/s RVSP:           20.5 mmHg   S/D Velocity:        1.10 Systolic Velocity:   54.50 cm/s  LEFT ATRIUM             Index        RIGHT ATRIUM           Index LA diam:  4.20 cm 1.85 cm/m   RA Pressure: 3.00 mmHg LA Vol (A2C):   49.5 ml 21.76 ml/m  RA Area:     16.90 cm LA Vol (A4C):   54.9 ml 24.14 ml/m  RA Volume:   44.50 ml  19.56 ml/m LA Biplane Vol: 52.5 ml 23.08 ml/m AORTIC VALVE LVOT Vmax:   111.00 cm/s LVOT Vmean:  82.500 cm/s LVOT VTI:    0.293 m  AORTA Ao Root diam: 3.10 cm Ao Asc diam:  3.90 cm  MITRAL VALVE                TRICUSPID VALVE MV Area (PHT):              TR  Peak grad:   17.5 mmHg MV Decel Time:              TR Vmax:        209.00 cm/s MV E velocity: 114.00 cm/s  Estimated RAP:  3.00 mmHg MV A velocity: 113.00 cm/s  RVSP:           20.5 mmHg MV E/A ratio:  1.01 SHUNTS Systemic VTI:  0.29 m Systemic Diam: 2.20 cm  Wilbert Bihari MD Electronically signed by Wilbert Bihari MD Signature Date/Time: 11/01/2023/10:20:18 AM    Final    MONITORS  LONG TERM MONITOR (3-14 DAYS) 10/18/2023  Narrative   Patient had a minimum heart rate of 49 bpm, maximum heart rate of 175 bpm (AF), and average heart rate of 73 bpm. Predominant underlying rhythm was sinus rhythm. Atrial fibrillation is rare; with 1% burden.  One episode of AF RVR maximum rate 175 bpm and duration approaching 4 hours. Isolated PACs were rare (<1.0%). Isolated PVCs were rare (<1.0%). No evidence of significant heart block. Triggered and diary events associated with sinus rhythm and atrial fibrillation.  Symptomatic, rare, atrial fibrillation with a rapid ventricular response.   CT SCANS  CT CORONARY MORPH W/CTA COR W/SCORE 11/03/2020  Addendum 11/03/2020  9:50 PM ADDENDUM REPORT: 11/03/2020 21:48  CLINICAL DATA:  Chest pain  EXAM: Cardiac/Coronary CTA  TECHNIQUE: A non-contrast, gated CT scan was obtained with axial slices of 3 mm through the heart for calcium scoring. Calcium scoring was performed using the Agatston method. A 120 kV prospective, gated, contrast cardiac scan was obtained. Gantry rotation speed was 250 msecs and collimation was 0.6 mm. Two sublingual nitroglycerin  tablets (0.8 mg) were given. The 3D data set was reconstructed in 5% intervals of the 35-75% of the R-R cycle. Diastolic phases were analyzed on a dedicated workstation using MPR, MIP, and VRT modes. The patient received 95 cc of contrast.  FINDINGS: Image quality: Excellent.  Noise artifact is: Limited.  Coronary Arteries:  Normal coronary origin.  Right dominance.  Left main: The left  main is a large caliber vessel with a normal take off from the left coronary cusp that bifurcates to form a left anterior descending artery and a left circumflex artery. There is no plaque or stenosis.  Left anterior descending artery: The LAD is patent without evidence of plaque or stenosis. The LAD gives off 2 patent diagonal branches.  Left circumflex artery: The LCX is non-dominant and patent with no evidence of plaque or stenosis. The LCX gives off 2 patent obtuse marginal branches.  Right coronary artery: The RCA is dominant with normal take off from the right coronary cusp. There is no evidence of plaque or stenosis. The RCA terminates as a PDA and right posterolateral branch without evidence of  plaque or stenosis.  Right Atrium: Right atrial size is within normal limits.  Right Ventricle: The right ventricular cavity is within normal limits.  Left Atrium: Left atrial size is normal in size with no left atrial appendage filling defect.  Left Ventricle: The ventricular cavity size is within normal limits. There are no stigmata of prior infarction. There is no abnormal filling defect.  Pulmonary arteries: Normal in size without proximal filling defect.  Pulmonary veins: Normal pulmonary venous drainage.  Pericardium: Normal thickness with no significant effusion or calcium present.  Cardiac valves: The aortic valve is trileaflet without significant calcification. The mitral valve is normal structure without significant calcification.  Aorta: Normal caliber with no significant disease.  Extra-cardiac findings: See attached radiology report for non-cardiac structures.  IMPRESSION: 1. Coronary calcium score of 0. This was 0 percentile for age-, sex, and race-matched controls.  2. Normal coronary origin with right dominance.  3. Normal coronary arteries.  CAD RADS 0.  RECOMMENDATIONS: 1. CAD-RADS 0: No evidence of CAD (0%). Consider non-atherosclerotic causes  of chest pain.  2. CAD-RADS 1: Minimal non-obstructive CAD (0-24%). Consider non-atherosclerotic causes of chest pain. Consider preventive therapy and risk factor modification.  3. CAD-RADS 2: Mild non-obstructive CAD (25-49%). Consider non-atherosclerotic causes of chest pain. Consider preventive therapy and risk factor modification.  4. CAD-RADS 3: Moderate stenosis. Consider symptom-guided anti-ischemic pharmacotherapy as well as risk factor modification per guideline directed care. Additional analysis with CT FFR will be submitted.  5. CAD-RADS 4: Severe stenosis. (70-99% or > 50% left main). Cardiac catheterization or CT FFR is recommended. Consider symptom-guided anti-ischemic pharmacotherapy as well as risk factor modification per guideline directed care. Invasive coronary angiography recommended with revascularization per published guideline statements.  6. CAD-RADS 5: Total coronary occlusion (100%). Consider cardiac catheterization or viability assessment. Consider symptom-guided anti-ischemic pharmacotherapy as well as risk factor modification per guideline directed care.  7. CAD-RADS N: Non-diagnostic study. Obstructive CAD can't be excluded. Alternative evaluation is recommended.  Wilbert Bihari, MD   Electronically Signed By: Wilbert Bihari M.D. On: 11/03/2020 21:48  Narrative EXAM: OVER-READ INTERPRETATION  CT CHEST  The following report is an over-read performed by radiologist Dr. Juliene Balder of [facility] on 11/03/2020. This over-read does not include interpretation of cardiac or coronary anatomy or pathology. The coronary calcium score/coronary CTA interpretation by the cardiologist is attached.  COMPARISON:  None.  FINDINGS: Vascular: Normal caliber of the visualized thoracic aorta.  Mediastinum/Nodes: Visualized mediastinal structures are normal.  Lungs/Pleura: 4 mm nodule at the left lung base on sequence 11, image 49. No pleural  effusions. No large areas of airspace disease or lung consolidation.  Upper Abdomen: Left hepatic lobe may be prominent but this is incompletely evaluated.  Musculoskeletal: No acute bone abnormality.  IMPRESSION: 1. No acute extracardiac findings. 2. Indeterminate 4 mm nodule at the left lung base. No follow-up needed if patient is low-risk. Non-contrast chest CT can be considered in 12 months if patient is high-risk. This recommendation follows the consensus statement: Guidelines for Management of Incidental Pulmonary Nodules Detected on CT Images: From the Fleischner Society 2017; Radiology 2017; 284:228-243.  Electronically Signed: By: Juliene Balder M.D. On: 11/03/2020 16:58     ______________________________________________________________________________________________       Physical Exam:    VS:  BP (!) 147/84   Pulse 65   Ht 5' 5 (1.651 m)   Wt 286 lb (129.7 kg)   SpO2 96%   BMI 47.59 kg/m    Wt  Readings from Last 3 Encounters:  12/13/23 286 lb (129.7 kg)  11/03/23 285 lb 6.4 oz (129.5 kg)  09/15/23 277 lb 12.8 oz (126 kg)    Gen: no distress, Morbid obesity   Neck: No JVD Cardiac: No Rubs or Gallops, systolic murmur, RRR +2 Respiratory: Clear to auscultation bilaterally, normal effort, normal  respiratory rate GI: Soft, nontender, non-distended  MS: No  edema;  moves all extremities Integument: Skin feels warm Neuro:  At time of evaluation, alert and oriented to person/place/time/situation  Psych: Normal affect, patient feels ok    ASSESSMENT AND PLAN: .    Paroxysmal atrial fibrillation, symptomatic Symptomatic paroxysmal atrial fibrillation with episodes lasting up to five hours, occurring at least twice a week. Symptoms include shortness of breath and dizziness. No significant changes in lifestyle or environmental factors. Well-controlled obstructive sleep apnea with CPAP. Considering antiarrhythmic therapy due to escalating symptoms. Flecainide   chosen for its efficacy and tolerability, with a low incidence of side effects. Stress test planned post-flecainide  initiation to ensure safety. Electrophysiologist referral for potential ablation if symptoms persist. - Started flecainide  50 mg PO BID. - Ordered EKG in one to two weeks for follow-up. - Scheduled stress test in December to assess response to flecainide  (Consented) - Referred to electrophysiologist in January for potential ablation discussion  Essential hypertension - Blood pressure has been elevated on multiple occasions. Current management includes diltiazem , which controls rate but not rhythm. - If persistent after stress test I will order hydrochlorothiazide  25 mg PO daily and get a BMP in one week.  Obstructive sleep apnea, well controlled with CPAP Obstructive sleep apnea is well controlled with CPAP therapy. Apnea-hypopnea index is less than 1.5 with 100% adherence to CPAP. - Continue CPAP therapy.  Morbid obesity Previous exercise and dietary counseling was not amenable due to busy lifestyle. GLP-1 RA therapy not pursued due to lack of diabetes and well-controlled sleep apnea, though it may be beneficial in the future.  Mild nonrheumatic mitral regurgitation - Noted on echocardiogram.; repeat echo in 2028 unless new issues; no evidence of exercise induced MR  Katlyn f/u in April unless ablation is pursued; she will help me see when I need to see her next   Stanly Leavens, MD FASE [facility] Cardiologist [facility]  [address] [place], [address] [place], [place] [address] [phone]  10:46 AM

## 2023-12-23 ENCOUNTER — Ambulatory Visit: Attending: Cardiovascular Disease | Admitting: *Deleted

## 2023-12-23 VITALS — BP 142/74 | HR 72 | Ht 65.0 in

## 2023-12-23 DIAGNOSIS — Z79899 Other long term (current) drug therapy: Secondary | ICD-10-CM

## 2023-12-23 DIAGNOSIS — Z5181 Encounter for therapeutic drug level monitoring: Secondary | ICD-10-CM

## 2023-12-23 NOTE — Progress Notes (Signed)
   Nurse Visit   Date of Encounter: 12/23/2023 ID: Katherine Lewis, DOB 11-Oct-1970, MRN [id number]  PCP:  Joyce Norleen BROCKS, MD   [facility] Providers Cardiologist:  Stanly DELENA Leavens, MD      Visit Details   VS:  BP (!) 142/74 (BP Location: Right Arm, Patient Position: Sitting, Cuff Size: Large)   Pulse 72   Ht 5' 5 (1.651 m)   SpO2 96%   BMI 47.59 kg/m  , BMI Body mass index is 47.59 kg/m.  Wt Readings from Last 3 Encounters:  12/13/23 286 lb (129.7 kg)  11/03/23 285 lb 6.4 oz (129.5 kg)  09/15/23 277 lb 12.8 oz (126 kg)     Reason for visit: EKG for Flecainide  start Performed today: Vitals, EKG, Provider consulted:Dr. Leavens, and Education; advised pt to contact us  if she continues to see her BP consistently greater than 140/90 Changes (medications, testing, etc.) : NO Changes; continue current plan/current medications Length of Visit: 20 minutes    Medications Adjustments/Labs and Tests Ordered: Orders Placed This Encounter  Procedures   EKG 12-Lead   No orders of the defined types were placed in this encounter.    Signed, Zara Buel Fret, RN  12/23/2023 3:08 PM

## 2023-12-29 ENCOUNTER — Telehealth (HOSPITAL_COMMUNITY): Payer: Self-pay | Admitting: *Deleted

## 2023-12-29 ENCOUNTER — Encounter: Payer: Self-pay | Admitting: Internal Medicine

## 2023-12-29 NOTE — Telephone Encounter (Signed)
Left detailed instructions for ETT.

## 2024-01-02 NOTE — Telephone Encounter (Signed)
 Noted

## 2024-01-05 ENCOUNTER — Ambulatory Visit: Payer: Self-pay | Admitting: Internal Medicine

## 2024-01-05 ENCOUNTER — Ambulatory Visit (HOSPITAL_COMMUNITY)
Admission: RE | Admit: 2024-01-05 | Source: Ambulatory Visit | Attending: Internal Medicine | Admitting: Internal Medicine

## 2024-01-05 DIAGNOSIS — Z5181 Encounter for therapeutic drug level monitoring: Secondary | ICD-10-CM | POA: Diagnosis present

## 2024-01-05 DIAGNOSIS — I480 Paroxysmal atrial fibrillation: Secondary | ICD-10-CM | POA: Insufficient documentation

## 2024-01-05 DIAGNOSIS — Z79899 Other long term (current) drug therapy: Secondary | ICD-10-CM | POA: Diagnosis present

## 2024-01-05 LAB — EXERCISE TOLERANCE TEST
Angina Index: 0
Duke Treadmill Score: 4
Estimated workload: 5.2
Exercise duration (min): 3 min
Exercise duration (sec): 30 s
MPHR: 167 {beats}/min
Peak HR: 137 {beats}/min
Percent HR: 82 %
Rest HR: 76 {beats}/min
ST Depression (mm): 0 mm

## 2024-01-24 ENCOUNTER — Ambulatory Visit: Admitting: Cardiology

## 2024-02-24 ENCOUNTER — Ambulatory Visit: Admitting: Student in an Organized Health Care Education/Training Program

## 2024-02-24 ENCOUNTER — Telehealth: Payer: Self-pay

## 2024-02-24 NOTE — Telephone Encounter (Signed)
 Spoke with pt and advised Dr Almetta is in surgery this morning and will need to reschedule pt.  Will have scheduler contact for new appointment.  Pt verbalizes understanding and agrees with current plan.

## 2024-04-24 ENCOUNTER — Ambulatory Visit: Admitting: Cardiology
